# Patient Record
Sex: Male | Born: 1940 | Race: White | Hispanic: No | Marital: Married | Smoking: Former smoker
Health system: Southern US, Community
[De-identification: ages and names within clinical notes are randomized; demographics above are authoritative.]

## PROBLEM LIST (undated history)

## (undated) DIAGNOSIS — M1990 Unspecified osteoarthritis, unspecified site: Secondary | ICD-10-CM

## (undated) DIAGNOSIS — Z8739 Personal history of other diseases of the musculoskeletal system and connective tissue: Secondary | ICD-10-CM

## (undated) DIAGNOSIS — I10 Essential (primary) hypertension: Secondary | ICD-10-CM

## (undated) DIAGNOSIS — E785 Hyperlipidemia, unspecified: Secondary | ICD-10-CM

## (undated) DIAGNOSIS — E119 Type 2 diabetes mellitus without complications: Secondary | ICD-10-CM

## (undated) DIAGNOSIS — I4891 Unspecified atrial fibrillation: Secondary | ICD-10-CM

## (undated) HISTORY — DX: Hyperlipidemia, unspecified: E78.5

## (undated) HISTORY — DX: Personal history of other diseases of the musculoskeletal system and connective tissue: Z87.39

## (undated) HISTORY — DX: Unspecified osteoarthritis, unspecified site: M19.90

## (undated) HISTORY — PX: CHOLECYSTECTOMY: SHX55

## (undated) HISTORY — DX: Unspecified atrial fibrillation: I48.91

## (undated) HISTORY — PX: TONSILLECTOMY AND ADENOIDECTOMY: SUR1326

## (undated) HISTORY — PX: CERVICAL DISCECTOMY: SHX98

---

## 2016-01-22 DIAGNOSIS — E119 Type 2 diabetes mellitus without complications: Secondary | ICD-10-CM

## 2016-01-22 DIAGNOSIS — B351 Tinea unguium: Secondary | ICD-10-CM | POA: Insufficient documentation

## 2016-01-22 DIAGNOSIS — E1142 Type 2 diabetes mellitus with diabetic polyneuropathy: Secondary | ICD-10-CM

## 2016-05-23 DIAGNOSIS — I10 Essential (primary) hypertension: Secondary | ICD-10-CM | POA: Insufficient documentation

## 2016-05-23 DIAGNOSIS — M159 Polyosteoarthritis, unspecified: Secondary | ICD-10-CM | POA: Insufficient documentation

## 2016-05-23 DIAGNOSIS — E782 Mixed hyperlipidemia: Secondary | ICD-10-CM | POA: Insufficient documentation

## 2016-05-23 DIAGNOSIS — Z79899 Other long term (current) drug therapy: Secondary | ICD-10-CM | POA: Insufficient documentation

## 2016-05-23 DIAGNOSIS — M2041 Other hammer toe(s) (acquired), right foot: Secondary | ICD-10-CM | POA: Insufficient documentation

## 2016-05-23 DIAGNOSIS — G629 Polyneuropathy, unspecified: Secondary | ICD-10-CM | POA: Insufficient documentation

## 2016-05-23 DIAGNOSIS — M1A9XX0 Chronic gout, unspecified, without tophus (tophi): Secondary | ICD-10-CM | POA: Insufficient documentation

## 2016-05-23 DIAGNOSIS — E559 Vitamin D deficiency, unspecified: Secondary | ICD-10-CM | POA: Insufficient documentation

## 2018-12-12 DIAGNOSIS — E860 Dehydration: Secondary | ICD-10-CM | POA: Diagnosis not present

## 2018-12-12 DIAGNOSIS — R531 Weakness: Secondary | ICD-10-CM | POA: Diagnosis not present

## 2018-12-12 DIAGNOSIS — R739 Hyperglycemia, unspecified: Secondary | ICD-10-CM

## 2018-12-12 DIAGNOSIS — M5412 Radiculopathy, cervical region: Secondary | ICD-10-CM | POA: Diagnosis not present

## 2018-12-13 DIAGNOSIS — M5412 Radiculopathy, cervical region: Secondary | ICD-10-CM | POA: Diagnosis not present

## 2018-12-13 DIAGNOSIS — R531 Weakness: Secondary | ICD-10-CM | POA: Diagnosis not present

## 2018-12-13 DIAGNOSIS — R739 Hyperglycemia, unspecified: Secondary | ICD-10-CM | POA: Diagnosis not present

## 2018-12-13 DIAGNOSIS — E860 Dehydration: Secondary | ICD-10-CM | POA: Diagnosis not present

## 2018-12-14 DIAGNOSIS — R739 Hyperglycemia, unspecified: Secondary | ICD-10-CM | POA: Diagnosis not present

## 2018-12-14 DIAGNOSIS — R531 Weakness: Secondary | ICD-10-CM | POA: Diagnosis not present

## 2018-12-14 DIAGNOSIS — E860 Dehydration: Secondary | ICD-10-CM | POA: Diagnosis not present

## 2018-12-14 DIAGNOSIS — M5412 Radiculopathy, cervical region: Secondary | ICD-10-CM | POA: Diagnosis not present

## 2018-12-15 DIAGNOSIS — R531 Weakness: Secondary | ICD-10-CM | POA: Diagnosis not present

## 2018-12-15 DIAGNOSIS — R739 Hyperglycemia, unspecified: Secondary | ICD-10-CM | POA: Diagnosis not present

## 2018-12-15 DIAGNOSIS — M5412 Radiculopathy, cervical region: Secondary | ICD-10-CM | POA: Diagnosis not present

## 2018-12-15 DIAGNOSIS — E860 Dehydration: Secondary | ICD-10-CM | POA: Diagnosis not present

## 2018-12-16 DIAGNOSIS — M5412 Radiculopathy, cervical region: Secondary | ICD-10-CM | POA: Diagnosis not present

## 2018-12-16 DIAGNOSIS — E860 Dehydration: Secondary | ICD-10-CM | POA: Diagnosis not present

## 2018-12-16 DIAGNOSIS — R739 Hyperglycemia, unspecified: Secondary | ICD-10-CM | POA: Diagnosis not present

## 2018-12-16 DIAGNOSIS — R531 Weakness: Secondary | ICD-10-CM | POA: Diagnosis not present

## 2018-12-27 ENCOUNTER — Inpatient Hospital Stay (HOSPITAL_COMMUNITY)
Admission: EM | Admit: 2018-12-27 | Discharge: 2018-12-31 | DRG: 028 | Disposition: A | Discharge status: Skilled Nursing Facility | Payer: Medicare HMO | Source: Skilled Nursing Facility | Attending: Neurosurgery | Admitting: Neurosurgery

## 2018-12-27 ENCOUNTER — Encounter (HOSPITAL_COMMUNITY): Payer: Self-pay

## 2018-12-27 ENCOUNTER — Other Ambulatory Visit: Payer: Self-pay

## 2018-12-27 ENCOUNTER — Emergency Department (HOSPITAL_COMMUNITY): Payer: Medicare HMO | Admitting: Anesthesiology

## 2018-12-27 ENCOUNTER — Encounter (HOSPITAL_COMMUNITY)
Admission: EM | Disposition: A | Discharge status: Skilled Nursing Facility | Payer: Self-pay | Source: Skilled Nursing Facility | Attending: Neurosurgery

## 2018-12-27 ENCOUNTER — Emergency Department (HOSPITAL_COMMUNITY): Payer: Medicare HMO

## 2018-12-27 DIAGNOSIS — I10 Essential (primary) hypertension: Secondary | ICD-10-CM | POA: Diagnosis present

## 2018-12-27 DIAGNOSIS — W1830XA Fall on same level, unspecified, initial encounter: Secondary | ICD-10-CM | POA: Diagnosis present

## 2018-12-27 DIAGNOSIS — E1165 Type 2 diabetes mellitus with hyperglycemia: Secondary | ICD-10-CM | POA: Diagnosis present

## 2018-12-27 DIAGNOSIS — Z7984 Long term (current) use of oral hypoglycemic drugs: Secondary | ICD-10-CM

## 2018-12-27 DIAGNOSIS — Z7982 Long term (current) use of aspirin: Secondary | ICD-10-CM

## 2018-12-27 DIAGNOSIS — Z419 Encounter for procedure for purposes other than remedying health state, unspecified: Secondary | ICD-10-CM

## 2018-12-27 DIAGNOSIS — S14123A Central cord syndrome at C3 level of cervical spinal cord, initial encounter: Secondary | ICD-10-CM | POA: Diagnosis not present

## 2018-12-27 DIAGNOSIS — G8250 Quadriplegia, unspecified: Secondary | ICD-10-CM | POA: Diagnosis present

## 2018-12-27 DIAGNOSIS — Z791 Long term (current) use of non-steroidal anti-inflammatories (NSAID): Secondary | ICD-10-CM

## 2018-12-27 DIAGNOSIS — G959 Disease of spinal cord, unspecified: Secondary | ICD-10-CM | POA: Diagnosis present

## 2018-12-27 DIAGNOSIS — Z79899 Other long term (current) drug therapy: Secondary | ICD-10-CM

## 2018-12-27 DIAGNOSIS — M4802 Spinal stenosis, cervical region: Secondary | ICD-10-CM | POA: Diagnosis not present

## 2018-12-27 DIAGNOSIS — I480 Paroxysmal atrial fibrillation: Secondary | ICD-10-CM | POA: Diagnosis present

## 2018-12-27 DIAGNOSIS — R0602 Shortness of breath: Secondary | ICD-10-CM

## 2018-12-27 DIAGNOSIS — Z981 Arthrodesis status: Secondary | ICD-10-CM

## 2018-12-27 DIAGNOSIS — M48062 Spinal stenosis, lumbar region with neurogenic claudication: Secondary | ICD-10-CM | POA: Diagnosis present

## 2018-12-27 DIAGNOSIS — G2581 Restless legs syndrome: Secondary | ICD-10-CM | POA: Diagnosis present

## 2018-12-27 HISTORY — PX: POSTERIOR CERVICAL LAMINECTOMY: SHX2248

## 2018-12-27 HISTORY — DX: Type 2 diabetes mellitus without complications: E11.9

## 2018-12-27 HISTORY — DX: Essential (primary) hypertension: I10

## 2018-12-27 LAB — URINALYSIS, ROUTINE W REFLEX MICROSCOPIC
Bilirubin Urine: NEGATIVE
Glucose, UA: 50 mg/dL — AB
Hgb urine dipstick: NEGATIVE
Ketones, ur: 5 mg/dL — AB
Nitrite: NEGATIVE
Protein, ur: NEGATIVE mg/dL
Specific Gravity, Urine: 1.014 (ref 1.005–1.030)
pH: 5 (ref 5.0–8.0)

## 2018-12-27 LAB — BASIC METABOLIC PANEL
ANION GAP: 13 (ref 5–15)
BUN: 21 mg/dL (ref 8–23)
CALCIUM: 8.8 mg/dL — AB (ref 8.9–10.3)
CO2: 19 mmol/L — ABNORMAL LOW (ref 22–32)
Chloride: 104 mmol/L (ref 98–111)
Creatinine, Ser: 1 mg/dL (ref 0.61–1.24)
GFR calc Af Amer: >60 mL/min (ref >60)
GFR calc non Af Amer: >60 mL/min (ref >60)
Glucose, Bld: 273 mg/dL — ABNORMAL HIGH (ref 70–99)
Potassium: 3.5 mmol/L (ref 3.5–5.1)
Sodium: 136 mmol/L (ref 135–145)

## 2018-12-27 LAB — CBC
HCT: 38.8 % — ABNORMAL LOW (ref 39.0–52.0)
Hemoglobin: 12.2 g/dL — ABNORMAL LOW (ref 13.0–17.0)
MCH: 30 pg (ref 26.0–34.0)
MCHC: 31.4 g/dL (ref 30.0–36.0)
MCV: 95.6 fL (ref 80.0–100.0)
Platelets: 323 10*3/uL (ref 150–400)
RBC: 4.06 MIL/uL — ABNORMAL LOW (ref 4.22–5.81)
RDW: 12.4 % (ref 11.5–15.5)
WBC: 10.4 10*3/uL (ref 4.0–10.5)
nRBC: 0 % (ref 0.0–0.2)

## 2018-12-27 LAB — AMMONIA: Ammonia: 10 umol/L (ref 9–35)

## 2018-12-27 LAB — SEDIMENTATION RATE: Sed Rate: 55 mm/hr — ABNORMAL HIGH (ref 0–16)

## 2018-12-27 SURGERY — POSTERIOR CERVICAL LAMINECTOMY
Anesthesia: General | Site: Neck

## 2018-12-27 MED ORDER — SODIUM CHLORIDE 0.9% FLUSH
3.0000 mL | Freq: Once | INTRAVENOUS | Status: AC
  Administered 2018-12-27: 3 mL via INTRAVENOUS

## 2018-12-27 MED ORDER — LIDOCAINE 2% (20 MG/ML) 5 ML SYRINGE
INTRAMUSCULAR | Status: AC
  Filled 2018-12-27: qty 5

## 2018-12-27 MED ORDER — PROPOFOL 10 MG/ML IV BOLUS
INTRAVENOUS | Status: AC
  Filled 2018-12-27: qty 20

## 2018-12-27 MED ORDER — FENTANYL CITRATE (PF) 100 MCG/2ML IJ SOLN
INTRAMUSCULAR | Status: DC | PRN
  Administered 2018-12-27: 100 ug via INTRAVENOUS

## 2018-12-27 MED ORDER — THROMBIN 5000 UNITS EX SOLR
CUTANEOUS | Status: AC
  Filled 2018-12-27: qty 5000

## 2018-12-27 MED ORDER — ROCURONIUM BROMIDE 50 MG/5ML IV SOSY
PREFILLED_SYRINGE | INTRAVENOUS | Status: AC
  Filled 2018-12-27: qty 5

## 2018-12-27 MED ORDER — GADOBUTROL 1 MMOL/ML IV SOLN
10.0000 mL | Freq: Once | INTRAVENOUS | Status: AC | PRN
  Administered 2018-12-27: 10 mL via INTRAVENOUS

## 2018-12-27 MED ORDER — ROCURONIUM BROMIDE 50 MG/5ML IV SOSY
PREFILLED_SYRINGE | INTRAVENOUS | Status: DC | PRN
  Administered 2018-12-27: 50 mg via INTRAVENOUS
  Administered 2018-12-28: 20 mg via INTRAVENOUS
  Administered 2018-12-28: 30 mg via INTRAVENOUS

## 2018-12-27 MED ORDER — LIDOCAINE-EPINEPHRINE 1 %-1:100000 IJ SOLN
INTRAMUSCULAR | Status: AC
  Filled 2018-12-27: qty 1

## 2018-12-27 MED ORDER — LIDOCAINE HCL (CARDIAC) PF 100 MG/5ML IV SOSY
PREFILLED_SYRINGE | INTRAVENOUS | Status: DC | PRN
  Administered 2018-12-27: 60 mg via INTRAVENOUS

## 2018-12-27 MED ORDER — SUCCINYLCHOLINE CHLORIDE 200 MG/10ML IV SOSY
PREFILLED_SYRINGE | INTRAVENOUS | Status: AC
  Filled 2018-12-27: qty 10

## 2018-12-27 MED ORDER — BACITRACIN ZINC 500 UNIT/GM EX OINT
TOPICAL_OINTMENT | CUTANEOUS | Status: AC
  Filled 2018-12-27: qty 28.35

## 2018-12-27 MED ORDER — CEFAZOLIN SODIUM-DEXTROSE 2-3 GM-%(50ML) IV SOLR
INTRAVENOUS | Status: DC | PRN
  Administered 2018-12-27: 2 g via INTRAVENOUS

## 2018-12-27 MED ORDER — PHENYLEPHRINE HCL 10 MG/ML IJ SOLN
INTRAMUSCULAR | Status: DC | PRN
  Administered 2018-12-27 – 2018-12-28 (×8): 80 ug via INTRAVENOUS

## 2018-12-27 MED ORDER — PROPOFOL 10 MG/ML IV BOLUS
INTRAVENOUS | Status: DC | PRN
  Administered 2018-12-27: 90 mg via INTRAVENOUS
  Administered 2018-12-27: 30 mg via INTRAVENOUS

## 2018-12-27 MED ORDER — FENTANYL CITRATE (PF) 250 MCG/5ML IJ SOLN
INTRAMUSCULAR | Status: AC
  Filled 2018-12-27: qty 5

## 2018-12-27 MED ORDER — BUPIVACAINE HCL (PF) 0.25 % IJ SOLN
INTRAMUSCULAR | Status: AC
  Filled 2018-12-27: qty 30

## 2018-12-27 SURGICAL SUPPLY — 77 items
BAG DECANTER FOR FLEXI CONT (MISCELLANEOUS) ×3 IMPLANT
BENZOIN TINCTURE PRP APPL 2/3 (GAUZE/BANDAGES/DRESSINGS) ×3 IMPLANT
BIT DRILL 3.5 SHORT (BIT) ×2 IMPLANT
BIT DRILL 3.5MM SHORT (BIT) ×1
BLADE CLIPPER SURG (BLADE) ×3 IMPLANT
BLADE SURG 11 STRL SS (BLADE) ×3 IMPLANT
BONE VIVIGEN FORMABLE 10CC (Bone Implant) ×3 IMPLANT
BUR MATCHSTICK NEURO 3.0 LAGG (BURR) ×3 IMPLANT
CANISTER SUCT 3000ML PPV (MISCELLANEOUS) ×3 IMPLANT
CAP LOCKING (Cap) ×8 IMPLANT
CARTRIDGE OIL MAESTRO DRILL (MISCELLANEOUS) ×1 IMPLANT
CATH FOLEY 2WAY SLVR  5CC 12FR (CATHETERS) ×2
CATH FOLEY 2WAY SLVR 5CC 12FR (CATHETERS) ×1 IMPLANT
CLOSURE STERI-STRIP 1/2X4 (GAUZE/BANDAGES/DRESSINGS) ×1
CLOSURE WOUND 1/2 X4 (GAUZE/BANDAGES/DRESSINGS) ×1
CLSR STERI-STRIP ANTIMIC 1/2X4 (GAUZE/BANDAGES/DRESSINGS) ×2 IMPLANT
COVER WAND RF STERILE (DRAPES) ×3 IMPLANT
DECANTER SPIKE VIAL GLASS SM (MISCELLANEOUS) ×3 IMPLANT
DERMABOND ADVANCED (GAUZE/BANDAGES/DRESSINGS) ×2
DERMABOND ADVANCED .7 DNX12 (GAUZE/BANDAGES/DRESSINGS) ×1 IMPLANT
DIFFUSER DRILL AIR PNEUMATIC (MISCELLANEOUS) ×3 IMPLANT
DRAPE C-ARM 42X72 X-RAY (DRAPES) ×6 IMPLANT
DRAPE LAPAROTOMY 100X72 PEDS (DRAPES) ×3 IMPLANT
DRAPE MICROSCOPE LEICA (MISCELLANEOUS) IMPLANT
DRAPE SURG 17X23 STRL (DRAPES) ×3 IMPLANT
DRSG OPSITE POSTOP 4X6 (GAUZE/BANDAGES/DRESSINGS) ×3 IMPLANT
DURAPREP 26ML APPLICATOR (WOUND CARE) ×3 IMPLANT
ELECT REM PT RETURN 9FT ADLT (ELECTROSURGICAL) ×3
ELECTRODE REM PT RTRN 9FT ADLT (ELECTROSURGICAL) ×1 IMPLANT
EVACUATOR 1/8 PVC DRAIN (DRAIN) ×3 IMPLANT
GAUZE 4X4 16PLY RFD (DISPOSABLE) IMPLANT
GAUZE SPONGE 4X4 12PLY STRL (GAUZE/BANDAGES/DRESSINGS) ×3 IMPLANT
GLOVE BIO SURGEON STRL SZ 6.5 (GLOVE) ×2 IMPLANT
GLOVE BIO SURGEON STRL SZ7 (GLOVE) ×12 IMPLANT
GLOVE BIO SURGEON STRL SZ8 (GLOVE) ×3 IMPLANT
GLOVE BIO SURGEONS STRL SZ 6.5 (GLOVE) ×1
GLOVE BIOGEL PI IND STRL 7.0 (GLOVE) IMPLANT
GLOVE BIOGEL PI IND STRL 7.5 (GLOVE) ×2 IMPLANT
GLOVE BIOGEL PI INDICATOR 7.0 (GLOVE)
GLOVE BIOGEL PI INDICATOR 7.5 (GLOVE) ×4
GLOVE EXAM NITRILE XL STR (GLOVE) IMPLANT
GLOVE INDICATOR 8.5 STRL (GLOVE) ×3 IMPLANT
GOWN STRL REUS W/ TWL LRG LVL3 (GOWN DISPOSABLE) IMPLANT
GOWN STRL REUS W/ TWL XL LVL3 (GOWN DISPOSABLE) ×1 IMPLANT
GOWN STRL REUS W/TWL 2XL LVL3 (GOWN DISPOSABLE) IMPLANT
GOWN STRL REUS W/TWL LRG LVL3 (GOWN DISPOSABLE)
GOWN STRL REUS W/TWL XL LVL3 (GOWN DISPOSABLE) ×2
IMPL QUARTEX 3.5X14MM (Neuro Prosthesis/Implant) ×8 IMPLANT
IMPLANT QUARTEX 3.5X14MM (Neuro Prosthesis/Implant) ×24 IMPLANT
KIT BASIN OR (CUSTOM PROCEDURE TRAY) ×3 IMPLANT
KIT INFUSE X SMALL 1.4CC (Orthopedic Implant) ×3 IMPLANT
KIT TURNOVER KIT B (KITS) ×3 IMPLANT
LOCKING CAP (Cap) ×24 IMPLANT
MARKER SKIN DUAL TIP RULER LAB (MISCELLANEOUS) ×3 IMPLANT
NEEDLE HYPO 25X1 1.5 SAFETY (NEEDLE) ×3 IMPLANT
NEEDLE SPNL 20GX3.5 QUINCKE YW (NEEDLE) ×3 IMPLANT
NS IRRIG 1000ML POUR BTL (IV SOLUTION) ×3 IMPLANT
OIL CARTRIDGE MAESTRO DRILL (MISCELLANEOUS) ×3
PACK LAMINECTOMY NEURO (CUSTOM PROCEDURE TRAY) ×3 IMPLANT
PAD ARMBOARD 7.5X6 YLW CONV (MISCELLANEOUS) ×9 IMPLANT
PIN MAYFIELD SKULL DISP (PIN) ×3 IMPLANT
ROD SPINE CVD 3.5X60MM (Rod) ×6 IMPLANT
RUBBERBAND STERILE (MISCELLANEOUS) IMPLANT
SPONGE LAP 4X18 RFD (DISPOSABLE) IMPLANT
SPONGE SURGIFOAM ABS GEL SZ50 (HEMOSTASIS) ×3 IMPLANT
STRIP CLOSURE SKIN 1/2X4 (GAUZE/BANDAGES/DRESSINGS) ×2 IMPLANT
SUT ETHILON 4 0 PS 2 18 (SUTURE) ×3 IMPLANT
SUT VIC AB 0 CT1 18XCR BRD8 (SUTURE) ×1 IMPLANT
SUT VIC AB 0 CT1 8-18 (SUTURE) ×2
SUT VIC AB 2-0 CT1 18 (SUTURE) ×3 IMPLANT
SUT VICRYL 4-0 PS2 18IN ABS (SUTURE) ×3 IMPLANT
TOWEL GREEN STERILE (TOWEL DISPOSABLE) ×3 IMPLANT
TOWEL GREEN STERILE FF (TOWEL DISPOSABLE) ×3 IMPLANT
TRAY FOLEY MTR SLVR 16FR STAT (SET/KITS/TRAYS/PACK) ×3 IMPLANT
TUBE CONNECTING 20'X1/4 (TUBING) ×1
TUBE CONNECTING 20X1/4 (TUBING) ×2 IMPLANT
WATER STERILE IRR 1000ML POUR (IV SOLUTION) ×3 IMPLANT

## 2018-12-27 NOTE — H&P (Signed)
Ralph Griffin is an 78 y.o. male.   Chief Complaint: progressive quadriparesis following a fall late January HPI: 78 year old gentleman who underwent an ACDF from C3-C7 back in early November. Did very well until mid late January sustained a fall and since then his Progressively weaker. Yet she was admitted for weakness in the beginning of February to [facility] worked up extensively and transferred to rehabilitation. Patient has been followed up by his spine surgeon and workup has included brain MRI and CT scans have not shown any significant structural pathology. Patient is continue with rehabilitation however over the last 9 days his exam has been stable and he's been nonambulatory with significant weakness in his distal upper extremities and his lower extremities.  Past Medical History:  Diagnosis Date  . Diabetes mellitus without complication ([place])   . Hypertension     Past Surgical History:  Procedure Laterality Date  . CERVICAL DISCECTOMY      History reviewed. No pertinent family history. Social History:  reports that he has never smoked. He has never used smokeless tobacco. He reports previous alcohol use. No history on file for drug.  Allergies: No Known Allergies  (Not in a hospital admission)   Results for orders placed or performed during the hospital encounter of 12/27/18 (from the past 48 hour(s))  Basic metabolic panel     Status: Abnormal   Collection Time: 12/27/18  1:50 PM  Result Value Ref Range   Sodium 136 135 - 145 mmol/L   Potassium 3.5 3.5 - 5.1 mmol/L   Chloride 104 98 - 111 mmol/L   CO2 19 (L) 22 - 32 mmol/L   Glucose, Bld 273 (H) 70 - 99 mg/dL   BUN 21 8 - 23 mg/dL   Creatinine, Ser 1.00 0.61 - 1.24 mg/dL   Calcium 8.8 (L) 8.9 - 10.3 mg/dL   GFR calc non Af Amer >60 >60 mL/min   GFR calc Af Amer >60 >60 mL/min   Anion gap 13 5 - 15    Comment: Performed at [facility], [place] [address], [place], [place] [address]  CBC     Status:  Abnormal   Collection Time: 12/27/18  1:50 PM  Result Value Ref Range   WBC 10.4 4.0 - 10.5 K/uL   RBC 4.06 (L) 4.22 - 5.81 MIL/uL   Hemoglobin 12.2 (L) 13.0 - 17.0 g/dL   HCT 38.8 (L) 39.0 - 52.0 %   MCV 95.6 80.0 - 100.0 fL   MCH 30.0 26.0 - 34.0 pg   MCHC 31.4 30.0 - 36.0 g/dL   RDW 12.4 11.5 - 15.5 %   Platelets 323 150 - 400 K/uL   nRBC 0.0 0.0 - 0.2 %    Comment: Performed at [facility], [place] [address] [place], [place] [address]  Sedimentation rate     Status: Abnormal   Collection Time: 12/27/18  4:22 PM  Result Value Ref Range   Sed Rate 55 (H) 0 - 16 mm/hr    Comment: Performed at [facility] [address] [place], [place] [address]  Ammonia     Status: None   Collection Time: 12/27/18  5:02 PM  Result Value Ref Range   Ammonia 10 9 - 35 umol/L    Comment: Performed at [facility], [place] [address], [place], [place] [address]  Urinalysis, Routine w reflex microscopic     Status: Abnormal   Collection Time: 12/27/18  6:50 PM  Result  Value Ref Range   Color, Urine YELLOW YELLOW   APPearance HAZY (A) CLEAR   Specific Gravity, Urine 1.014 1.005 - 1.030   pH 5.0 5.0 - 8.0   Glucose, UA 50 (A) NEGATIVE mg/dL   Hgb urine dipstick NEGATIVE NEGATIVE   Bilirubin Urine NEGATIVE NEGATIVE   Ketones, ur 5 (A) NEGATIVE mg/dL   Protein, ur NEGATIVE NEGATIVE mg/dL   Nitrite NEGATIVE NEGATIVE   Leukocytes,Ua MODERATE (A) NEGATIVE   RBC / HPF 6-10 0 - 5 RBC/hpf   WBC, UA 11-20 0 - 5 WBC/hpf   Bacteria, UA RARE (A) NONE SEEN   Squamous Epithelial / LPF 0-5 0 - 5   Mucus PRESENT    Hyaline Casts, UA PRESENT     Comment: Performed at [facility] [address] [place], [place] [address]   Mr Cervical Spine W Or Wo Contrast  Result Date: 12/27/2018 CLINICAL DATA:  Initial evaluation for acute and progressive myelopathy. EXAM: MRI CERVICAL AND THORACIC SPINE WITHOUT AND WITH CONTRAST TECHNIQUE: Multiplanar and multiecho pulse sequences of the cervical  spine, to include the craniocervical junction and cervicothoracic junction, and the thoracic spine, were obtained without and with intravenous contrast. CONTRAST:  10 cc of Gadavist. COMPARISON:  None available. FINDINGS: MRI CERVICAL SPINE FINDINGS Alignment: Vertebral bodies normally aligned with preservation of the normal cervical lordosis. No listhesis. Vertebrae: Susceptibility artifact from prior ACDF at C3 through C6. Vertebral body heights maintained without evidence for acute or chronic fracture. Bone marrow signal intensity within normal limits. No discrete or worrisome osseous lesions. Reactive marrow edema with mild enhancement about the right greater than left C4-5 facets, most likely due to facet arthritis. Cord: Evaluation of the cervical spinal cord somewhat limited by body habitus and motion artifact vague T2 signal abnormality within the cervical spinal cord at the level of C3-4, suspicious for edema and/or myelomalacia (series 4, image 10). Possible postcontrast enhancement, although difficult to be certain given adjacent susceptibility artifact from fusion hardware. Signal intensity within the cervical spinal cord otherwise within normal limits. Posterior Fossa, vertebral arteries, paraspinal tissues: Visualized brain and posterior fossa within normal limits. Craniocervical junction normal. Paraspinous and prevertebral soft tissues normal. Normal intravascular flow voids seen within the vertebral arteries bilaterally. Disc levels: C2-C3: Mild disc bulge. Right greater than left uncovertebral hypertrophy. Prominent right-sided facet degeneration. Resultant mild spinal stenosis without cord deformity. Severe right with mild left C3 foraminal narrowing. C3-C4: Prior fusion. Broad-based posterior osseous ridging flattens and effaces the ventral thecal sac. Superimposed facet and ligamentum flavum hypertrophy. Resultant severe spinal stenosis with compression of the cervical spinal cord. Thecal sac  measures approximately 5 mm in AP diameter at its most narrow point. Severe bilateral C4 foraminal stenosis. C4-C5: Prior fusion. Small amount of enhancing T2 hyperintense signal intensity at the right ventral epidural space suspected to reflect postoperative granulation tissue (series 4, image 12). Facet and ligament flavum hypertrophy. Residual mild to moderate spinal stenosis without cord compression. Residual uncovertebral spurring with resultant severe bilateral C5 foraminal stenosis. C5-C6: Prior fusion. Residual posterior osseous ridging flattens and effaces the ventral thecal sac. Residual mild moderate spinal stenosis without cord compression. Severe bilateral C6 foraminal stenosis. C6-C7: Right eccentric disc osteophyte with intervertebral disc space narrowing, with right greater than left uncovertebral hypertrophy. Flattening of the ventral thecal sac without significant spinal stenosis. Moderate right with mild left C7 foraminal narrowing. C7-T1: Mild disc bulge with facet hypertrophy. No significant spinal stenosis. Foramina are grossly patent. MRI THORACIC SPINE  FINDINGS Alignment: Mild exaggeration of the normal thoracic kyphosis. No listhesis or malalignment. Vertebrae: Vertebral body heights maintained without evidence for acute or chronic fracture. Bone marrow signal intensity within normal limits. Multiple scattered benign hemangiomas noted throughout the thoracic spine, most prominent of which measures 2.3 cm at the L1 vertebral body. No worrisome osseous lesions. No abnormal marrow edema or enhancement. Cord: Signal intensity within the thoracic spinal cord is normal. Normal cord caliber morphology. Conus medullaris terminates at approximately the L1 level. Paraspinal and other soft tissues: Paraspinous soft tissues demonstrate no acute finding. Partially visualized lungs are grossly clear. Visualized visceral structures unremarkable. Disc levels: Minimal disc bulge noted at T7-8 without  significant stenosis. Mild scattered posterior element hypertrophy noted within the lower thoracic spine. No other significant degenerative changes identified. No canal or neural foraminal stenosis. No impingement. IMPRESSION: MRI CERVICAL SPINE IMPRESSION 1. Multifactorial degenerative changes at C3-4 with resultant severe spinal stenosis with cord impingement. Subtle T2 signal abnormality within the cord at this level suspicious for edema and/or myelomalacia. 2. Prior ACDF at C3-C6, with residual mild to moderate diffuse spinal stenosis elsewhere at these levels. 3. Multifactorial degenerative changes with resultant severe right C3 foraminal stenosis, with severe bilateral C4 through C6 foraminal narrowing, and moderate right C7 foraminal stenosis. MRI THORACIC SPINE IMPRESSION No acute abnormality within the thoracic spine. No significant stenosis or evidence for cord compression. Critical Value/emergent results were called by telephone at the time of interpretation on 12/27/2018 at 9:25 pm to Dr. Noemi Chapel , who verbally acknowledged these results. Electronically Signed   By: Jeannine Boga M.D.   On: 12/27/2018 21:29   Mr Thoracic Spine W Wo Contrast  Result Date: 12/27/2018 CLINICAL DATA:  Initial evaluation for acute and progressive myelopathy. EXAM: MRI CERVICAL AND THORACIC SPINE WITHOUT AND WITH CONTRAST TECHNIQUE: Multiplanar and multiecho pulse sequences of the cervical spine, to include the craniocervical junction and cervicothoracic junction, and the thoracic spine, were obtained without and with intravenous contrast. CONTRAST:  10 cc of Gadavist. COMPARISON:  None available. FINDINGS: MRI CERVICAL SPINE FINDINGS Alignment: Vertebral bodies normally aligned with preservation of the normal cervical lordosis. No listhesis. Vertebrae: Susceptibility artifact from prior ACDF at C3 through C6. Vertebral body heights maintained without evidence for acute or chronic fracture. Bone marrow signal  intensity within normal limits. No discrete or worrisome osseous lesions. Reactive marrow edema with mild enhancement about the right greater than left C4-5 facets, most likely due to facet arthritis. Cord: Evaluation of the cervical spinal cord somewhat limited by body habitus and motion artifact vague T2 signal abnormality within the cervical spinal cord at the level of C3-4, suspicious for edema and/or myelomalacia (series 4, image 10). Possible postcontrast enhancement, although difficult to be certain given adjacent susceptibility artifact from fusion hardware. Signal intensity within the cervical spinal cord otherwise within normal limits. Posterior Fossa, vertebral arteries, paraspinal tissues: Visualized brain and posterior fossa within normal limits. Craniocervical junction normal. Paraspinous and prevertebral soft tissues normal. Normal intravascular flow voids seen within the vertebral arteries bilaterally. Disc levels: C2-C3: Mild disc bulge. Right greater than left uncovertebral hypertrophy. Prominent right-sided facet degeneration. Resultant mild spinal stenosis without cord deformity. Severe right with mild left C3 foraminal narrowing. C3-C4: Prior fusion. Broad-based posterior osseous ridging flattens and effaces the ventral thecal sac. Superimposed facet and ligamentum flavum hypertrophy. Resultant severe spinal stenosis with compression of the cervical spinal cord. Thecal sac measures approximately 5 mm in AP diameter at its most narrow point. Severe bilateral  C4 foraminal stenosis. C4-C5: Prior fusion. Small amount of enhancing T2 hyperintense signal intensity at the right ventral epidural space suspected to reflect postoperative granulation tissue (series 4, image 12). Facet and ligament flavum hypertrophy. Residual mild to moderate spinal stenosis without cord compression. Residual uncovertebral spurring with resultant severe bilateral C5 foraminal stenosis. C5-C6: Prior fusion. Residual  posterior osseous ridging flattens and effaces the ventral thecal sac. Residual mild moderate spinal stenosis without cord compression. Severe bilateral C6 foraminal stenosis. C6-C7: Right eccentric disc osteophyte with intervertebral disc space narrowing, with right greater than left uncovertebral hypertrophy. Flattening of the ventral thecal sac without significant spinal stenosis. Moderate right with mild left C7 foraminal narrowing. C7-T1: Mild disc bulge with facet hypertrophy. No significant spinal stenosis. Foramina are grossly patent. MRI THORACIC SPINE FINDINGS Alignment: Mild exaggeration of the normal thoracic kyphosis. No listhesis or malalignment. Vertebrae: Vertebral body heights maintained without evidence for acute or chronic fracture. Bone marrow signal intensity within normal limits. Multiple scattered benign hemangiomas noted throughout the thoracic spine, most prominent of which measures 2.3 cm at the L1 vertebral body. No worrisome osseous lesions. No abnormal marrow edema or enhancement. Cord: Signal intensity within the thoracic spinal cord is normal. Normal cord caliber morphology. Conus medullaris terminates at approximately the L1 level. Paraspinal and other soft tissues: Paraspinous soft tissues demonstrate no acute finding. Partially visualized lungs are grossly clear. Visualized visceral structures unremarkable. Disc levels: Minimal disc bulge noted at T7-8 without significant stenosis. Mild scattered posterior element hypertrophy noted within the lower thoracic spine. No other significant degenerative changes identified. No canal or neural foraminal stenosis. No impingement. IMPRESSION: MRI CERVICAL SPINE IMPRESSION 1. Multifactorial degenerative changes at C3-4 with resultant severe spinal stenosis with cord impingement. Subtle T2 signal abnormality within the cord at this level suspicious for edema and/or myelomalacia. 2. Prior ACDF at C3-C6, with residual mild to moderate diffuse  spinal stenosis elsewhere at these levels. 3. Multifactorial degenerative changes with resultant severe right C3 foraminal stenosis, with severe bilateral C4 through C6 foraminal narrowing, and moderate right C7 foraminal stenosis. MRI THORACIC SPINE IMPRESSION No acute abnormality within the thoracic spine. No significant stenosis or evidence for cord compression. Critical Value/emergent results were called by telephone at the time of interpretation on 12/27/2018 at 9:25 pm to Dr. Noemi Chapel , who verbally acknowledged these results. Electronically Signed   By: Jeannine Boga M.D.   On: 12/27/2018 21:29    Review of Systems  Musculoskeletal: Positive for neck pain.  Neurological: Positive for sensory change and weakness.    Blood pressure 132/84, pulse 97, temperature 97.7 F (36.5 C), temperature source Oral, resp. rate 14, SpO2 100 %. Physical Exam  Constitutional: He is oriented to person, place, and time. He appears well-developed and well-nourished.  HENT:  Head: Normocephalic.  Eyes: Pupils are equal, round, and reactive to light.  Neck: Normal range of motion.  Respiratory: Effort normal.  GI: Soft.  Neurological: He is alert and oriented to person, place, and time. He has normal strength. GCS eye subscore is 4. GCS verbal subscore is 5. GCS motor subscore is 6.  Dense quadriparesis upper extremities left upper extremity is the weakest with 4- bicep 4 out of 5 triceps 1-2 grip in the left. The right upper extremity has a week 2-3 grip bicep is 4 triceps 4.lower extremities are antigravity at 4 minus to 4 iliopsoas, quads, hamstrings dorsiflexion's weak bilaterally at about 3 out of 5. 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 MRI of cervical spine is very difficult to interpret due to the artifact  from the anterior construct but I think the vast majority of compression is now happening posteriorly. There does appear to be some anterior compression on the T2 use however this looks less significant on the restroom images and other sequences and possibly could be related to artifact there was nothing on the CT scan he had a Oval Linsey that look like a fracture displacement of bone or graft there may have been some small amount of residual spurring on the right. Due to the high risk of reexploring previously operated level this June out after his anterior cervical approach and a 78 year old and due to the fact that think most of the compression now resides posteriorly I recommended posterior decompression and fusion. I've extensively gone over the risks and benefits of the procedure with him as well as perioperative course expectations of outcome and alternatives surgery and he understands and agrees to proceed forward.  Staley Lunz P, MD 12/27/2018, 10:41 PM

## 2018-12-27 NOTE — Anesthesia Preprocedure Evaluation (Addendum)
Anesthesia Evaluation  Patient identified by MRN, date of birth, ID band Patient awake    Reviewed: Allergy & Precautions, NPO status , Patient's Chart, lab work & pertinent test results, reviewed documented beta blocker date and time   History of Anesthesia Complications Negative for: history of anesthetic complications  Airway Mallampati: I  TM Distance: >3 FB Neck ROM: Limited    Dental  (+) Edentulous Upper, Edentulous Lower   Pulmonary neg pulmonary ROS,    breath sounds clear to auscultation       Cardiovascular hypertension, Pt. on medications and Pt. on home beta blockers (-) angina(-) Past MI and (-) CHF + dysrhythmias Atrial Fibrillation  Rhythm:Irregular     Neuro/Psych Progressive upper/lower extremity weakness after a fall s/p ACDF 11/19 negative psych ROS   GI/Hepatic Neg liver ROS,   Endo/Other  diabetes, Type 2Morbid obesity  Renal/GU negative Renal ROS     Musculoskeletal negative musculoskeletal ROS (+)   Abdominal   Peds  Hematology negative hematology ROS (+)   Anesthesia Other Findings   Reproductive/Obstetrics                            Anesthesia Physical Anesthesia Plan  ASA: III and emergent  Anesthesia Plan: General   Post-op Pain Management:    Induction: Intravenous  PONV Risk Score and Plan: 2 and Ondansetron and Dexamethasone  Airway Management Planned: Oral ETT  Additional Equipment: None  Intra-op Plan:   Post-operative Plan: Extubation in OR  Informed Consent: I have reviewed the patients History and Physical, chart, labs and discussed the procedure including the risks, benefits and alternatives for the proposed anesthesia with the patient or authorized representative who has indicated his/her understanding and acceptance.     Dental advisory given  Plan Discussed with: CRNA and Surgeon  Anesthesia Plan Comments:         Anesthesia  Quick Evaluation

## 2018-12-27 NOTE — Anesthesia Procedure Notes (Signed)
Procedure Name: Intubation Date/Time: 12/27/2018 11:34 PM Performed by: Purvis Kilts, CRNA Pre-anesthesia Checklist: Patient identified, Emergency Drugs available, Suction available, Patient being monitored and Timeout performed Patient Re-evaluated:Patient Re-evaluated prior to induction Oxygen Delivery Method: Circle system utilized Preoxygenation: Pre-oxygenation with 100% oxygen Induction Type: IV induction Laryngoscope Size: Glidescope Tube type: Oral Tube size: 7.0 mm Number of attempts: 1 Airway Equipment and Method: Stylet Placement Confirmation: ETT inserted through vocal cords under direct vision,  positive ETCO2 and breath sounds checked- equal and bilateral Secured at: 22 cm Tube secured with: Tape Dental Injury: Teeth and Oropharynx as per pre-operative assessment

## 2018-12-27 NOTE — ED Notes (Signed)
Patient transported to MRI 

## 2018-12-27 NOTE — ED Triage Notes (Addendum)
Pt had cervical spine dissectomy in November. He had a fall in January and has gotten progressively weaker since. He had recent hospital stay and was told scans were negative and he was sent to rehab facility. Pt has progressively lost use of his hands. Pt last walked 2/5 and is now having to use a hoyer lift. Pt is alert and oriented to his baseline per daughter. Due to patient symptoms family would like to see neurologist.

## 2018-12-27 NOTE — ED Provider Notes (Signed)
[facility] EMERGENCY DEPARTMENT Provider Note   CSN: [id number] Arrival date & time: 12/27/18  1256    History   Chief Complaint Chief Complaint  Patient presents with  . Failure To Thrive    HPI Ralph Griffin is a 78 y.o. male.     HPI  The patient is a 78 year old male, this unfortunate patient suffered a fall on December 09, 2018 approximately 6 weeks after having an anterior cervical discectomy and fusion of C3-C6.  Initially the patient was healing very well, he was using his entire body ambulatory and driving a car.  After the fall the patient had to crawl across the floor in an attempt to get on the bed, fire and rescue was called to assist him onto the bed.  On 1 February he became more weak and having some involuntary movement of his legs but was still able to stand.  Later that day he went to [facility] and was there until December 16, 2018 but progressive weakness was noted by the family members.  From there he was sent to a rehab facility at [facility] rehab at that time was able to stand and pivot with minimal assistance and was feeding himself, by the eighth he became very weak in both the arms and the legs and started to have more involuntary jerking.  On the ninth it was noted that he was needed to have a Hoyer lift for transporting from a chair to a bed as he was not having any use of his hands.  On the 11th the patient was seen by Dr. Donivan Scull, his orthopedic surgeon who did the cervical spine surgery who wanted him to continue with rehabilitation and physical therapy.  Since then he has had some bowel incontinence, bladder incontinence, difficulty urinating with more involuntary movements.  On 14 February he had an MRI of his brain and was sent back to rehab stating that the MRI  was normal.  The patient is brought in by his family members who are in town from [place] to have recognized his rapid decline and feel that he cannot wait another 6 weeks to be seen by the neurologist in [facility] where he was referred by the orthopedic surgeon.  There has been no fevers chest pain coughing shortness of breath and the patient has no pain in the arms or legs.  He is frustrated in that he has become so weak he can do absolutely nothing for himself.  Past Medical History:  Diagnosis Date  . Diabetes mellitus without complication ([place])   . Hypertension     There are no active problems to display for this patient.   Past Surgical History:  Procedure Laterality Date  . CERVICAL DISCECTOMY          Home Medications    Prior to Admission medications   Medication Sig Start Date End Date Taking? Authorizing Provider  allopurinol (ZYLOPRIM) 100 MG tablet Take 100 mg by mouth daily. 11/01/18  Yes [provider]  aspirin EC 81 MG tablet Take 81 mg by mouth daily.   Yes [provider]  atorvastatin (LIPITOR) 20 MG tablet Take 1 tablet by mouth at bedtime. 10/19/18  Yes [provider]  carvedilol (COREG) 12.5 MG tablet Take 1 tablet by mouth 2 (two) times daily. 11/01/18  Yes [provider]  cholecalciferol (VITAMIN D3) 25 MCG (1000 UT) tablet Take 2,000 Units by mouth daily.   Yes [provider]  diclofenac (VOLTAREN) 75  MG EC tablet Take 1 tablet by mouth 2 (two) times daily as needed for pain. 11/30/18  Yes [provider]  gabapentin (NEURONTIN) 100 MG capsule Take 100 mg by mouth 3 (three) times daily.   Yes [provider]  glipiZIDE (GLUCOTROL XL) 10 MG 24 hr tablet Take 1 tablet by mouth 2 (two) times daily. 11/01/18  Yes [provider]  hydrochlorothiazide (HYDRODIURIL) 25 MG tablet Take 25 mg by mouth daily. 11/01/18  Yes [provider]  metFORMIN (GLUCOPHAGE-XR) 500 MG 24 hr  tablet Take 1,000 mg by mouth 2 (two) times daily. 11/01/18  Yes [provider]  Multiple Vitamin (MULTIVITAMIN WITH MINERALS) TABS tablet Take 1 tablet by mouth daily.   Yes [provider]  OS-CAL CALCIUM + D3 500-200 MG-UNIT TABS Take 1 tablet by mouth every morning. 09/24/18  Yes [provider]  VICTOZA 18 MG/3ML SOPN Inject 1.2 mg into the skin every morning.  11/17/18  Yes [provider]  vitamin B-12 (CYANOCOBALAMIN) 1000 MCG tablet Take 1,000 mcg by mouth daily.   Yes [provider]    Family History History reviewed. No pertinent family history.  Social History Social History   Tobacco Use  . Smoking status: Never Smoker  . Smokeless tobacco: Never Used  Substance Use Topics  . Alcohol use: Not Currently  . Drug use: Not on file     Allergies   Patient has no known allergies.   Review of Systems Review of Systems  All other systems reviewed and are negative.    Physical Exam Updated Vital Signs BP 132/84   Pulse 97   Temp 97.7 F (36.5 C) (Oral)   Resp 14   SpO2 100%   Physical Exam Vitals signs and nursing note reviewed.  Constitutional:      General: He is not in acute distress.    Appearance: He is well-developed.  HENT:     Head: Normocephalic and atraumatic.     Mouth/Throat:     Pharynx: No oropharyngeal exudate.  Eyes:     General: No scleral icterus.       Right eye: No discharge.        Left eye: No discharge.     Conjunctiva/sclera: Conjunctivae normal.     Pupils: Pupils are equal, round, and reactive to light.  Neck:     Musculoskeletal: Normal range of motion and neck supple.     Thyroid: No thyromegaly.     Vascular: No JVD.  Cardiovascular:     Rate and Rhythm: Normal rate and regular rhythm.     Heart sounds: Normal heart sounds. No murmur. No friction rub. No gallop.   Pulmonary:     Effort: Pulmonary effort is normal. No respiratory distress.     Breath sounds: Normal breath  sounds. No wheezing or rales.  Abdominal:     General: Bowel sounds are normal. There is no distension.     Palpations: Abdomen is soft. There is no mass.     Tenderness: There is no abdominal tenderness.  Musculoskeletal: Normal range of motion.        General: No tenderness.  Lymphadenopathy:     Cervical: No cervical adenopathy.  Skin:    General: Skin is warm and dry.     Findings: No erythema or rash.  Neurological:     Mental Status: He is alert.     Coordination: Coordination normal.     Comments: The patient is diffusely weak, he  is able to lift both arms off the bed but has almost no coordination, his weakness is severe, both legs are so severely weak that he can barely move his feet at the ankles.  He is between 3 and 4 out of 5 strength diffusely.  His speech is normal, he is able to lift his head off the bed without any difficulty and move from side to side, he has normal vision in speech and memory.  Psychiatric:        Behavior: Behavior normal.      ED Treatments / Results  Labs (all labs ordered are listed, but only abnormal results are displayed) Labs Reviewed  BASIC METABOLIC PANEL - Abnormal; Notable for the following components:      Result Value   CO2 19 (*)    Glucose, Bld 273 (*)    Calcium 8.8 (*)    All other components within normal limits  CBC - Abnormal; Notable for the following components:   RBC 4.06 (*)    Hemoglobin 12.2 (*)    HCT 38.8 (*)    All other components within normal limits  SEDIMENTATION RATE - Abnormal; Notable for the following components:   Sed Rate 55 (*)    All other components within normal limits  URINALYSIS, ROUTINE W REFLEX MICROSCOPIC - Abnormal; Notable for the following components:   APPearance HAZY (*)    Glucose, UA 50 (*)    Ketones, ur 5 (*)    Leukocytes,Ua MODERATE (*)    Bacteria, UA RARE (*)    All other components within normal limits  URINE CULTURE  AMMONIA    EKG None  Radiology Mr Cervical Spine  W Or Wo Contrast  Result Date: 12/27/2018 CLINICAL DATA:  Initial evaluation for acute and progressive myelopathy. EXAM: MRI CERVICAL AND THORACIC SPINE WITHOUT AND WITH CONTRAST TECHNIQUE: Multiplanar and multiecho pulse sequences of the cervical spine, to include the craniocervical junction and cervicothoracic junction, and the thoracic spine, were obtained without and with intravenous contrast. CONTRAST:  10 cc of Gadavist. COMPARISON:  None available. FINDINGS: MRI CERVICAL SPINE FINDINGS Alignment: Vertebral bodies normally aligned with preservation of the normal cervical lordosis. No listhesis. Vertebrae: Susceptibility artifact from prior ACDF at C3 through C6. Vertebral body heights maintained without evidence for acute or chronic fracture. Bone marrow signal intensity within normal limits. No discrete or worrisome osseous lesions. Reactive marrow edema with mild enhancement about the right greater than left C4-5 facets, most likely due to facet arthritis. Cord: Evaluation of the cervical spinal cord somewhat limited by body habitus and motion artifact vague T2 signal abnormality within the cervical spinal cord at the level of C3-4, suspicious for edema and/or myelomalacia (series 4, image 10). Possible postcontrast enhancement, although difficult to be certain given adjacent susceptibility artifact from fusion hardware. Signal intensity within the cervical spinal cord otherwise within normal limits. Posterior Fossa, vertebral arteries, paraspinal tissues: Visualized brain and posterior fossa within normal limits. Craniocervical junction normal. Paraspinous and prevertebral soft tissues normal. Normal intravascular flow voids seen within the vertebral arteries bilaterally. Disc levels: C2-C3: Mild disc bulge. Right greater than left uncovertebral hypertrophy. Prominent right-sided facet degeneration. Resultant mild spinal stenosis without cord deformity. Severe right with mild left C3 foraminal narrowing.  C3-C4: Prior fusion. Broad-based posterior osseous ridging flattens and effaces the ventral thecal sac. Superimposed facet and ligamentum flavum hypertrophy. Resultant severe spinal stenosis with compression of the cervical spinal cord. Thecal sac measures approximately 5 mm in AP diameter at its  most narrow point. Severe bilateral C4 foraminal stenosis. C4-C5: Prior fusion. Small amount of enhancing T2 hyperintense signal intensity at the right ventral epidural space suspected to reflect postoperative granulation tissue (series 4, image 12). Facet and ligament flavum hypertrophy. Residual mild to moderate spinal stenosis without cord compression. Residual uncovertebral spurring with resultant severe bilateral C5 foraminal stenosis. C5-C6: Prior fusion. Residual posterior osseous ridging flattens and effaces the ventral thecal sac. Residual mild moderate spinal stenosis without cord compression. Severe bilateral C6 foraminal stenosis. C6-C7: Right eccentric disc osteophyte with intervertebral disc space narrowing, with right greater than left uncovertebral hypertrophy. Flattening of the ventral thecal sac without significant spinal stenosis. Moderate right with mild left C7 foraminal narrowing. C7-T1: Mild disc bulge with facet hypertrophy. No significant spinal stenosis. Foramina are grossly patent. MRI THORACIC SPINE FINDINGS Alignment: Mild exaggeration of the normal thoracic kyphosis. No listhesis or malalignment. Vertebrae: Vertebral body heights maintained without evidence for acute or chronic fracture. Bone marrow signal intensity within normal limits. Multiple scattered benign hemangiomas noted throughout the thoracic spine, most prominent of which measures 2.3 cm at the L1 vertebral body. No worrisome osseous lesions. No abnormal marrow edema or enhancement. Cord: Signal intensity within the thoracic spinal cord is normal. Normal cord caliber morphology. Conus medullaris terminates at approximately the L1  level. Paraspinal and other soft tissues: Paraspinous soft tissues demonstrate no acute finding. Partially visualized lungs are grossly clear. Visualized visceral structures unremarkable. Disc levels: Minimal disc bulge noted at T7-8 without significant stenosis. Mild scattered posterior element hypertrophy noted within the lower thoracic spine. No other significant degenerative changes identified. No canal or neural foraminal stenosis. No impingement. IMPRESSION: MRI CERVICAL SPINE IMPRESSION 1. Multifactorial degenerative changes at C3-4 with resultant severe spinal stenosis with cord impingement. Subtle T2 signal abnormality within the cord at this level suspicious for edema and/or myelomalacia. 2. Prior ACDF at C3-C6, with residual mild to moderate diffuse spinal stenosis elsewhere at these levels. 3. Multifactorial degenerative changes with resultant severe right C3 foraminal stenosis, with severe bilateral C4 through C6 foraminal narrowing, and moderate right C7 foraminal stenosis. MRI THORACIC SPINE IMPRESSION No acute abnormality within the thoracic spine. No significant stenosis or evidence for cord compression. Critical Value/emergent results were called by telephone at the time of interpretation on 12/27/2018 at 9:25 pm to Dr. Noemi Chapel , who verbally acknowledged these results. Electronically Signed   By: Jeannine Boga M.D.   On: 12/27/2018 21:29   Mr Thoracic Spine W Wo Contrast  Result Date: 12/27/2018 CLINICAL DATA:  Initial evaluation for acute and progressive myelopathy. EXAM: MRI CERVICAL AND THORACIC SPINE WITHOUT AND WITH CONTRAST TECHNIQUE: Multiplanar and multiecho pulse sequences of the cervical spine, to include the craniocervical junction and cervicothoracic junction, and the thoracic spine, were obtained without and with intravenous contrast. CONTRAST:  10 cc of Gadavist. COMPARISON:  None available. FINDINGS: MRI CERVICAL SPINE FINDINGS Alignment: Vertebral bodies normally  aligned with preservation of the normal cervical lordosis. No listhesis. Vertebrae: Susceptibility artifact from prior ACDF at C3 through C6. Vertebral body heights maintained without evidence for acute or chronic fracture. Bone marrow signal intensity within normal limits. No discrete or worrisome osseous lesions. Reactive marrow edema with mild enhancement about the right greater than left C4-5 facets, most likely due to facet arthritis. Cord: Evaluation of the cervical spinal cord somewhat limited by body habitus and motion artifact vague T2 signal abnormality within the cervical spinal cord at the level of C3-4, suspicious for edema and/or myelomalacia (series 4,  image 10). Possible postcontrast enhancement, although difficult to be certain given adjacent susceptibility artifact from fusion hardware. Signal intensity within the cervical spinal cord otherwise within normal limits. Posterior Fossa, vertebral arteries, paraspinal tissues: Visualized brain and posterior fossa within normal limits. Craniocervical junction normal. Paraspinous and prevertebral soft tissues normal. Normal intravascular flow voids seen within the vertebral arteries bilaterally. Disc levels: C2-C3: Mild disc bulge. Right greater than left uncovertebral hypertrophy. Prominent right-sided facet degeneration. Resultant mild spinal stenosis without cord deformity. Severe right with mild left C3 foraminal narrowing. C3-C4: Prior fusion. Broad-based posterior osseous ridging flattens and effaces the ventral thecal sac. Superimposed facet and ligamentum flavum hypertrophy. Resultant severe spinal stenosis with compression of the cervical spinal cord. Thecal sac measures approximately 5 mm in AP diameter at its most narrow point. Severe bilateral C4 foraminal stenosis. C4-C5: Prior fusion. Small amount of enhancing T2 hyperintense signal intensity at the right ventral epidural space suspected to reflect postoperative granulation tissue (series 4,  image 12). Facet and ligament flavum hypertrophy. Residual mild to moderate spinal stenosis without cord compression. Residual uncovertebral spurring with resultant severe bilateral C5 foraminal stenosis. C5-C6: Prior fusion. Residual posterior osseous ridging flattens and effaces the ventral thecal sac. Residual mild moderate spinal stenosis without cord compression. Severe bilateral C6 foraminal stenosis. C6-C7: Right eccentric disc osteophyte with intervertebral disc space narrowing, with right greater than left uncovertebral hypertrophy. Flattening of the ventral thecal sac without significant spinal stenosis. Moderate right with mild left C7 foraminal narrowing. C7-T1: Mild disc bulge with facet hypertrophy. No significant spinal stenosis. Foramina are grossly patent. MRI THORACIC SPINE FINDINGS Alignment: Mild exaggeration of the normal thoracic kyphosis. No listhesis or malalignment. Vertebrae: Vertebral body heights maintained without evidence for acute or chronic fracture. Bone marrow signal intensity within normal limits. Multiple scattered benign hemangiomas noted throughout the thoracic spine, most prominent of which measures 2.3 cm at the L1 vertebral body. No worrisome osseous lesions. No abnormal marrow edema or enhancement. Cord: Signal intensity within the thoracic spinal cord is normal. Normal cord caliber morphology. Conus medullaris terminates at approximately the L1 level. Paraspinal and other soft tissues: Paraspinous soft tissues demonstrate no acute finding. Partially visualized lungs are grossly clear. Visualized visceral structures unremarkable. Disc levels: Minimal disc bulge noted at T7-8 without significant stenosis. Mild scattered posterior element hypertrophy noted within the lower thoracic spine. No other significant degenerative changes identified. No canal or neural foraminal stenosis. No impingement. IMPRESSION: MRI CERVICAL SPINE IMPRESSION 1. Multifactorial degenerative changes  at C3-4 with resultant severe spinal stenosis with cord impingement. Subtle T2 signal abnormality within the cord at this level suspicious for edema and/or myelomalacia. 2. Prior ACDF at C3-C6, with residual mild to moderate diffuse spinal stenosis elsewhere at these levels. 3. Multifactorial degenerative changes with resultant severe right C3 foraminal stenosis, with severe bilateral C4 through C6 foraminal narrowing, and moderate right C7 foraminal stenosis. MRI THORACIC SPINE IMPRESSION No acute abnormality within the thoracic spine. No significant stenosis or evidence for cord compression. Critical Value/emergent results were called by telephone at the time of interpretation on 12/27/2018 at 9:25 pm to Dr. Noemi Chapel , who verbally acknowledged these results. Electronically Signed   By: Jeannine Boga M.D.   On: 12/27/2018 21:29    Procedures Procedures (including critical care time)  Medications Ordered in ED Medications  sodium chloride flush (NS) 0.9 % injection 3 mL (3 mLs Intravenous Given 12/27/18 1703)  gadobutrol (GADAVIST) 1 MMOL/ML injection 10 mL (10 mLs Intravenous Contrast Given 12/27/18 2029)  Initial Impression / Assessment and Plan / ED Course  I have reviewed the triage vital signs and the nursing notes.  Pertinent labs & imaging results that were available during my care of the patient were reviewed by me and considered in my medical decision making (see chart for details).  Clinical Course as of Dec 27 2226  Mon Dec 27, 2018  1634 Does the care with Dr. Leonel Ramsay of the neurology service, he agrees with the recommended since cervical spine and thoracic spine MRIs, will contact neurology after they are resulted however this may be more of a surgical issue.   [BM]    Clinical Course User Index [BM] Noemi Chapel, MD       The patient's labs show hyperglycemia but no other acute findings.  His neurologic exam is abnormal and will need formal evaluation by  neurology.  I suspect imaging of his cervical and thoracic spines will be needed given his abnormal arm and leg function.  The patient and family are agreeable to the plan.  I discussed the care with Dr. Saintclair Halsted at 10:00 PM after the MRI revealed severe stenosis of the cervical spine at C3-4.  Requesting plain films, CT scan and medical records from [facility] regarding the surgical procedure.  I have made these request.    Final Clinical Impressions(s) / ED Diagnoses   Final diagnoses:  Cervical stenosis of spinal canal      Noemi Chapel, MD 12/27/18 2228

## 2018-12-28 ENCOUNTER — Emergency Department (HOSPITAL_COMMUNITY): Payer: Medicare HMO

## 2018-12-28 DIAGNOSIS — M4802 Spinal stenosis, cervical region: Secondary | ICD-10-CM | POA: Diagnosis present

## 2018-12-28 DIAGNOSIS — Z7982 Long term (current) use of aspirin: Secondary | ICD-10-CM | POA: Diagnosis not present

## 2018-12-28 DIAGNOSIS — Z79899 Other long term (current) drug therapy: Secondary | ICD-10-CM | POA: Diagnosis not present

## 2018-12-28 DIAGNOSIS — G8250 Quadriplegia, unspecified: Secondary | ICD-10-CM | POA: Diagnosis present

## 2018-12-28 DIAGNOSIS — I10 Essential (primary) hypertension: Secondary | ICD-10-CM | POA: Diagnosis present

## 2018-12-28 DIAGNOSIS — Z7984 Long term (current) use of oral hypoglycemic drugs: Secondary | ICD-10-CM | POA: Diagnosis not present

## 2018-12-28 DIAGNOSIS — S14123A Central cord syndrome at C3 level of cervical spinal cord, initial encounter: Secondary | ICD-10-CM | POA: Diagnosis present

## 2018-12-28 DIAGNOSIS — M48062 Spinal stenosis, lumbar region with neurogenic claudication: Secondary | ICD-10-CM | POA: Diagnosis present

## 2018-12-28 DIAGNOSIS — G959 Disease of spinal cord, unspecified: Secondary | ICD-10-CM | POA: Diagnosis present

## 2018-12-28 DIAGNOSIS — Z981 Arthrodesis status: Secondary | ICD-10-CM | POA: Diagnosis not present

## 2018-12-28 DIAGNOSIS — W1830XA Fall on same level, unspecified, initial encounter: Secondary | ICD-10-CM | POA: Diagnosis present

## 2018-12-28 DIAGNOSIS — E1165 Type 2 diabetes mellitus with hyperglycemia: Secondary | ICD-10-CM | POA: Diagnosis present

## 2018-12-28 DIAGNOSIS — G2581 Restless legs syndrome: Secondary | ICD-10-CM | POA: Diagnosis present

## 2018-12-28 DIAGNOSIS — Z791 Long term (current) use of non-steroidal anti-inflammatories (NSAID): Secondary | ICD-10-CM | POA: Diagnosis not present

## 2018-12-28 DIAGNOSIS — I480 Paroxysmal atrial fibrillation: Secondary | ICD-10-CM | POA: Diagnosis present

## 2018-12-28 LAB — GLUCOSE, CAPILLARY
Glucose-Capillary: 165 mg/dL — ABNORMAL HIGH (ref 70–99)
Glucose-Capillary: 178 mg/dL — ABNORMAL HIGH (ref 70–99)
Glucose-Capillary: 251 mg/dL — ABNORMAL HIGH (ref 70–99)
Glucose-Capillary: 221 mg/dL — ABNORMAL HIGH (ref 70–99)
Glucose-Capillary: 148 mg/dL — ABNORMAL HIGH (ref 70–99)

## 2018-12-28 LAB — URINE CULTURE

## 2018-12-28 MED ORDER — SODIUM CHLORIDE 0.9 % IV SOLN
250.0000 mL | INTRAVENOUS | Status: DC
  Administered 2018-12-28: 250 mL via INTRAVENOUS

## 2018-12-28 MED ORDER — ACETAMINOPHEN 500 MG PO TABS: 1000.0000 mg | ORAL_TABLET | Freq: Once | ORAL | Status: DC | PRN

## 2018-12-28 MED ORDER — THROMBIN 5000 UNITS EX SOLR
CUTANEOUS | Status: AC
  Filled 2018-12-28: qty 10000

## 2018-12-28 MED ORDER — GLIPIZIDE ER 10 MG PO TB24
10.0000 mg | ORAL_TABLET | Freq: Two times a day (BID) | ORAL | Status: DC
  Administered 2018-12-28 – 2018-12-31 (×7): 10 mg via ORAL
  Filled 2018-12-28 (×8): qty 1

## 2018-12-28 MED ORDER — PHENOL 1.4 % MT LIQD: 1.0000 | OROMUCOSAL | Status: DC | PRN

## 2018-12-28 MED ORDER — OXYCODONE HCL 5 MG PO TABS
10.0000 mg | ORAL_TABLET | ORAL | Status: DC | PRN
  Administered 2018-12-28 – 2018-12-30 (×3): 10 mg via ORAL
  Filled 2018-12-28 (×3): qty 2

## 2018-12-28 MED ORDER — ASPIRIN EC 81 MG PO TBEC
81.0000 mg | DELAYED_RELEASE_TABLET | Freq: Every day | ORAL | Status: DC
  Administered 2018-12-28 – 2018-12-31 (×4): 81 mg via ORAL
  Filled 2018-12-28 (×4): qty 1

## 2018-12-28 MED ORDER — GABAPENTIN 100 MG PO CAPS
100.0000 mg | ORAL_CAPSULE | Freq: Three times a day (TID) | ORAL | Status: DC
  Administered 2018-12-28 – 2018-12-31 (×10): 100 mg via ORAL
  Filled 2018-12-28 (×10): qty 1

## 2018-12-28 MED ORDER — METFORMIN HCL ER 500 MG PO TB24
1000.0000 mg | ORAL_TABLET | Freq: Two times a day (BID) | ORAL | Status: DC
  Administered 2018-12-28 – 2018-12-31 (×7): 1000 mg via ORAL
  Filled 2018-12-28 (×7): qty 2

## 2018-12-28 MED ORDER — SODIUM CHLORIDE 0.9% FLUSH: 3.0000 mL | INTRAVENOUS | Status: DC | PRN

## 2018-12-28 MED ORDER — ADULT MULTIVITAMIN W/MINERALS CH
1.0000 | ORAL_TABLET | Freq: Every day | ORAL | Status: DC
  Administered 2018-12-28 – 2018-12-31 (×4): 1 via ORAL
  Filled 2018-12-28 (×4): qty 1

## 2018-12-28 MED ORDER — ALLOPURINOL 100 MG PO TABS
100.0000 mg | ORAL_TABLET | Freq: Every day | ORAL | Status: DC
  Administered 2018-12-28 – 2018-12-31 (×4): 100 mg via ORAL
  Filled 2018-12-28 (×4): qty 1

## 2018-12-28 MED ORDER — VITAMIN B-12 1000 MCG PO TABS
1000.0000 ug | ORAL_TABLET | Freq: Every day | ORAL | Status: DC
  Administered 2018-12-28 – 2018-12-31 (×4): 1000 ug via ORAL
  Filled 2018-12-28 (×4): qty 1

## 2018-12-28 MED ORDER — CALCIUM CARBONATE-VITAMIN D 500-200 MG-UNIT PO TABS
1.0000 | ORAL_TABLET | Freq: Every day | ORAL | Status: DC
  Administered 2018-12-28 – 2018-12-31 (×4): 1 via ORAL
  Filled 2018-12-28 (×4): qty 1

## 2018-12-28 MED ORDER — OXYCODONE HCL 5 MG/5ML PO SOLN: 5.0000 mg | Freq: Once | ORAL | Status: DC | PRN

## 2018-12-28 MED ORDER — SODIUM CHLORIDE 0.9% FLUSH
3.0000 mL | Freq: Two times a day (BID) | INTRAVENOUS | Status: DC
  Administered 2018-12-28 – 2018-12-31 (×6): 3 mL via INTRAVENOUS

## 2018-12-28 MED ORDER — SODIUM CHLORIDE 0.9 % IV SOLN
INTRAVENOUS | Status: DC | PRN
  Administered 2018-12-28: 50 ug/min via INTRAVENOUS

## 2018-12-28 MED ORDER — ALUM & MAG HYDROXIDE-SIMETH 200-200-20 MG/5ML PO SUSP: 30.0000 mL | Freq: Four times a day (QID) | ORAL | Status: DC | PRN

## 2018-12-28 MED ORDER — HYDROCHLOROTHIAZIDE 25 MG PO TABS
25.0000 mg | ORAL_TABLET | Freq: Every day | ORAL | Status: DC
  Administered 2018-12-28 – 2018-12-31 (×4): 25 mg via ORAL
  Filled 2018-12-28 (×4): qty 1

## 2018-12-28 MED ORDER — SUGAMMADEX SODIUM 200 MG/2ML IV SOLN
INTRAVENOUS | Status: DC | PRN
  Administered 2018-12-28: 200 mg via INTRAVENOUS

## 2018-12-28 MED ORDER — ONDANSETRON HCL 4 MG/2ML IJ SOLN: 4.0000 mg | Freq: Four times a day (QID) | INTRAMUSCULAR | Status: DC | PRN

## 2018-12-28 MED ORDER — LACTATED RINGERS IV SOLN
INTRAVENOUS | Status: DC | PRN
  Administered 2018-12-27 – 2018-12-28 (×2): via INTRAVENOUS

## 2018-12-28 MED ORDER — SODIUM CHLORIDE 0.9 % IV SOLN
INTRAVENOUS | Status: DC | PRN
  Administered 2018-12-28: 500 mL

## 2018-12-28 MED ORDER — FENTANYL CITRATE (PF) 100 MCG/2ML IJ SOLN: 25.0000 ug | INTRAMUSCULAR | Status: DC | PRN

## 2018-12-28 MED ORDER — PANTOPRAZOLE SODIUM 40 MG PO TBEC
40.0000 mg | DELAYED_RELEASE_TABLET | Freq: Every day | ORAL | Status: DC
  Administered 2018-12-28 – 2018-12-30 (×3): 40 mg via ORAL
  Filled 2018-12-28 (×3): qty 1

## 2018-12-28 MED ORDER — HYDROMORPHONE HCL 1 MG/ML IJ SOLN: 0.2500 mg | INTRAMUSCULAR | Status: DC | PRN

## 2018-12-28 MED ORDER — THROMBIN 5000 UNITS EX SOLR
OROMUCOSAL | Status: DC | PRN
  Administered 2018-12-28: 5 mL via TOPICAL

## 2018-12-28 MED ORDER — ACETAMINOPHEN 650 MG RE SUPP: 650.0000 mg | RECTAL | Status: DC | PRN

## 2018-12-28 MED ORDER — THROMBIN 5000 UNITS EX SOLR
CUTANEOUS | Status: DC | PRN
  Administered 2018-12-28 (×2): 5000 [IU] via TOPICAL

## 2018-12-28 MED ORDER — HYDROMORPHONE HCL 1 MG/ML IJ SOLN: 0.5000 mg | INTRAMUSCULAR | Status: DC | PRN

## 2018-12-28 MED ORDER — ACETAMINOPHEN 160 MG/5ML PO SOLN: 1000.0000 mg | Freq: Once | ORAL | Status: DC | PRN

## 2018-12-28 MED ORDER — BUPIVACAINE HCL (PF) 0.25 % IJ SOLN
INTRAMUSCULAR | Status: DC | PRN
  Administered 2018-12-28: 10 mL

## 2018-12-28 MED ORDER — ACETAMINOPHEN 325 MG PO TABS: 650.0000 mg | ORAL_TABLET | ORAL | Status: DC | PRN

## 2018-12-28 MED ORDER — PANTOPRAZOLE SODIUM 40 MG IV SOLR: 40.0000 mg | Freq: Every day | INTRAVENOUS | Status: DC

## 2018-12-28 MED ORDER — SODIUM CHLORIDE 0.9 % IV SOLN
INTRAVENOUS | Status: DC | PRN
  Administered 2018-12-28: 01:00:00 via INTRAVENOUS

## 2018-12-28 MED ORDER — MENTHOL 3 MG MT LOZG: 1.0000 | LOZENGE | OROMUCOSAL | Status: DC | PRN

## 2018-12-28 MED ORDER — ACETAMINOPHEN 10 MG/ML IV SOLN: 1000.0000 mg | Freq: Once | INTRAVENOUS | Status: DC | PRN

## 2018-12-28 MED ORDER — LIDOCAINE-EPINEPHRINE 1 %-1:100000 IJ SOLN
INTRAMUSCULAR | Status: DC | PRN
  Administered 2018-12-28: 8 mL via INTRADERMAL

## 2018-12-28 MED ORDER — HEMOSTATIC AGENTS (NO CHARGE) OPTIME
TOPICAL | Status: DC | PRN
  Administered 2018-12-28: 1 via TOPICAL

## 2018-12-28 MED ORDER — ATORVASTATIN CALCIUM 10 MG PO TABS
20.0000 mg | ORAL_TABLET | Freq: Every day | ORAL | Status: DC
  Administered 2018-12-28 – 2018-12-30 (×3): 20 mg via ORAL
  Filled 2018-12-28 (×3): qty 2

## 2018-12-28 MED ORDER — ONDANSETRON HCL 4 MG PO TABS: 4.0000 mg | ORAL_TABLET | Freq: Four times a day (QID) | ORAL | Status: DC | PRN

## 2018-12-28 MED ORDER — CYCLOBENZAPRINE HCL 10 MG PO TABS
10.0000 mg | ORAL_TABLET | Freq: Three times a day (TID) | ORAL | Status: DC | PRN
  Administered 2018-12-28 – 2018-12-30 (×3): 10 mg via ORAL
  Filled 2018-12-28 (×3): qty 1

## 2018-12-28 MED ORDER — ONDANSETRON HCL 4 MG/2ML IJ SOLN
INTRAMUSCULAR | Status: AC
  Filled 2018-12-28: qty 2

## 2018-12-28 MED ORDER — CEFAZOLIN SODIUM-DEXTROSE 2-4 GM/100ML-% IV SOLN
2.0000 g | Freq: Three times a day (TID) | INTRAVENOUS | Status: AC
  Administered 2018-12-28 (×2): 2 g via INTRAVENOUS
  Filled 2018-12-28 (×2): qty 100

## 2018-12-28 MED ORDER — OXYCODONE HCL 5 MG PO TABS: 5.0000 mg | ORAL_TABLET | Freq: Once | ORAL | Status: DC | PRN

## 2018-12-28 MED ORDER — BACITRACIN ZINC 500 UNIT/GM EX OINT
TOPICAL_OINTMENT | CUTANEOUS | Status: DC | PRN
  Administered 2018-12-28: 1 via TOPICAL

## 2018-12-28 MED ORDER — ONDANSETRON HCL 4 MG/2ML IJ SOLN
INTRAMUSCULAR | Status: DC | PRN
  Administered 2018-12-28: 4 mg via INTRAVENOUS

## 2018-12-28 MED ORDER — CARVEDILOL 12.5 MG PO TABS
12.5000 mg | ORAL_TABLET | Freq: Two times a day (BID) | ORAL | Status: DC
  Administered 2018-12-28 – 2018-12-31 (×6): 12.5 mg via ORAL
  Filled 2018-12-28 (×6): qty 1

## 2018-12-28 MED ORDER — LIRAGLUTIDE 18 MG/3ML ~~LOC~~ SOPN: 1.2000 mg | PEN_INJECTOR | SUBCUTANEOUS | Status: DC

## 2018-12-28 MED ORDER — VITAMIN D 25 MCG (1000 UNIT) PO TABS
2000.0000 [IU] | ORAL_TABLET | Freq: Every day | ORAL | Status: DC
  Administered 2018-12-28 – 2018-12-31 (×4): 2000 [IU] via ORAL
  Filled 2018-12-28 (×4): qty 2

## 2018-12-28 NOTE — Social Work (Signed)
CSW acknowledging consult for SNF placement. Will follow for therapy recommendations.   Alexus Galka, MSW, LCSWA [facility] [phone]   

## 2018-12-28 NOTE — Care Management Note (Signed)
Case Management Note  Patient Details  Name: [name] MRN: [id number] Date of Birth: 06/27/1941  Subjective/Objective:   78 yo admitted for C-6 posterior ACDF after fall in january with progressive quadraparesis.  PTA, pt needs assistance with ADLS; he was residing at [facility].                  Action/Plan: PT/OT recommending SNF; CSW following to facilitate dc to SNF upon medical stability.    Expected Discharge Date:                  Expected Discharge Plan:  Skilled Nursing Facility  In-House Referral:  Clinical Social Work  Discharge planning Services  CM Consult  Post Acute Care Choice:    Choice offered to:     DME Arranged:    DME Agency:     HH Arranged:    [place] Agency:     Status of Service:  In process, will continue to follow  If discussed at Long Length of Stay Meetings, dates discussed:    Additional Comments:  Reinaldo Raddle, RN, BSN  Trauma/Neuro ICU Case Manager [phone]

## 2018-12-28 NOTE — Evaluation (Signed)
Occupational Therapy Evaluation Patient Details Name: Ralph Griffin MRN: [id number] DOB: 1941/06/05 Today's Date: 12/28/2018    History of Present Illness 77 yo admitted for C-6 posterior ACDF after fall in january with progressive quadraparesis. PMhx: C3-6 ACDF, DM, HTN   Clinical Impression   Patient supine in bed, pleasant and cooperative.  PTA needing increased assist with ADLs and mobility since fall in late January. Today, requires max to total assist for eating, grooming and UB ADLs and total assist for LB ADLs.  Limited eval, recently returned back to supine with PT and reports requiring +2 max assist for bed mobility.  Patient limited by B UE shoulder ROM/overall strength and coordination, decreased activity tolerance, body habitus, and impaired balance.  Patient will benefit from continued OT services while admitted and after dc at SNF level in order to optimize independence and safety with ADLs/mobility. Will follow acutely.   Follow Up Recommendations  SNF;Supervision/Assistance - 24 hour    Equipment Recommendations  Other (comment)(TBD at next venue of care)    Recommendations for Other Services       Precautions / Restrictions Precautions Precautions: Cervical;Fall Precaution Booklet Issued: No Required Braces or Orthoses: Cervical Brace Cervical Brace: Hard collar;At all times Restrictions Weight Bearing Restrictions: No      Mobility Bed Mobility Overal bed mobility: Needs Assistance Bed Mobility: Rolling;Sidelying to Sit;Sit to Sidelying Rolling: Max assist;+2 for physical assistance Sidelying to sit: Max assist;+2 for physical assistance     Sit to sidelying: Max assist;+2 for physical assistance General bed mobility comments: deferred due to PT just returned to supine, requires +2 assist (total assist to scoot up in bed using trendelenburg position)   Transfers                 General transfer comment: deferred    Balance Overall balance  assessment: Needs assistance   Sitting balance-Leahy Scale: Zero Sitting balance - Comments: max assist with posterior lean in sitting and able to correct to midline briefly with max cues but unable to maintain with any movement of extremities. EOB grossly 5 min Postural control: Posterior lean                                 ADL either performed or assessed with clinical judgement   ADL Overall ADL's : Needs assistance/impaired Eating/Feeding: Maximal assistance;Bed level Eating/Feeding Details (indicate cue type and reason): max assist to hold utensils and bring to mouth  Grooming: Maximal assistance;Wash/dry hands;Wash/dry face;Oral care;Bed level Grooming Details (indicate cue type and reason): decreased coordinatoin and ROM limiting functional independence  Upper Body Bathing: Maximal assistance;Bed level   Lower Body Bathing: Total assistance;+2 for physical assistance;Bed level   Upper Body Dressing : Total assistance;Bed level   Lower Body Dressing: Total assistance;+2 for physical assistance;Bed level     Toilet Transfer Details (indicate cue type and reason): deferred dueto safety           General ADL Comments: limited to bed level eval, per PT requires max-total +2 for bed mobility and sit EOB      Vision         Perception     Praxis      Pertinent Vitals/Pain Pain Assessment: No/denies pain     Hand Dominance Left   Extremity/Trunk Assessment Upper Extremity Assessment Upper Extremity Assessment: RUE deficits/detail;LUE deficits/detail RUE Deficits / Details: grossly 15 degrees of shoulder flexion, functional AROM elbow distal  but limited strength and coordination (elbow 3-/5, hand 2-/5)  RUE Sensation: decreased light touch(reports tingly ) RUE Coordination: decreased fine motor;decreased gross motor LUE Deficits / Details: grossly 10 degrees of shoulder flexion, functional AROM elbow distal but limited strength and coordination (elbow  3-/5, hand 2+/5)  LUE Sensation: decreased light touch(reports tingly ) LUE Coordination: decreased fine motor;decreased gross motor   Lower Extremity Assessment Lower Extremity Assessment: Defer to PT evaluation RLE Deficits / Details: 2/5 active dorsiflexion, 2-/5 knee extension, 2-/5 very limited knee flexion with increased tone with attempts at flexion LLE Deficits / Details: pt with active dorsiflexion with resting inversion, able to perform knee flexion 2/5 but unable to maintain with increased tone into extension on RLE with any movement of LLE, grossly 40 degrees hip flexion as pt with increased tone with hip flexion and functional mobility   Cervical / Trunk Assessment Cervical / Trunk Assessment: Other exceptions Cervical / Trunk Exceptions: post surgical    Communication Communication Communication: HOH   Cognition Arousal/Alertness: Awake/alert Behavior During Therapy: WFL for tasks assessed/performed Overall Cognitive Status: Impaired/Different from baseline Area of Impairment: Awareness;Safety/judgement                         Safety/Judgement: Decreased awareness of safety;Decreased awareness of deficits Awareness: Emergent   General Comments: patient with improving awareness of deficits and assist needed    General Comments  educated on exercises for B UEs (see below), requires compensatory movements for wrist ROM, PROM of UEs in all planes provided (shoulder flexion to 90 only)    Exercises General Exercises - Upper Extremity Elbow Flexion: AROM;5 reps;Both;Supine Elbow Extension: AROM;Both;5 reps;Supine Wrist Flexion: AROM;5 reps;Both;Supine Wrist Extension: AROM;Both;5 reps;Supine Digit Composite Flexion: AROM;Both;5 reps;Supine Composite Extension: AROM;Both;5 reps;Supine   Shoulder Instructions      Home Living Family/patient expects to be discharged to:: Skilled nursing facility Living Arrangements: Spouse/significant other Available Help at  Discharge: Family Type of Home: House Home Access: Stairs to enter [employer] of Steps: 1   Home Layout: One level     Bathroom Shower/Tub: Teacher, early years/pre: Handicapped height     Home Equipment: Environmental consultant - 2 wheels;Bedside commode   Additional Comments: pt reports 3 falls in the last year      Prior Functioning/Environment Level of Independence: Needs assistance  Gait / Transfers Assistance Needed: walked with RW after NOV surgery. Fall late Jan with weakness and inability to stand since Feb 2nd. Has been max/total assist since then with hoyer lift OOB at SNF ADL's / Homemaking Assistance Needed: wife does the homemaking and assists with bathing and dressing since ACDF, since Jan fall max assist progressing to need for feeding for grossly a week            OT Problem List: Decreased strength;Decreased range of motion;Decreased activity tolerance;Impaired balance (sitting and/or standing);Decreased coordination;Decreased safety awareness;Decreased knowledge of use of DME or AE;Decreased knowledge of precautions;Impaired sensation;Impaired UE functional use      OT Treatment/Interventions: Self-care/ADL training;Therapeutic exercise;Neuromuscular education;Energy conservation;DME and/or AE instruction;Therapeutic activities;Patient/family education;Balance training    OT Goals(Current goals can be found in the care plan section) Acute Rehab OT Goals Patient Stated Goal: to get my independence back OT Goal Formulation: With patient Time For Goal Achievement: 01/11/19 Potential to Achieve Goals: Good  OT Frequency: Min 2X/week   Barriers to D/C:            Co-evaluation  AM-PAC OT "6 Clicks" Daily Activity     Outcome Measure Help from another person eating meals?: Total Help from another person taking care of personal grooming?: A Lot Help from another person toileting, which includes using toliet, bedpan, or urinal?:  Total Help from another person bathing (including washing, rinsing, drying)?: Total Help from another person to put on and taking off regular upper body clothing?: Total Help from another person to put on and taking off regular lower body clothing?: Total 6 Click Score: 7   End of Session Equipment Utilized During Treatment: Cervical collar Nurse Communication: Mobility status  Activity Tolerance: Patient tolerated treatment well Patient left: in bed;with call bell/phone within reach;with bed alarm set  OT Visit Diagnosis: Other abnormalities of gait and mobility (R26.89);Muscle weakness (generalized) (M62.81);Other symptoms and signs involving the nervous system (R29.898)                Time: 2924-4628 OT Time Calculation (min): 23 min Charges:  OT General Charges $OT Visit: 1 Visit OT Evaluation $OT Eval Moderate Complexity: 1 Mod OT Treatments $Self Care/Home Management : 8-22 mins  Delight Stare, OT Acute Rehabilitation Services Pager [phone] Office [phone]   Delight Stare 12/28/2018, 9:42 AM

## 2018-12-28 NOTE — Transfer of Care (Signed)
Immediate Anesthesia Transfer of Care Note  Patient: Ralph Griffin  Procedure(s) Performed: Decompressive Cervical Laminectomy and Posterior Cervical Fusion Cervical three-four, four-five, five-six (N/A Neck)  Patient Location: PACU  Anesthesia Type:General  Level of Consciousness: awake  Airway & Oxygen Therapy: Patient Spontanous Breathing  Post-op Assessment: Report given to RN and Post -op Vital signs reviewed and stable  Post vital signs: Reviewed and stable  Last Vitals:  Vitals Value Taken Time  BP 118/71 12/28/2018  2:20 AM  Temp    Pulse 116 12/28/2018  2:21 AM  Resp 15 12/28/2018  2:21 AM  SpO2 96 % 12/28/2018  2:21 AM  Vitals shown include unvalidated device data.  Last Pain:  Vitals:   12/27/18 1853  TempSrc:   PainSc: 0-No pain         Complications: No apparent anesthesia complications

## 2018-12-28 NOTE — Progress Notes (Signed)
Subjective: Patient reports improved sensation and movemnt right hand  Objective: Vital signs in last 24 hours: Temp:  [97.2 F (36.2 C)-97.8 F (36.6 C)] 97.8 F (36.6 C) (02/18 0300) Pulse Rate:  [36-120] 104 (02/18 0300) Resp:  [14-27] 16 (02/18 0300) BP: (108-142)/(60-98) 131/71 (02/18 0300) SpO2:  [94 %-100 %] 95 % (02/18 0300)  Intake/Output from previous day: 02/17 0701 - 02/18 0700 In: 1300.2 [I.V.:1300.2] Out: 565 [Urine:425; Drains:40; Blood:100] Intake/Output this shift: No intake/output data recorded.  increased grip to 2-3/5 left hand, otheriwse stable nad wound clean dry and intact  Lab Results: Recent Labs    12/27/18 1350  WBC 10.4  HGB 12.2*  HCT 38.8*  PLT 323   BMET Recent Labs    12/27/18 1350  NA 136  K 3.5  CL 104  CO2 19*  GLUCOSE 273*  BUN 21  CREATININE 1.00  CALCIUM 8.8*    Studies/Results: Dg Cervical Spine 2-3 Views  Result Date: 12/28/2018 CLINICAL DATA:  Cervical laminectomy and fusion. EXAM: DG C-ARM 61-120 MIN; CERVICAL SPINE - 2-3 VIEW COMPARISON:  CT 12/27/2018. FINDINGS: Prior anterior cervical spine anterior interbody fusion as previously described. Lower cervical vertebra and hardware difficult visualized. C3-C4, C4-C5, C5-C6 posterior fusion. Anatomic alignment. 8 seconds fluoroscopy time. IMPRESSION: Postsurgical changes cervical spine as above. Electronically Signed   By: Marcello Moores  Register   On: 12/28/2018 05:54   Ct Cervical Spine Wo Contrast  Result Date: 12/27/2018 CLINICAL DATA:  78 year old male with progressive extremity weakness after a fall in January. Prior ACDF in November. Planned posterior decompression tonight. EXAM: CT CERVICAL SPINE WITHOUT CONTRAST TECHNIQUE: Multidetector CT imaging of the cervical spine was performed without intravenous contrast. Multiplanar CT image reconstructions were also generated. COMPARISON:  Cervical spine MRI earlier today. Cervical spine CT 12/11/2018, and earlier. FINDINGS:  Alignment: Stable cervical lordosis from earlier this month. Cervicothoracic junction alignment is within normal limits. Bilateral posterior element alignment is within normal limits. Skull base and vertebrae: Visualized skull base is intact. No atlanto-occipital dissociation. No cervical spine fracture identified. There is lucency along the C6 cortical screws (sagittal images 31 and 33). Each level is further described below. Soft tissues and spinal canal: Negative noncontrast neck soft tissues. Disc levels: C2-C3: Facet hypertrophy is moderate to severe and greater on the right. Trace right vacuum facet. Posterior ligamentous hypertrophy. Mild spinal stenosis as seen on the MRI earlier tonight. There is bilateral foraminal stenosis. C3-C4: Prior ACDF. The hardware appears intact. Posterior to the interbody implant there seems to be amorphous hyperdense material in the ventral canal (sagittal image 33) with several superimposed tiny foci of gas. This would correspond to the suggestion of some ventral filling defect on some of this sagittal MRI sequences today, and compound spinal stenosis in addition to the posterior ligamentous hypertrophy seen by MRI. C4-C5: Prior ACDF. Partially calcified ligament flavum hypertrophy at this level, but no other adverse features. C5-C6: Prior ACDF. C6 cortical screw loosening (sagittal image 33 and coronal image 22). Endplate spurring. Ligament flavum hypertrophy at this level was better demonstrated by MRI along with mild spinal stenosis. C6-C7: Mild endplate spurring. No spinal stenosis at this level by MRI. C7-T1:  Mostly anterior endplate spurring with vacuum phenomena. Upper chest: Visible upper thoracic levels appear intact. Stable lung apices. Other: Negative visualized noncontrast brain parenchyma. Calcified atherosclerosis at the skull base. Visualized paranasal sinuses and mastoids are stable and well pneumatized. IMPRESSION: 1. No cervical spine fracture identified,  although there is bilateral C6 cortical screw  loosening. 2. Mixed density, amorphous space-occupying material is suspected in the ventral canal at C3-C4 (sagittal image 33). This and might be related to the interbody implant, uncertain. Suspect this compounds the spinal stenosis at this level. 3. Mild spinal stenosis also at C2-C3 and C5-C6 as seen by MRI earlier today and in part due to the posterior ligamentous hypertrophy. 4. Study discussed by telephone with Dr. Saintclair Halsted on 12/27/2018 at 2324 hours. Electronically Signed   By: Genevie Ann M.D.   On: 12/27/2018 23:35   Mr Cervical Spine W Or Wo Contrast  Result Date: 12/27/2018 CLINICAL DATA:  Initial evaluation for acute and progressive myelopathy. EXAM: MRI CERVICAL AND THORACIC SPINE WITHOUT AND WITH CONTRAST TECHNIQUE: Multiplanar and multiecho pulse sequences of the cervical spine, to include the craniocervical junction and cervicothoracic junction, and the thoracic spine, were obtained without and with intravenous contrast. CONTRAST:  10 cc of Gadavist. COMPARISON:  None available. FINDINGS: MRI CERVICAL SPINE FINDINGS Alignment: Vertebral bodies normally aligned with preservation of the normal cervical lordosis. No listhesis. Vertebrae: Susceptibility artifact from prior ACDF at C3 through C6. Vertebral body heights maintained without evidence for acute or chronic fracture. Bone marrow signal intensity within normal limits. No discrete or worrisome osseous lesions. Reactive marrow edema with mild enhancement about the right greater than left C4-5 facets, most likely due to facet arthritis. Cord: Evaluation of the cervical spinal cord somewhat limited by body habitus and motion artifact vague T2 signal abnormality within the cervical spinal cord at the level of C3-4, suspicious for edema and/or myelomalacia (series 4, image 10). Possible postcontrast enhancement, although difficult to be certain given adjacent susceptibility artifact from fusion hardware. Signal  intensity within the cervical spinal cord otherwise within normal limits. Posterior Fossa, vertebral arteries, paraspinal tissues: Visualized brain and posterior fossa within normal limits. Craniocervical junction normal. Paraspinous and prevertebral soft tissues normal. Normal intravascular flow voids seen within the vertebral arteries bilaterally. Disc levels: C2-C3: Mild disc bulge. Right greater than left uncovertebral hypertrophy. Prominent right-sided facet degeneration. Resultant mild spinal stenosis without cord deformity. Severe right with mild left C3 foraminal narrowing. C3-C4: Prior fusion. Broad-based posterior osseous ridging flattens and effaces the ventral thecal sac. Superimposed facet and ligamentum flavum hypertrophy. Resultant severe spinal stenosis with compression of the cervical spinal cord. Thecal sac measures approximately 5 mm in AP diameter at its most narrow point. Severe bilateral C4 foraminal stenosis. C4-C5: Prior fusion. Small amount of enhancing T2 hyperintense signal intensity at the right ventral epidural space suspected to reflect postoperative granulation tissue (series 4, image 12). Facet and ligament flavum hypertrophy. Residual mild to moderate spinal stenosis without cord compression. Residual uncovertebral spurring with resultant severe bilateral C5 foraminal stenosis. C5-C6: Prior fusion. Residual posterior osseous ridging flattens and effaces the ventral thecal sac. Residual mild moderate spinal stenosis without cord compression. Severe bilateral C6 foraminal stenosis. C6-C7: Right eccentric disc osteophyte with intervertebral disc space narrowing, with right greater than left uncovertebral hypertrophy. Flattening of the ventral thecal sac without significant spinal stenosis. Moderate right with mild left C7 foraminal narrowing. C7-T1: Mild disc bulge with facet hypertrophy. No significant spinal stenosis. Foramina are grossly patent. MRI THORACIC SPINE FINDINGS Alignment:  Mild exaggeration of the normal thoracic kyphosis. No listhesis or malalignment. Vertebrae: Vertebral body heights maintained without evidence for acute or chronic fracture. Bone marrow signal intensity within normal limits. Multiple scattered benign hemangiomas noted throughout the thoracic spine, most prominent of which measures 2.3 cm at the L1 vertebral body. No worrisome osseous  lesions. No abnormal marrow edema or enhancement. Cord: Signal intensity within the thoracic spinal cord is normal. Normal cord caliber morphology. Conus medullaris terminates at approximately the L1 level. Paraspinal and other soft tissues: Paraspinous soft tissues demonstrate no acute finding. Partially visualized lungs are grossly clear. Visualized visceral structures unremarkable. Disc levels: Minimal disc bulge noted at T7-8 without significant stenosis. Mild scattered posterior element hypertrophy noted within the lower thoracic spine. No other significant degenerative changes identified. No canal or neural foraminal stenosis. No impingement. IMPRESSION: MRI CERVICAL SPINE IMPRESSION 1. Multifactorial degenerative changes at C3-4 with resultant severe spinal stenosis with cord impingement. Subtle T2 signal abnormality within the cord at this level suspicious for edema and/or myelomalacia. 2. Prior ACDF at C3-C6, with residual mild to moderate diffuse spinal stenosis elsewhere at these levels. 3. Multifactorial degenerative changes with resultant severe right C3 foraminal stenosis, with severe bilateral C4 through C6 foraminal narrowing, and moderate right C7 foraminal stenosis. MRI THORACIC SPINE IMPRESSION No acute abnormality within the thoracic spine. No significant stenosis or evidence for cord compression. Critical Value/emergent results were called by telephone at the time of interpretation on 12/27/2018 at 9:25 pm to Dr. Noemi Chapel , who verbally acknowledged these results. Electronically Signed   By: Jeannine Boga  M.D.   On: 12/27/2018 21:29   Mr Thoracic Spine W Wo Contrast  Result Date: 12/27/2018 CLINICAL DATA:  Initial evaluation for acute and progressive myelopathy. EXAM: MRI CERVICAL AND THORACIC SPINE WITHOUT AND WITH CONTRAST TECHNIQUE: Multiplanar and multiecho pulse sequences of the cervical spine, to include the craniocervical junction and cervicothoracic junction, and the thoracic spine, were obtained without and with intravenous contrast. CONTRAST:  10 cc of Gadavist. COMPARISON:  None available. FINDINGS: MRI CERVICAL SPINE FINDINGS Alignment: Vertebral bodies normally aligned with preservation of the normal cervical lordosis. No listhesis. Vertebrae: Susceptibility artifact from prior ACDF at C3 through C6. Vertebral body heights maintained without evidence for acute or chronic fracture. Bone marrow signal intensity within normal limits. No discrete or worrisome osseous lesions. Reactive marrow edema with mild enhancement about the right greater than left C4-5 facets, most likely due to facet arthritis. Cord: Evaluation of the cervical spinal cord somewhat limited by body habitus and motion artifact vague T2 signal abnormality within the cervical spinal cord at the level of C3-4, suspicious for edema and/or myelomalacia (series 4, image 10). Possible postcontrast enhancement, although difficult to be certain given adjacent susceptibility artifact from fusion hardware. Signal intensity within the cervical spinal cord otherwise within normal limits. Posterior Fossa, vertebral arteries, paraspinal tissues: Visualized brain and posterior fossa within normal limits. Craniocervical junction normal. Paraspinous and prevertebral soft tissues normal. Normal intravascular flow voids seen within the vertebral arteries bilaterally. Disc levels: C2-C3: Mild disc bulge. Right greater than left uncovertebral hypertrophy. Prominent right-sided facet degeneration. Resultant mild spinal stenosis without cord deformity.  Severe right with mild left C3 foraminal narrowing. C3-C4: Prior fusion. Broad-based posterior osseous ridging flattens and effaces the ventral thecal sac. Superimposed facet and ligamentum flavum hypertrophy. Resultant severe spinal stenosis with compression of the cervical spinal cord. Thecal sac measures approximately 5 mm in AP diameter at its most narrow point. Severe bilateral C4 foraminal stenosis. C4-C5: Prior fusion. Small amount of enhancing T2 hyperintense signal intensity at the right ventral epidural space suspected to reflect postoperative granulation tissue (series 4, image 12). Facet and ligament flavum hypertrophy. Residual mild to moderate spinal stenosis without cord compression. Residual uncovertebral spurring with resultant severe bilateral C5 foraminal stenosis. C5-C6: Prior  fusion. Residual posterior osseous ridging flattens and effaces the ventral thecal sac. Residual mild moderate spinal stenosis without cord compression. Severe bilateral C6 foraminal stenosis. C6-C7: Right eccentric disc osteophyte with intervertebral disc space narrowing, with right greater than left uncovertebral hypertrophy. Flattening of the ventral thecal sac without significant spinal stenosis. Moderate right with mild left C7 foraminal narrowing. C7-T1: Mild disc bulge with facet hypertrophy. No significant spinal stenosis. Foramina are grossly patent. MRI THORACIC SPINE FINDINGS Alignment: Mild exaggeration of the normal thoracic kyphosis. No listhesis or malalignment. Vertebrae: Vertebral body heights maintained without evidence for acute or chronic fracture. Bone marrow signal intensity within normal limits. Multiple scattered benign hemangiomas noted throughout the thoracic spine, most prominent of which measures 2.3 cm at the L1 vertebral body. No worrisome osseous lesions. No abnormal marrow edema or enhancement. Cord: Signal intensity within the thoracic spinal cord is normal. Normal cord caliber morphology.  Conus medullaris terminates at approximately the L1 level. Paraspinal and other soft tissues: Paraspinous soft tissues demonstrate no acute finding. Partially visualized lungs are grossly clear. Visualized visceral structures unremarkable. Disc levels: Minimal disc bulge noted at T7-8 without significant stenosis. Mild scattered posterior element hypertrophy noted within the lower thoracic spine. No other significant degenerative changes identified. No canal or neural foraminal stenosis. No impingement. IMPRESSION: MRI CERVICAL SPINE IMPRESSION 1. Multifactorial degenerative changes at C3-4 with resultant severe spinal stenosis with cord impingement. Subtle T2 signal abnormality within the cord at this level suspicious for edema and/or myelomalacia. 2. Prior ACDF at C3-C6, with residual mild to moderate diffuse spinal stenosis elsewhere at these levels. 3. Multifactorial degenerative changes with resultant severe right C3 foraminal stenosis, with severe bilateral C4 through C6 foraminal narrowing, and moderate right C7 foraminal stenosis. MRI THORACIC SPINE IMPRESSION No acute abnormality within the thoracic spine. No significant stenosis or evidence for cord compression. Critical Value/emergent results were called by telephone at the time of interpretation on 12/27/2018 at 9:25 pm to Dr. Noemi Chapel , who verbally acknowledged these results. Electronically Signed   By: Jeannine Boga M.D.   On: 12/27/2018 21:29   Dg C-arm 1-60 Min  Result Date: 12/28/2018 CLINICAL DATA:  Cervical laminectomy and fusion. EXAM: DG C-ARM 61-120 MIN; CERVICAL SPINE - 2-3 VIEW COMPARISON:  CT 12/27/2018. FINDINGS: Prior anterior cervical spine anterior interbody fusion as previously described. Lower cervical vertebra and hardware difficult visualized. C3-C4, C4-C5, C5-C6 posterior fusion. Anatomic alignment. 8 seconds fluoroscopy time. IMPRESSION: Postsurgical changes cervical spine as above. Electronically Signed   By: [place]iler   On: 12/28/2018 05:54    Assessment/Plan: POD 1 DCL fusion.  PT/OT TRH consult  LOS: 0 days     Korbyn Vanes P 12/28/2018, 8:09 AM

## 2018-12-28 NOTE — Op Note (Signed)
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  EBL: Minimal  History of present illness: 78 year old gentleman who previously undergone an ACDF from C3 C6 back in November initially was doing well had a fall the end of January and since then has had progressive quadriparesis. Workup been negative been admitted to the outside hospital observed and was getting rehabilitation however he continued decline should've been ER today underwent workup with MRI scan that showed severe cervical stenosis and cord compression primarily from posterior element hypertrophy and ligamentous hypertrophy however follow-up CT scan did also show possibly some migration of graft material anteriorly unclear. I felt the most the patient's stenosis was posterior however I also felt that a reexploration of a three-level anterior cervical anteriorly had a much higher risk and at this point I recommended posterior cervical decompression and fusion. I extensively went over the risks and benefits of the operation with the patient and patient's family as well as perioperative course expectations of outcome and alternatives of surgery and they understood and agreed to proceed 4.  Operative procedure: Patient brought into the or was induced on general anesthesia positioned prone in pins in the neck in slight flexion the back side his neck was prepped and draped in routine sterile fashion after infiltration of 10 mL lidocaine with epi a midline incision was made and Bovie light cautery was used to take down the subcutaneous  tissues and subperiosteal dissections care carried out from C2 down the C7 exposing the lateral masses from C3-C6. Interoperative x-ray confirmed the location L2-3 disc space so pilot holes were initially drilled inferomedial 8 lateral masses then usually utilizing 14 mm drill bit all the holes were drilled probed tapped and 814 mm globus ellipse lateral mass screws are placed all screws excellent purchase. After placement of all screws attention second decompression are removed the spinous processes at C3, C4, and C5 as well as super aspect C6 inferior aspect of C2. Central decompression was begun by marking laterally with a 2 minute Kerrison punch down each gutter and then unroofing the central lamina en bloc. I did that all 3 levels. There is a trismus stenosis and spinal cord compression primarily at C3-4 but also at C5-6. I under been removed in for aspect the C2 lamina super aspect C6 limited to confirm adequate decompression. I palpated the foramen and performed foraminotomies of the C4, C5, C6 nerve roots bilaterally. At the end of decompression there is no further stenosis centrally or foraminally Gelfoam was laid top of the dura I then aggressively decorticated the lateral masses and facet joints and packed with the locally harvested autograft mixed in the facet joints and posterior laterally along with BMP. I then attached all the rods anchored in place and placed a medium Hemovac drain and closed in layers with interrupted Vicryl and a running 4 subcuticular. Dermabond benzo and Steri-Strips and sterile dressing was applied patient recovered in stable condition. At the end of case all needle counts sponge counts were correct.

## 2018-12-28 NOTE — Evaluation (Signed)
Physical Therapy Evaluation Patient Details Name: Ralph Griffin MRN: [id number] DOB: 02/12/1941 Today's Date: 12/28/2018   History of Present Illness  78 yo admitted for C-6 posterior ACDF after fall in january with progressive quadraparesis. PMhx: C3-6 ACDF, DM, HTN  Clinical Impression  Pt very pleasant and eager to mobilize. Pt reports he was walking and doing well post surgery in November, had a fall in January and was fine for a few days but from Feb 2nd on has been unable to stand and in SNF after an acute stay. Pt reports 2 falls in SNF one when trying to stand pivot and one during a hoyer transfer. Pt with significant quadraparesis with grossly 2-/5 strength with decreased ROM all extremities increased tone particularly of LLE with inversion of left ankle. Pt with decreased strength, function, balance, sensation, mobility, and inability to care for himself. Pt with max +2 assist for bed level mobility and will benefit from acute therapy to maximize mobility, function, strength and safety to decrease burden of care. Requested PRAFO for pt as well as soft touch call button.      Follow Up Recommendations SNF;Supervision/Assistance - 24 hour    Equipment Recommendations  Hospital bed;None recommended by PT(hoyer)    Recommendations for Other Services       Precautions / Restrictions Precautions Precautions: Cervical;Fall Precaution Booklet Issued: No Required Braces or Orthoses: Cervical Brace Cervical Brace: Hard collar;At all times      Mobility  Bed Mobility Overal bed mobility: Needs Assistance Bed Mobility: Rolling;Sidelying to Sit;Sit to Sidelying Rolling: Max assist;+2 for physical assistance Sidelying to sit: Max assist;+2 for physical assistance     Sit to sidelying: Max assist;+2 for physical assistance General bed mobility comments: physical assist to bend knee, reach for rail and rotate pelvis with physical assist to maintain sidelying for pericare as pt  incontinent of stool and unaware with mobility. PHysical assist to bring legs off of bed and elevate trunk and to control descent to surface  Transfers                 General transfer comment: unsafe to attempt without lift equipment  Ambulation/Gait             General Gait Details: unable  Stairs            Wheelchair Mobility    Modified Rankin (Stroke Patients Only)       Balance Overall balance assessment: Needs assistance   Sitting balance-Leahy Scale: Zero Sitting balance - Comments: max assist with posterior lean in sitting and able to correct to midline briefly with max cues but unable to maintain with any movement of extremities. EOB grossly 5 min Postural control: Posterior lean                                   Pertinent Vitals/Pain Pain Assessment: No/denies pain    Home Living Family/patient expects to be discharged to:: Skilled nursing facility Living Arrangements: Spouse/significant other Available Help at Discharge: Family Type of Home: House Home Access: Stairs to enter   [employer] of Steps: 1 Home Layout: One level Home Equipment: Environmental consultant - 2 wheels;Bedside commode Additional Comments: pt reports 3 falls in the last year    Prior Function Level of Independence: Needs assistance   Gait / Transfers Assistance Needed: walked with RW after NOV surgery. Fall late Jan with weakness and inability to stand since Feb 2nd.  Has been max/total assist since then with hoyer lift OOB at SNF  ADL's / Homemaking Assistance Needed: wife does the homemaking and assists with bathing and dressing since ACDF, since Jan fall max assist progressing to need for feeding for grossly a week        Hand Dominance   Dominant Hand: Left    Extremity/Trunk Assessment   Upper Extremity Assessment Upper Extremity Assessment: RUE deficits/detail;LUE deficits/detail RUE Deficits / Details: grossly 15 degrees active shoulder  flexion, full ROM elbow with grossly 2+/5 strength, grip 2/5 with limited intrinsic ROM LUE Deficits / Details: grossly 30 degrees active shoulder flexion, elbow flexion 2+/5, grip 2/5    Lower Extremity Assessment Lower Extremity Assessment: RLE deficits/detail;LLE deficits/detail RLE Deficits / Details: 2/5 active dorsiflexion, 2-/5 knee extension, 2-/5 very limited knee flexion with increased tone with attempts at flexion LLE Deficits / Details: pt with active dorsiflexion with resting inversion, able to perform knee flexion 2/5 but unable to maintain with increased tone into extension on RLE with any movement of LLE, grossly 40 degrees hip flexion as pt with increased tone with hip flexion and functional mobility    Cervical / Trunk Assessment Cervical / Trunk Assessment: Other exceptions Cervical / Trunk Exceptions: post surgical, rounded shoulders, posterior lean in sitting  Communication   Communication: HOH  Cognition Arousal/Alertness: Awake/alert Behavior During Therapy: WFL for tasks assessed/performed Overall Cognitive Status: Impaired/Different from baseline Area of Impairment: Awareness;Safety/judgement                         Safety/Judgement: Decreased awareness of safety;Decreased awareness of deficits Awareness: Emergent   General Comments: pt incontinent of stool and unaware. does not have clear insight into his deficits and level of assist required      General Comments      Exercises     Assessment/Plan    PT Assessment Patient needs continued PT services  PT Problem List Decreased strength;Decreased balance;Decreased cognition;Decreased knowledge of precautions;Impaired tone;Decreased range of motion;Decreased mobility;Decreased knowledge of use of DME;Obesity;Decreased activity tolerance;Decreased coordination;Decreased safety awareness;Impaired sensation       PT Treatment Interventions DME instruction;Functional mobility training;Balance  training;Patient/family education;Therapeutic activities;Neuromuscular re-education;Wheelchair mobility training;Therapeutic exercise;Cognitive remediation    PT Goals (Current goals can be found in the Care Plan section)  Acute Rehab PT Goals Patient Stated Goal: be able to walk again PT Goal Formulation: With patient Time For Goal Achievement: 01/11/19 Potential to Achieve Goals: Fair    Frequency Min 4X/week   Barriers to discharge Decreased caregiver support      Co-evaluation               AM-PAC PT "6 Clicks" Mobility  Outcome Measure Help needed turning from your back to your side while in a flat bed without using bedrails?: Total Help needed moving from lying on your back to sitting on the side of a flat bed without using bedrails?: Total Help needed moving to and from a bed to a chair (including a wheelchair)?: Total Help needed standing up from a chair using your arms (e.g., wheelchair or bedside chair)?: Total Help needed to walk in hospital room?: Total Help needed climbing 3-5 steps with a railing? : Total 6 Click Score: 6    End of Session Equipment Utilized During Treatment: Cervical collar Activity Tolerance: Patient tolerated treatment well Patient left: in bed;with call bell/phone within reach;with nursing/sitter in room Nurse Communication: Mobility status;Need for lift equipment;Precautions PT Visit Diagnosis: Other abnormalities  of gait and mobility (R26.89);Muscle weakness (generalized) (M62.81);Other symptoms and signs involving the nervous system (R29.898)    Time: 2707-8675 PT Time Calculation (min) (ACUTE ONLY): 39 min   Charges:   PT Evaluation $PT Eval High Complexity: 1 High PT Treatments $Therapeutic Activity: 8-22 mins        Zyler Hyson Pam Drown, PT Acute Rehabilitation Services Pager: [phone] Office: [phone]   Samad Thon B Lorrie Strauch 12/28/2018, 8:31 AM

## 2018-12-28 NOTE — Anesthesia Postprocedure Evaluation (Signed)
Anesthesia Post Note  Patient: Alvino Lechuga  Procedure(s) Performed: Decompressive Cervical Laminectomy and Posterior Cervical Fusion Cervical three-four, four-five, five-six (N/A Neck)     Patient location during evaluation: PACU Anesthesia Type: General Level of consciousness: awake and alert Pain management: pain level controlled Vital Signs Assessment: post-procedure vital signs reviewed and stable Respiratory status: spontaneous breathing, nonlabored ventilation, respiratory function stable and patient connected to nasal cannula oxygen Cardiovascular status: blood pressure returned to baseline and stable Postop Assessment: no apparent nausea or vomiting Anesthetic complications: no    Last Vitals:  Vitals:   12/28/18 0251 12/28/18 0300  BP: 132/83 131/71  Pulse: (!) 113 (!) 104  Resp: 17 16  Temp: 36.5 C 36.6 C  SpO2: 94% 95%    Last Pain:  Vitals:   12/28/18 0300  TempSrc: Oral  PainSc:                  Josselin Gaulin

## 2018-12-28 NOTE — Clinical Social Work Note (Signed)
Clinical Social Work Assessment  Patient Details  Name: Ralph Griffin MRN: [id number] Date of Birth: 1941-02-17  Date of referral:  12/28/18               Reason for consult:  Facility Placement                Permission sought to share information with:  Family Supports, Customer service manager Permission granted to share information::  Yes, Verbal Permission Granted  Name::     Ralph Griffin  Agency::  SNF  Relationship::  wife  Contact Information:  [phone]  Housing/Transportation Living arrangements for the past 2 months:  [place], [place] of Information:  Patient Patient Interpreter Needed:  None Criminal Activity/Legal Involvement Pertinent to Current Situation/Hospitalization:  No - Comment as needed Significant Relationships:  Spouse Lives with:  Spouse Do you feel safe going back to the place where you live?  Yes Need for family participation in patient care:  Yes (Comment)  Care giving concerns:  Pt from [place]. He requires max assist with ADLs and IADLs.    Social Worker assessment / plan:  CSW spoke with pt at bedside. Introduced self, role, and reason for visit. Pt from SNF ([facility]) prior to that he was living at home with his wife. He is originally from [place] and is retired from [employer]. Pt states he is from SNF and is amenable to returning, he would like to see if he has any additional offers prior to committing. Pt very knowledgeable about insurance coverage, he understands limitations of coverage.   He is amenable to fax out to [facility] and [facility]Fs. Pt eager to continue working with PT/OT.  Employment status:  Retired Nurse, adult PT Recommendations:  [place], [place] / Referral to community resources:  [place]  Patient/Family's Response to care:  Pt amenable to speaking with CSW.  His background in insurance makes him knowledgeable regarding SNF process and authorization.   Patient/Family's Understanding of and Emotional Response to Diagnosis, Current Treatment, and Prognosis:  Pt states understanding of his diagnosis, current treatment and prognosis. He is eager to work with therapy as able and to continue with SNF. Pt pleasant and emotionally appropriate throughout assessment. Appears happy with care here at hospital.   Emotional Assessment Appearance:  Appears stated age Attitude/Demeanor/Rapport:  Engaged, Gracious Affect (typically observed):  Accepting, Adaptable, Appropriate, Pleasant Orientation:  Oriented to Situation, Oriented to  Time, Oriented to Place, Oriented to Self Alcohol / Substance use:  Not Applicable Psych involvement (Current and /or in the community):  No (Comment)  Discharge Needs  Concerns to be addressed:  Discharge Planning Concerns, Care Coordination Readmission within the last 30 days:  No Current discharge risk:  Dependent with Mobility, Physical Impairment Barriers to Discharge:  Continued Medical Work up, [employer], [place] 12/28/2018, 11:17 AM

## 2018-12-28 NOTE — Progress Notes (Signed)
Occupational Therapy Treatment Patient Details Name: Ralph Griffin MRN: [id number] DOB: 02/21/1941 Today's Date: 12/28/2018    History of present illness 78 yo admitted for C-6 posterior ACDF after fall in january with progressive quadraparesis. PMhx: C3-6 ACDF, DM, HTN   OT comments  Patient supine in bed, agreeable to OT.  Patient session focused on feeding.  Issued blue handled cup and red foam to assist patients independence with activity.  Using R hand, patient able to self scoop and feed applesauce (stablizing cup with L hand), initially with supervision and minimal spillage but fades as patient fatigues (after 3-4 bites).  Using B hands able to bring cup to mouth (using straw) with supervision.  Patient requires cueing throughout session to decrease speed of task, to increase coordination; but limited by proximal strength.  Educated Therapist, sports on use of AE for feeding, to assist as needed as patient fatigues but allow him to start each meal. Pt highly motivated.  Will continue to follow.    Follow Up Recommendations  SNF;Supervision/Assistance - 24 hour    Equipment Recommendations  Other (comment)(TBD at next venue of care)    Recommendations for Other Services      Precautions / Restrictions Precautions Precautions: Cervical;Fall Precaution Booklet Issued: No Required Braces or Orthoses: Cervical Brace Cervical Brace: Hard collar;At all times Restrictions Weight Bearing Restrictions: No       Mobility Bed Mobility               General bed mobility comments: scooting up in bed in trendelenburg position- max assist, as patient able to flex knees and assist by pushing this afternoon   Transfers                 General transfer comment: deferred    Balance                                           ADL either performed or assessed with clinical judgement   ADL Overall ADL's : Needs assistance/impaired Eating/Feeding: Minimal assistance;Bed  level(chair position bed level ) Eating/Feeding Details (indicate cue type and reason): issued red foam handles, used R hand to scoop and self feed applesauce (stabilizing cup with L hand), minimal spillage initallly but increased with fatigue (3-4 bites in), limited by proximal strength and coordination; issued blue handled cup and practiced reaching with B hands to grasp and bring to mouth with good success (using straw)--patient requires cueing to decreased speed and focus on control                                    General ADL Comments: bed level feeding in chair position only      Vision       Perception     Praxis      Cognition Arousal/Alertness: Awake/alert Behavior During Therapy: WFL for tasks assessed/performed Overall Cognitive Status: Impaired/Different from baseline Area of Impairment: Awareness;Safety/judgement                         Safety/Judgement: Decreased awareness of safety;Decreased awareness of deficits Awareness: Emergent            Exercises Exercises: General Upper Extremity General Exercises - Upper Extremity Elbow Flexion: AROM;5 reps;Both;Supine Elbow Extension: AROM;Both;5 reps;Supine Wrist Flexion: AROM;5 reps;Both;Supine Wrist  Extension: AROM;Both;5 reps;Supine Digit Composite Flexion: AROM;Both;5 reps;Supine Composite Extension: AROM;Both;5 reps;Supine   Shoulder Instructions       General Comments patient reports working on exercises provided this morning     Pertinent Vitals/ Pain       Pain Assessment: No/denies pain  Home Living                                          Prior Functioning/Environment              Frequency  Min 2X/week        Progress Toward Goals  OT Goals(current goals can now be found in the care plan section)  Progress towards OT goals: Progressing toward goals  Acute Rehab OT Goals Patient Stated Goal: to get my independence back OT Goal  Formulation: With patient Time For Goal Achievement: 01/11/19 Potential to Achieve Goals: Good  Plan Discharge plan remains appropriate;Frequency remains appropriate    Co-evaluation                 AM-PAC OT "6 Clicks" Daily Activity     Outcome Measure   Help from another person eating meals?: A Lot Help from another person taking care of personal grooming?: A Lot Help from another person toileting, which includes using toliet, bedpan, or urinal?: Total Help from another person bathing (including washing, rinsing, drying)?: Total Help from another person to put on and taking off regular upper body clothing?: Total Help from another person to put on and taking off regular lower body clothing?: Total 6 Click Score: 8    End of Session Equipment Utilized During Treatment: Cervical collar  OT Visit Diagnosis: Other abnormalities of gait and mobility (R26.89);Muscle weakness (generalized) (M62.81);Other symptoms and signs involving the nervous system (R29.898)   Activity Tolerance Patient tolerated treatment well   Patient Left in bed;with call bell/phone within reach;with bed alarm set   Nurse Communication Mobility status        Time: 4536-4680 OT Time Calculation (min): 22 min  Charges: OT General Charges $OT Visit: 1 Visit OT Treatments $Self Care/Home Management : 8-22 mins  Delight Stare, [place] Pager [phone] Office [phone]     Delight Stare 12/28/2018, 3:48 PM

## 2018-12-28 NOTE — Progress Notes (Signed)
PHARMACIST - PHYSICIAN COMMUNICATION  DR:   Saintclair Halsted  CONCERNING: IV to Oral Route Change Policy  RECOMMENDATION: This patient is receiving Pantoprazole by the intravenous route.  Based on criteria approved by the Pharmacy and Therapeutics Committee, the intravenous medication(s) is/are being converted to the equivalent oral dose form(s).   DESCRIPTION: These criteria include:  The patient is eating (either orally or via tube) and/or has been taking other orally administered medications for a least 24 hours  The patient has no evidence of active gastrointestinal bleeding or impaired GI absorption (gastrectomy, short bowel, patient on TNA or NPO).  If you have questions about this conversion, please contact the Pharmacy Department  []   [phone] )  [facility] []   [phone] )  [facility] [x]   [phone] )  [facility] []   [phone] )  [facility] []   [phone] )  [place], [facility] 12/28/2018 10:07 AM

## 2018-12-28 NOTE — Progress Notes (Signed)
Inpatient Diabetes Program Recommendations  AACE/ADA: New Consensus Statement on Inpatient Glycemic Control   Target Ranges:  Prepandial:   less than 140 mg/dL      Peak postprandial:   less than 180 mg/dL (1-2 hours)      Critically ill patients:  140 - 180 mg/dL   Results for Ralph Griffin, Ralph Griffin (MRN [id number]) as of 12/28/2018 15:16  Ref. Range 12/28/2018 02:21 12/28/2018 07:25  Glucose-Capillary Latest Ref Range: 70 - 99 mg/dL 165 (H) 178 (H)    Review of Glycemic Control  Diabetes history: DM2 Outpatient Diabetes medications: Glipizide XL 10 mg BID, Metformin XR 1000 mg BID, Victoza 1.2 mg QAM Current orders for Inpatient glycemic control: Glipizide XL 10 mg BID, Metformin XR 1000 mg BID, Victoza 1.2 mg QAM   Inpatient Diabetes Program Recommendations:   Correction (SSI): While inpatient, please consider ordering CBGs with Novolog 0-9 units TID with meals and Novolog 0-5 units QHS.  HbgA1C: Please consider ordering an A1C to evaluate glycemic control over the past 2-3 months.  Thanks, Barnie Alderman, RN, MSN, CDE Diabetes Coordinator Inpatient Diabetes Program [phone] (Team Pager from 8am to 5pm)

## 2018-12-28 NOTE — [place] FL2 (Signed)
[place] MEDICAID FL2 LEVEL OF CARE SCREENING TOOL     IDENTIFICATION  Patient Name: Ralph Griffin Birthdate: 11-27-1940 Sex: male Admission Date (Current Location): 12/27/2018  [facility] and [place] Number:  [employer] and Address:  [facility] [place], [place] [address] [place], [place] [address]      Provider Number: [id number]  Attending Physician Name and Address:  Kary Kos, MD  Relative Name and Phone Number:  Emoni Yang, wife, [phone]    Current Level of Care: Hospital Recommended Level of Care: [place] Prior Approval Number:    Date Approved/Denied:   PASRR Number: [id number]  Discharge Plan: SNF    Current Diagnoses: Patient Active Problem List   Diagnosis Date Noted  . Cervical stenosis of spine 12/28/2018  . Myelopathy ([place]) 12/28/2018    Orientation RESPIRATION BLADDER Height & Weight     Self, Time, Situation, Place  Normal Continent, Indwelling catheter Weight:   Height:     BEHAVIORAL SYMPTOMS/MOOD NEUROLOGICAL BOWEL NUTRITION STATUS      Continent Diet(see discharge summary)  AMBULATORY STATUS COMMUNICATION OF NEEDS Skin   Total Care Verbally Surgical wounds(incision on neck with honeycomb dressing)                       Personal Care Assistance Level of Assistance  Feeding, Bathing, Dressing Bathing Assistance: Maximum assistance Feeding assistance: Maximum assistance Dressing Assistance: Maximum assistance     Functional Limitations Info  Sight, Hearing, Speech Sight Info: Adequate Hearing Info: Adequate Speech Info: Adequate    SPECIAL CARE FACTORS FREQUENCY  PT (By licensed PT), OT (By licensed OT)     PT Frequency: 5x week OT Frequency: 5x week            Contractures Contractures Info: Not present    Additional Factors Info  Code Status, Allergies Code Status Info: Full Code Allergies Info: No Known Allergies            Current Medications (12/28/2018):  This  is the current hospital active medication list Current Facility-Administered Medications  Medication Dose Route Frequency Provider Last Rate Last Dose  . 0.9 %  sodium chloride infusion  250 mL Intravenous Continuous Kary Kos, MD   Stopped at 12/28/18 [phone]  . acetaminophen (TYLENOL) tablet 650 mg  650 mg Oral Q4H PRN Kary Kos, MD       Or  . acetaminophen (TYLENOL) suppository 650 mg  650 mg Rectal Q4H PRN Kary Kos, MD      . allopurinol (ZYLOPRIM) tablet 100 mg  100 mg Oral Daily Kary Kos, MD   100 mg at 12/28/18 0945  . alum & mag hydroxide-simeth (MAALOX/MYLANTA) 200-200-20 MG/5ML suspension 30 mL  30 mL Oral Q6H PRN Kary Kos, MD      . aspirin EC tablet 81 mg  81 mg Oral Daily Kary Kos, MD   81 mg at 12/28/18 0945  . atorvastatin (LIPITOR) tablet 20 mg  20 mg Oral QHS Kary Kos, MD      . calcium-vitamin D (OSCAL WITH D) 500-200 MG-UNIT per tablet 1 tablet  1 tablet Oral Daily Kary Kos, MD   1 tablet at 12/28/18 0946  . carvedilol (COREG) tablet 12.5 mg  12.5 mg Oral BID WC Kary Kos, MD   12.5 mg at 12/28/18 0946  . ceFAZolin (ANCEF) IVPB 2g/100 mL premix  2 g Intravenous Q8H Kary Kos, MD 200 mL/hr at 12/28/18 0539 2 g at 12/28/18 0539  .  cholecalciferol (VITAMIN D3) tablet 2,000 Units  2,000 Units Oral Daily Kary Kos, MD   2,000 Units at 12/28/18 0946  . cyclobenzaprine (FLEXERIL) tablet 10 mg  10 mg Oral TID PRN Kary Kos, MD      . gabapentin (NEURONTIN) capsule 100 mg  100 mg Oral TID Kary Kos, MD   100 mg at 12/28/18 0946  . glipiZIDE (GLUCOTROL XL) 24 hr tablet 10 mg  10 mg Oral BID WC Kary Kos, MD   10 mg at 12/28/18 0951  . hydrochlorothiazide (HYDRODIURIL) tablet 25 mg  25 mg Oral Daily Kary Kos, MD   25 mg at 12/28/18 0946  . HYDROmorphone (DILAUDID) injection 0.5 mg  0.5 mg Intravenous Q2H PRN Kary Kos, MD      . liraglutide (VICTOZA) SOPN 1.2 mg  1.2 mg Subcutaneous Myra Rude, MD      . menthol-cetylpyridinium (CEPACOL) lozenge 3 mg  1  lozenge Oral PRN Kary Kos, MD       Or  . phenol (CHLORASEPTIC) mouth spray 1 spray  1 spray Mouth/Throat PRN Kary Kos, MD      . metFORMIN (GLUCOPHAGE-XR) 24 hr tablet 1,000 mg  1,000 mg Oral BID WC Kary Kos, MD   1,000 mg at 12/28/18 0945  . multivitamin with minerals tablet 1 tablet  1 tablet Oral Daily Kary Kos, MD   1 tablet at 12/28/18 0946  . ondansetron (ZOFRAN) tablet 4 mg  4 mg Oral Q6H PRN Kary Kos, MD       Or  . ondansetron [facility]) injection 4 mg  4 mg Intravenous Q6H PRN Kary Kos, MD      . oxyCODONE (Oxy IR/ROXICODONE) immediate release tablet 10 mg  10 mg Oral Q3H PRN Kary Kos, MD      . pantoprazole (PROTONIX) EC tablet 40 mg  40 mg Oral QHS Kary Kos, MD      . sodium chloride flush (NS) 0.9 % injection 3 mL  3 mL Intravenous Q12H Kary Kos, MD   3 mL at 12/28/18 0947  . sodium chloride flush (NS) 0.9 % injection 3 mL  3 mL Intravenous PRN Kary Kos, MD      . vitamin B-12 (CYANOCOBALAMIN) tablet 1,000 mcg  1,000 mcg Oral Daily Kary Kos, MD   1,000 mcg at 12/28/18 1610     Discharge Medications: Please see discharge summary for a list of discharge medications.  Relevant Imaging Results:  Relevant Lab Results:   Additional Information SS#[id number]  [place], [place]

## 2018-12-29 ENCOUNTER — Encounter (HOSPITAL_COMMUNITY): Payer: Self-pay | Admitting: Neurosurgery

## 2018-12-29 LAB — GLUCOSE, CAPILLARY
Glucose-Capillary: 133 mg/dL — ABNORMAL HIGH (ref 70–99)
Glucose-Capillary: 234 mg/dL — ABNORMAL HIGH (ref 70–99)
Glucose-Capillary: 238 mg/dL — ABNORMAL HIGH (ref 70–99)
Glucose-Capillary: 153 mg/dL — ABNORMAL HIGH (ref 70–99)

## 2018-12-29 MED ORDER — INSULIN ASPART 100 UNIT/ML ~~LOC~~ SOLN
0.0000 [IU] | Freq: Three times a day (TID) | SUBCUTANEOUS | Status: DC
  Administered 2018-12-29 – 2018-12-30 (×2): 3 [IU] via SUBCUTANEOUS
  Administered 2018-12-30: 1 [IU] via SUBCUTANEOUS
  Administered 2018-12-31 (×2): 2 [IU] via SUBCUTANEOUS

## 2018-12-29 MED ORDER — INSULIN ASPART 100 UNIT/ML ~~LOC~~ SOLN: 0.0000 [IU] | Freq: Every day | SUBCUTANEOUS | Status: DC

## 2018-12-29 NOTE — Progress Notes (Addendum)
Subjective: Patient reports feels like he's gotten some improvement in his movements.   Objective: Vital signs in last 24 hours: Temp:  [97.7 F (36.5 C)-100.8 F (38.2 C)] 100.8 F (38.2 C) (02/19 1139) Pulse Rate:  [82-108] 85 (02/19 1139) Resp:  [18] 18 (02/19 1139) BP: (128-153)/(65-75) 153/66 (02/19 1139) SpO2:  [95 %-97 %] 97 % (02/19 1139)  Intake/Output from previous day: 02/18 0701 - 02/19 0700 In: 483 [P.O.:480; I.V.:3] Out: 760 [Urine:600; Drains:160] Intake/Output this shift: Total I/O In: 240 [P.O.:240] Out: 700 [Urine:700]  Neurologic: Upper extremities still 3/5, lower extremities 2/5  Lab Results: Lab Results  Component Value Date   WBC 10.4 12/27/2018   HGB 12.2 (L) 12/27/2018   HCT 38.8 (L) 12/27/2018   MCV 95.6 12/27/2018   PLT 323 12/27/2018   No results found for: INR, PROTIME BMET Lab Results  Component Value Date   NA 136 12/27/2018   K 3.5 12/27/2018   CL 104 12/27/2018   CO2 19 (L) 12/27/2018   GLUCOSE 273 (H) 12/27/2018   BUN 21 12/27/2018   CREATININE 1.00 12/27/2018   CALCIUM 8.8 (L) 12/27/2018    Studies/Results: Dg Cervical Spine 2-3 Views  Result Date: 12/28/2018 CLINICAL DATA:  Cervical laminectomy and fusion. EXAM: DG C-ARM 61-120 MIN; CERVICAL SPINE - 2-3 VIEW COMPARISON:  CT 12/27/2018. FINDINGS: Prior anterior cervical spine anterior interbody fusion as previously described. Lower cervical vertebra and hardware difficult visualized. C3-C4, C4-C5, C5-C6 posterior fusion. Anatomic alignment. 8 seconds fluoroscopy time. IMPRESSION: Postsurgical changes cervical spine as above. Electronically Signed   By: Marcello Moores  Register   On: 12/28/2018 05:54   Ct Cervical Spine Wo Contrast  Result Date: 12/27/2018 CLINICAL DATA:  78 year old male with progressive extremity weakness after a fall in January. Prior ACDF in November. Planned posterior decompression tonight. EXAM: CT CERVICAL SPINE WITHOUT CONTRAST TECHNIQUE: Multidetector CT  imaging of the cervical spine was performed without intravenous contrast. Multiplanar CT image reconstructions were also generated. COMPARISON:  Cervical spine MRI earlier today. Cervical spine CT 12/11/2018, and earlier. FINDINGS: Alignment: Stable cervical lordosis from earlier this month. Cervicothoracic junction alignment is within normal limits. Bilateral posterior element alignment is within normal limits. Skull base and vertebrae: Visualized skull base is intact. No atlanto-occipital dissociation. No cervical spine fracture identified. There is lucency along the C6 cortical screws (sagittal images 31 and 33). Each level is further described below. Soft tissues and spinal canal: Negative noncontrast neck soft tissues. Disc levels: C2-C3: Facet hypertrophy is moderate to severe and greater on the right. Trace right vacuum facet. Posterior ligamentous hypertrophy. Mild spinal stenosis as seen on the MRI earlier tonight. There is bilateral foraminal stenosis. C3-C4: Prior ACDF. The hardware appears intact. Posterior to the interbody implant there seems to be amorphous hyperdense material in the ventral canal (sagittal image 33) with several superimposed tiny foci of gas. This would correspond to the suggestion of some ventral filling defect on some of this sagittal MRI sequences today, and compound spinal stenosis in addition to the posterior ligamentous hypertrophy seen by MRI. C4-C5: Prior ACDF. Partially calcified ligament flavum hypertrophy at this level, but no other adverse features. C5-C6: Prior ACDF. C6 cortical screw loosening (sagittal image 33 and coronal image 22). Endplate spurring. Ligament flavum hypertrophy at this level was better demonstrated by MRI along with mild spinal stenosis. C6-C7: Mild endplate spurring. No spinal stenosis at this level by MRI. C7-T1:  Mostly anterior endplate spurring with vacuum phenomena. Upper chest: Visible upper thoracic levels appear intact.  Stable lung apices.  Other: Negative visualized noncontrast brain parenchyma. Calcified atherosclerosis at the skull base. Visualized paranasal sinuses and mastoids are stable and well pneumatized. IMPRESSION: 1. No cervical spine fracture identified, although there is bilateral C6 cortical screw loosening. 2. Mixed density, amorphous space-occupying material is suspected in the ventral canal at C3-C4 (sagittal image 33). This and might be related to the interbody implant, uncertain. Suspect this compounds the spinal stenosis at this level. 3. Mild spinal stenosis also at C2-C3 and C5-C6 as seen by MRI earlier today and in part due to the posterior ligamentous hypertrophy. 4. Study discussed by telephone with Dr. Saintclair Halsted on 12/27/2018 at 2324 hours. Electronically Signed   By: Genevie Ann M.D.   On: 12/27/2018 23:35   Mr Cervical Spine W Or Wo Contrast  Result Date: 12/27/2018 CLINICAL DATA:  Initial evaluation for acute and progressive myelopathy. EXAM: MRI CERVICAL AND THORACIC SPINE WITHOUT AND WITH CONTRAST TECHNIQUE: Multiplanar and multiecho pulse sequences of the cervical spine, to include the craniocervical junction and cervicothoracic junction, and the thoracic spine, were obtained without and with intravenous contrast. CONTRAST:  10 cc of Gadavist. COMPARISON:  None available. FINDINGS: MRI CERVICAL SPINE FINDINGS Alignment: Vertebral bodies normally aligned with preservation of the normal cervical lordosis. No listhesis. Vertebrae: Susceptibility artifact from prior ACDF at C3 through C6. Vertebral body heights maintained without evidence for acute or chronic fracture. Bone marrow signal intensity within normal limits. No discrete or worrisome osseous lesions. Reactive marrow edema with mild enhancement about the right greater than left C4-5 facets, most likely due to facet arthritis. Cord: Evaluation of the cervical spinal cord somewhat limited by body habitus and motion artifact vague T2 signal abnormality within the cervical  spinal cord at the level of C3-4, suspicious for edema and/or myelomalacia (series 4, image 10). Possible postcontrast enhancement, although difficult to be certain given adjacent susceptibility artifact from fusion hardware. Signal intensity within the cervical spinal cord otherwise within normal limits. Posterior Fossa, vertebral arteries, paraspinal tissues: Visualized brain and posterior fossa within normal limits. Craniocervical junction normal. Paraspinous and prevertebral soft tissues normal. Normal intravascular flow voids seen within the vertebral arteries bilaterally. Disc levels: C2-C3: Mild disc bulge. Right greater than left uncovertebral hypertrophy. Prominent right-sided facet degeneration. Resultant mild spinal stenosis without cord deformity. Severe right with mild left C3 foraminal narrowing. C3-C4: Prior fusion. Broad-based posterior osseous ridging flattens and effaces the ventral thecal sac. Superimposed facet and ligamentum flavum hypertrophy. Resultant severe spinal stenosis with compression of the cervical spinal cord. Thecal sac measures approximately 5 mm in AP diameter at its most narrow point. Severe bilateral C4 foraminal stenosis. C4-C5: Prior fusion. Small amount of enhancing T2 hyperintense signal intensity at the right ventral epidural space suspected to reflect postoperative granulation tissue (series 4, image 12). Facet and ligament flavum hypertrophy. Residual mild to moderate spinal stenosis without cord compression. Residual uncovertebral spurring with resultant severe bilateral C5 foraminal stenosis. C5-C6: Prior fusion. Residual posterior osseous ridging flattens and effaces the ventral thecal sac. Residual mild moderate spinal stenosis without cord compression. Severe bilateral C6 foraminal stenosis. C6-C7: Right eccentric disc osteophyte with intervertebral disc space narrowing, with right greater than left uncovertebral hypertrophy. Flattening of the ventral thecal sac  without significant spinal stenosis. Moderate right with mild left C7 foraminal narrowing. C7-T1: Mild disc bulge with facet hypertrophy. No significant spinal stenosis. Foramina are grossly patent. MRI THORACIC SPINE FINDINGS Alignment: Mild exaggeration of the normal thoracic kyphosis. No listhesis or malalignment. Vertebrae: Vertebral body  heights maintained without evidence for acute or chronic fracture. Bone marrow signal intensity within normal limits. Multiple scattered benign hemangiomas noted throughout the thoracic spine, most prominent of which measures 2.3 cm at the L1 vertebral body. No worrisome osseous lesions. No abnormal marrow edema or enhancement. Cord: Signal intensity within the thoracic spinal cord is normal. Normal cord caliber morphology. Conus medullaris terminates at approximately the L1 level. Paraspinal and other soft tissues: Paraspinous soft tissues demonstrate no acute finding. Partially visualized lungs are grossly clear. Visualized visceral structures unremarkable. Disc levels: Minimal disc bulge noted at T7-8 without significant stenosis. Mild scattered posterior element hypertrophy noted within the lower thoracic spine. No other significant degenerative changes identified. No canal or neural foraminal stenosis. No impingement. IMPRESSION: MRI CERVICAL SPINE IMPRESSION 1. Multifactorial degenerative changes at C3-4 with resultant severe spinal stenosis with cord impingement. Subtle T2 signal abnormality within the cord at this level suspicious for edema and/or myelomalacia. 2. Prior ACDF at C3-C6, with residual mild to moderate diffuse spinal stenosis elsewhere at these levels. 3. Multifactorial degenerative changes with resultant severe right C3 foraminal stenosis, with severe bilateral C4 through C6 foraminal narrowing, and moderate right C7 foraminal stenosis. MRI THORACIC SPINE IMPRESSION No acute abnormality within the thoracic spine. No significant stenosis or evidence for  cord compression. Critical Value/emergent results were called by telephone at the time of interpretation on 12/27/2018 at 9:25 pm to Dr. Noemi Chapel , who verbally acknowledged these results. Electronically Signed   By: Jeannine Boga M.D.   On: 12/27/2018 21:29   Mr Thoracic Spine W Wo Contrast  Result Date: 12/27/2018 CLINICAL DATA:  Initial evaluation for acute and progressive myelopathy. EXAM: MRI CERVICAL AND THORACIC SPINE WITHOUT AND WITH CONTRAST TECHNIQUE: Multiplanar and multiecho pulse sequences of the cervical spine, to include the craniocervical junction and cervicothoracic junction, and the thoracic spine, were obtained without and with intravenous contrast. CONTRAST:  10 cc of Gadavist. COMPARISON:  None available. FINDINGS: MRI CERVICAL SPINE FINDINGS Alignment: Vertebral bodies normally aligned with preservation of the normal cervical lordosis. No listhesis. Vertebrae: Susceptibility artifact from prior ACDF at C3 through C6. Vertebral body heights maintained without evidence for acute or chronic fracture. Bone marrow signal intensity within normal limits. No discrete or worrisome osseous lesions. Reactive marrow edema with mild enhancement about the right greater than left C4-5 facets, most likely due to facet arthritis. Cord: Evaluation of the cervical spinal cord somewhat limited by body habitus and motion artifact vague T2 signal abnormality within the cervical spinal cord at the level of C3-4, suspicious for edema and/or myelomalacia (series 4, image 10). Possible postcontrast enhancement, although difficult to be certain given adjacent susceptibility artifact from fusion hardware. Signal intensity within the cervical spinal cord otherwise within normal limits. Posterior Fossa, vertebral arteries, paraspinal tissues: Visualized brain and posterior fossa within normal limits. Craniocervical junction normal. Paraspinous and prevertebral soft tissues normal. Normal intravascular flow  voids seen within the vertebral arteries bilaterally. Disc levels: C2-C3: Mild disc bulge. Right greater than left uncovertebral hypertrophy. Prominent right-sided facet degeneration. Resultant mild spinal stenosis without cord deformity. Severe right with mild left C3 foraminal narrowing. C3-C4: Prior fusion. Broad-based posterior osseous ridging flattens and effaces the ventral thecal sac. Superimposed facet and ligamentum flavum hypertrophy. Resultant severe spinal stenosis with compression of the cervical spinal cord. Thecal sac measures approximately 5 mm in AP diameter at its most narrow point. Severe bilateral C4 foraminal stenosis. C4-C5: Prior fusion. Small amount of enhancing T2 hyperintense signal intensity at the  right ventral epidural space suspected to reflect postoperative granulation tissue (series 4, image 12). Facet and ligament flavum hypertrophy. Residual mild to moderate spinal stenosis without cord compression. Residual uncovertebral spurring with resultant severe bilateral C5 foraminal stenosis. C5-C6: Prior fusion. Residual posterior osseous ridging flattens and effaces the ventral thecal sac. Residual mild moderate spinal stenosis without cord compression. Severe bilateral C6 foraminal stenosis. C6-C7: Right eccentric disc osteophyte with intervertebral disc space narrowing, with right greater than left uncovertebral hypertrophy. Flattening of the ventral thecal sac without significant spinal stenosis. Moderate right with mild left C7 foraminal narrowing. C7-T1: Mild disc bulge with facet hypertrophy. No significant spinal stenosis. Foramina are grossly patent. MRI THORACIC SPINE FINDINGS Alignment: Mild exaggeration of the normal thoracic kyphosis. No listhesis or malalignment. Vertebrae: Vertebral body heights maintained without evidence for acute or chronic fracture. Bone marrow signal intensity within normal limits. Multiple scattered benign hemangiomas noted throughout the thoracic  spine, most prominent of which measures 2.3 cm at the L1 vertebral body. No worrisome osseous lesions. No abnormal marrow edema or enhancement. Cord: Signal intensity within the thoracic spinal cord is normal. Normal cord caliber morphology. Conus medullaris terminates at approximately the L1 level. Paraspinal and other soft tissues: Paraspinous soft tissues demonstrate no acute finding. Partially visualized lungs are grossly clear. Visualized visceral structures unremarkable. Disc levels: Minimal disc bulge noted at T7-8 without significant stenosis. Mild scattered posterior element hypertrophy noted within the lower thoracic spine. No other significant degenerative changes identified. No canal or neural foraminal stenosis. No impingement. IMPRESSION: MRI CERVICAL SPINE IMPRESSION 1. Multifactorial degenerative changes at C3-4 with resultant severe spinal stenosis with cord impingement. Subtle T2 signal abnormality within the cord at this level suspicious for edema and/or myelomalacia. 2. Prior ACDF at C3-C6, with residual mild to moderate diffuse spinal stenosis elsewhere at these levels. 3. Multifactorial degenerative changes with resultant severe right C3 foraminal stenosis, with severe bilateral C4 through C6 foraminal narrowing, and moderate right C7 foraminal stenosis. MRI THORACIC SPINE IMPRESSION No acute abnormality within the thoracic spine. No significant stenosis or evidence for cord compression. Critical Value/emergent results were called by telephone at the time of interpretation on 12/27/2018 at 9:25 pm to Dr. Noemi Chapel , who verbally acknowledged these results. Electronically Signed   By: Jeannine Boga M.D.   On: 12/27/2018 21:29   Dg C-arm 1-60 Min  Result Date: 12/28/2018 CLINICAL DATA:  Cervical laminectomy and fusion. EXAM: DG C-ARM 61-120 MIN; CERVICAL SPINE - 2-3 VIEW COMPARISON:  CT 12/27/2018. FINDINGS: Prior anterior cervical spine anterior interbody fusion as previously  described. Lower cervical vertebra and hardware difficult visualized. C3-C4, C4-C5, C5-C6 posterior fusion. Anatomic alignment. 8 seconds fluoroscopy time. IMPRESSION: Postsurgical changes cervical spine as above. Electronically Signed   By: Marcello Moores  Register   On: 12/28/2018 05:54    Assessment/Plan:  Postop day 2 from posterior cervical, doing ok. Continue therapies. SNF when bed available.    LOS: 1 day    Ralph Griffin 12/29/2018, 12:24 PM   Agree with above patient was significant improvement in upper extremity strength with 4-5 grips bilaterally well over antigravity in his legs. Incision clean dry and intact

## 2018-12-29 NOTE — Progress Notes (Signed)
Physical Therapy Treatment Patient Details Name: Ralph Griffin MRN: [id number] DOB: 1941/07/14 Today's Date: 12/29/2018    History of Present Illness 78 yo admitted for C-6 posterior ACDF after fall in january with progressive quadraparesis. PMhx: C3-6 ACDF, DM, HTN    PT Comments    Pt was able to sit EOB longer today and tolerate working on sitting balance and posture.  We discussed getting OOB to chair tomorrow via maxi move and he said that would be good (I was afraid he might be afraid of the lift as it sounded like from the chart review that he fell when using the hoyer at SNF, but he said it was a fluke).  I discussed at length with wife appropriate exercises she could do with him as well as how to help him self feed using the OT equipment left in the room.  Wife is hopeful for more recovery as is patient.  PT will continue to follow acutely for safe mobility progression   Follow Up Recommendations  SNF;Supervision/Assistance - 24 hour     Equipment Recommendations  Wheelchair (measurements PT);Wheelchair cushion (measurements PT);Hospital bed;Other (comment)(hoyer)    Recommendations for Other Services   NA     Precautions / Restrictions Precautions Precautions: Cervical;Fall Precaution Comments: pt is able to report his cervical precautions Required Braces or Orthoses: Cervical Brace Cervical Brace: Hard collar;At all times    Mobility  Bed Mobility Overal bed mobility: Needs Assistance Bed Mobility: Rolling;Sidelying to Sit;Sit to Sidelying Rolling: +2 for physical assistance;Total assist Sidelying to sit: +2 for physical assistance;Total assist     Sit to sidelying: +2 for physical assistance;Total assist General bed mobility comments: Two person total assist to roll to his right side, sit up from side lying and return to side lying after being EOB.  Significant assist needed for all transitions.  Pt attempting to help, but unable to help significantly with his  mobility at this time.   Transfers                 General transfer comment: safest to lift OOB to chair with maxi move total lift.  Pt agreeable to try lift OOB to chair tomorrow (he is not afraid of the lift despite having a fall related to the hoyer at [facility]).   Ambulation/Gait             General Gait Details: unable at this time.           Balance Overall balance assessment: Needs assistance Sitting-balance support: Feet supported;Bilateral upper extremity supported;Single extremity supported Sitting balance-Leahy Scale: Zero Sitting balance - Comments: max assist EOB with R UE securely proped, assist needed frequently to reposition weaker left UE in posterior extension.  In sitting worked on upright posture, engaging his trunk muscles and weight shifting.   Postural control: Posterior lean                                  Cognition Arousal/Alertness: Awake/alert Behavior During Therapy: WFL for tasks assessed/performed Overall Cognitive Status: Within Functional Limits for tasks assessed                                 General Comments: Not specifically tested         General Comments General comments (skin integrity, edema, etc.): After returning to supine, assisted pt with hand over  hand assist with feeding himself using the gripers and cup that OT left earlier in the day.  I educated pt's wife on helping him eat, not feeding him.  We also discussed re-starting the UE and LE ROM program that was given to them by the SNF PT with re-emphasis to have him do the movement as best he can not just doing it for him.  Pt positioned in slight sidelying at end of session to remove pressure from his bottom.       Pertinent Vitals/Pain Pain Assessment: Faces Faces Pain Scale: Hurts even more Pain Location: posterior neck in sitting Pain Descriptors / Indicators: Grimacing;Guarding Pain Intervention(s): Limited activity within patient's  tolerance;Monitored during session;Repositioned           PT Goals (current goals can now be found in the care plan section) Acute Rehab PT Goals Patient Stated Goal: to get my independence back Progress towards PT goals: Progressing toward goals    Frequency    Min 4X/week      PT Plan Current plan remains appropriate       AM-PAC PT "6 Clicks" Mobility   Outcome Measure  Help needed turning from your back to your side while in a flat bed without using bedrails?: Total Help needed moving from lying on your back to sitting on the side of a flat bed without using bedrails?: Total Help needed moving to and from a bed to a chair (including a wheelchair)?: Total Help needed standing up from a chair using your arms (e.g., wheelchair or bedside chair)?: Total Help needed to walk in hospital room?: Total Help needed climbing 3-5 steps with a railing? : Total 6 Click Score: 6    End of Session Equipment Utilized During Treatment: Cervical collar Activity Tolerance: Patient limited by pain Patient left: in bed;with call bell/phone within reach   PT Visit Diagnosis: Other abnormalities of gait and mobility (R26.89);Muscle weakness (generalized) (M62.81);Other symptoms and signs involving the nervous system (H72.902)     Time: 1115-5208 PT Time Calculation (min) (ACUTE ONLY): 49 min  Charges:  $Therapeutic Activity: 23-37 mins $Self Care/Home Management: 8-22                    Anquanette Bahner B. Deron Poole, PT, DPT  Acute Rehabilitation [phone] pager #[phone] office   12/29/2018, 5:35 PM

## 2018-12-29 NOTE — Social Work (Addendum)
2:28pm- reached pt daughter Neoma Laming, pt has requested to return to [facility], will submit evaluations to facility to initiate insurance auth  1:04pm- CSW spoke with pt, pt daughter and pt wife at bedside. Presented them with SNF offers CMS list. They will review and give choice to CSW today. When we have choice can start insurance approval with [employer].   Westley Hummer, MSW, [place] Work [phone]

## 2018-12-30 ENCOUNTER — Inpatient Hospital Stay (HOSPITAL_COMMUNITY): Payer: Medicare HMO

## 2018-12-30 LAB — MRSA PCR SCREENING: MRSA by PCR: NEGATIVE

## 2018-12-30 LAB — GLUCOSE, CAPILLARY
GLUCOSE-CAPILLARY: 214 mg/dL — AB (ref 70–99)
Glucose-Capillary: 125 mg/dL — ABNORMAL HIGH (ref 70–99)
Glucose-Capillary: 68 mg/dL — ABNORMAL LOW (ref 70–99)
Glucose-Capillary: 128 mg/dL — ABNORMAL HIGH (ref 70–99)
Glucose-Capillary: 199 mg/dL — ABNORMAL HIGH (ref 70–99)

## 2018-12-30 MED ORDER — ROPINIROLE HCL ER 4 MG PO TB24
4.0000 mg | ORAL_TABLET | Freq: Every day | ORAL | Status: DC
  Filled 2018-12-30: qty 1

## 2018-12-30 MED ORDER — ROPINIROLE HCL 1 MG PO TABS
4.0000 mg | ORAL_TABLET | Freq: Every day | ORAL | Status: DC
  Administered 2018-12-30: 4 mg via ORAL
  Filled 2018-12-30 (×2): qty 4

## 2018-12-30 NOTE — Progress Notes (Signed)
Physical Therapy Treatment Patient Details Name: Ralph Griffin MRN: [id number] DOB: 07-01-1941 Today's Date: 12/30/2018    History of Present Illness 78 yo admitted for C-6 posterior ACDF after fall in january with progressive quadraparesis. PMhx: C3-6 ACDF, DM, HTN    PT Comments    Pt does demonstrate improved UE strength today and is very eager to work hard with therapy.  We utilized the maxi move total lift to get OOB to the recliner chair and then worked from [employer] chair on sitting unsupported and LE exercises. We attempted to stand in the electric Sara Plus standing frame, but it was too much tension on his UE (pulling into his neck), so we did not continue these efforts.  He remains appropriate for post acute rehab at discharge.    Follow Up Recommendations  SNF;Supervision/Assistance - 24 hour     Equipment Recommendations  Wheelchair (measurements PT);Wheelchair cushion (measurements PT);Hospital bed;Other (comment)(hoyer lift)    Recommendations for Other Services   NA     Precautions / Restrictions Precautions Precautions: Cervical;Fall Cervical Brace: Hard collar;At all times    Mobility  Bed Mobility Overal bed mobility: Needs Assistance Bed Mobility: Rolling Rolling: +2 for physical assistance;Max assist         General bed mobility comments: Two person max assist to roll bil for placement of lift pad.  Pt able to reach across his body with arms to attempt to pull and needs assist flexing his knees up to push, but is helping more today than previously.   Transfers Overall transfer level: Needs assistance               General transfer comment: Maxi sky used for OOB time and then we worked from the chair on sitting balance as this was a more firm, level surface than the bed.          Balance Overall balance assessment: Needs assistance Sitting-balance support: Feet supported;Bilateral upper extremity supported Sitting balance-Leahy Scale:  Poor Sitting balance - Comments: need min to mod assist in sitting, support of hands.  Pt able to work from chair with back un supported on weight shiftting forward and back, upright posture, finding midline and scap retractions.  Postural control: Posterior lean                                  Cognition Arousal/Alertness: Awake/alert Behavior During Therapy: WFL for tasks assessed/performed Overall Cognitive Status: Within Functional Limits for tasks assessed                                        Exercises General Exercises - Lower Extremity Long Arc Quad: AROM;Both;10 reps Hip Flexion/Marching: AAROM;Left;10 reps Toe Raises: AROM;Both;10 reps    General Comments General comments (skin integrity, edema, etc.): VSS throughout.        Pertinent Vitals/Pain Pain Assessment: Faces Faces Pain Scale: Hurts even more Pain Location: posterior neck in sitting Pain Descriptors / Indicators: Grimacing;Guarding Pain Intervention(s): Limited activity within patient's tolerance;Monitored during session;Repositioned           PT Goals (current goals can now be found in the care plan section) Acute Rehab PT Goals Patient Stated Goal: to get my independence back Progress towards PT goals: Progressing toward goals    Frequency    Min 5X/week  PT Plan Current plan remains appropriate;Frequency needs to be updated       AM-PAC PT "6 Clicks" Mobility   Outcome Measure  Help needed turning from your back to your side while in a flat bed without using bedrails?: Total Help needed moving from lying on your back to sitting on the side of a flat bed without using bedrails?: Total Help needed moving to and from a bed to a chair (including a wheelchair)?: Total Help needed standing up from a chair using your arms (e.g., wheelchair or bedside chair)?: Total Help needed to walk in hospital room?: Total Help needed climbing 3-5 steps with a railing? :  Total 6 Click Score: 6    End of Session Equipment Utilized During Treatment: Cervical collar Activity Tolerance: Patient limited by pain Patient left: in chair;with call bell/phone within reach;with chair alarm set   PT Visit Diagnosis: Other abnormalities of gait and mobility (R26.89);Muscle weakness (generalized) (M62.81);Other symptoms and signs involving the nervous system (R29.898)     Time: 1430-1520 PT Time Calculation (min) (ACUTE ONLY): 50 min  Charges:  $Therapeutic Activity: 23-37 mins $Neuromuscular Re-education: 8-22 mins                    Keena Dinse B. Carleah Yablonski, PT, DPT  Acute Rehabilitation [phone] pager #[phone] office   12/30/2018, 4:36 PM

## 2018-12-30 NOTE — Progress Notes (Signed)
Subjective: Patient reports still do much better significant improvement arms and hands.  Objective: Vital signs in last 24 hours: Temp:  [97.7 F (36.5 C)-100.8 F (38.2 C)] 98.3 F (36.8 C) (02/20 0300) Pulse Rate:  [82-108] 82 (02/19 1624) Resp:  [18] 18 (02/19 1139) BP: (118-153)/(63-77) 118/77 (02/20 0300) SpO2:  [95 %-98 %] 98 % (02/19 1900)  Intake/Output from previous day: 02/19 0701 - 02/20 0700 In: 610 [P.O.:610] Out: 1365 [Urine:1325; Drains:40] Intake/Output this shift: No intake/output data recorded.  strength 4-5 upper and lower extremities incision clean dry and intact  Lab Results: Recent Labs    12/27/18 1350  WBC 10.4  HGB 12.2*  HCT 38.8*  PLT 323   BMET Recent Labs    12/27/18 1350  NA 136  K 3.5  CL 104  CO2 19*  GLUCOSE 273*  BUN 21  CREATININE 1.00  CALCIUM 8.8*    Studies/Results: No results found.  Assessment/Plan: Postop day 3 posterior cervical decompression fusion doing much better. We'll DC his Hemovac patient seems a little short of breath will check a chest x-ray.  LOS: 2 days     Nyonna Hargrove P 12/30/2018, 7:40 AM

## 2018-12-30 NOTE — Consult Note (Signed)
[facility] Medical Consultation  Ralph Griffin FUX:[id number] DOB: [birth year] DOA: 12/27/2018 PCP: Raina Mina., MD   Requesting physician: Kary Kos MD   Date of consultation: 2/20/20202 Reason for consultation: Multiple medical problems.  Manage hypertension and diabetes.  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  Postsurgical management as per surgery.  Patient is on hard collar.  Continue PT OT.  Patient will benefit with inpatient therapies and recommended skilled nursing facility.  Currently pain well managed on oral narcotics.  Type 2 diabetes: Patient was on glipizide and metformin at home.  He is on sliding scale insulin here.  His blood sugar was 60 in the morning, now blood sugar has stabilized.  He will eat regular diet and continue glipizide metformin.  Hypertension: Very well controlled on current regimen.  Restless leg syndrome: Patient has significant symptoms at night.  He benefited with Requip in the past.  Will start on low-dose of Requip 4 mg at night.  Paroxysmal A. fib: Rate controlled.  No acute symptoms.  Patient is stable on current medical management. Ordered chemistry and CBC for morning. Chest x-ray was reviewed, essentially normal.  we will followup again tomorrow. Please contact me if I can be of assistance in the meanwhile. Thank you for this consultation.  Chief Complaint: Has multiple medical issues.  HPI:  Patient with history of type 2 diabetes, hypertension, restless leg syndrome who has been recently suffering from stenosis of the cervical spine, he had posterior instrumentation in November 2019 and was at rehab.  Patient had progressive weakness of all extremities and was seen at [facility] and then transferred to neurosurgery at [facility].  Patient was found with progressive myelopathy and  underwentDecompressive laminectomy and posterior fusion on 12/28/2018.  He has multiple medical problems for their fairly controlled. Today on examination, patient was very optimistic about regaining his strength on his right arm, he also has regained his strength on his left arm.  His legs are still weak, however patient stated they are better.  Only complaint today is he has restless leg syndrome and "legs dance all night".  Blood sugars were 60 in the morning, however he had no symptoms.  Review of Systems:  Minimal neck pain, leg movements at night.  Weakness of the lower extremities more than upper extremities. No fever chills.  Denies any shortness of breath or wheezing.  Denies any chest pain.  Past Medical History:  Diagnosis Date  . Diabetes mellitus without complication ([place])   . Hypertension    Past Surgical History:  Procedure Laterality Date  . CERVICAL DISCECTOMY    . POSTERIOR CERVICAL LAMINECTOMY N/A 12/27/2018   Procedure: Decompressive Cervical Laminectomy and Posterior Cervical Fusion Cervical three-four, four-five, five-six;  Surgeon: Kary Kos, MD;  Location: [facility];  Service: Neurosurgery;  Laterality: N/A;   Social History:  reports that he has never smoked. He has never used smokeless tobacco. He reports previous alcohol use. No history on file for drug.  No Known Allergies History reviewed. No pertinent family history.  Prior to Admission medications   Medication Sig Start Date End Date Taking? Authorizing Provider  allopurinol (ZYLOPRIM) 100 MG tablet Take 100 mg by mouth daily. 11/01/18  Yes [provider]  aspirin EC 81 MG tablet Take 81 mg by mouth daily.   Yes [provider]  atorvastatin (LIPITOR) 20 MG tablet Take 1  tablet by mouth at bedtime. 10/19/18  Yes [provider]  carvedilol (COREG) 12.5 MG tablet Take 1 tablet by mouth 2 (two) times daily. 11/01/18  Yes [provider]  cholecalciferol (VITAMIN D3) 25 MCG  (1000 UT) tablet Take 2,000 Units by mouth daily.   Yes [provider]  diclofenac (VOLTAREN) 75 MG EC tablet Take 1 tablet by mouth 2 (two) times daily as needed for pain. 11/30/18  Yes [provider]  gabapentin (NEURONTIN) 100 MG capsule Take 100 mg by mouth 3 (three) times daily.   Yes [provider]  glipiZIDE (GLUCOTROL XL) 10 MG 24 hr tablet Take 1 tablet by mouth 2 (two) times daily. 11/01/18  Yes [provider]  hydrochlorothiazide (HYDRODIURIL) 25 MG tablet Take 25 mg by mouth daily. 11/01/18  Yes [provider]  metFORMIN (GLUCOPHAGE-XR) 500 MG 24 hr tablet Take 1,000 mg by mouth 2 (two) times daily. 11/01/18  Yes [provider]  Multiple Vitamin (MULTIVITAMIN WITH MINERALS) TABS tablet Take 1 tablet by mouth daily.   Yes [provider]  OS-CAL CALCIUM + D3 500-200 MG-UNIT TABS Take 1 tablet by mouth every morning. 09/24/18  Yes [provider]  VICTOZA 18 MG/3ML SOPN Inject 1.2 mg into the skin every morning.  11/17/18  Yes [provider]  vitamin B-12 (CYANOCOBALAMIN) 1000 MCG tablet Take 1,000 mcg by mouth daily.   Yes [provider]   Physical Exam: Blood pressure 114/61, pulse 81, temperature 98.5 F (36.9 C), temperature source Oral, resp. rate 16, SpO2 99 %. Vitals:   12/30/18 1120 12/30/18 1545  BP: 115/66 114/61  Pulse: 96 81  Resp: 16 16  Temp: 98.2 F (36.8 C) 98.5 F (36.9 C)  SpO2: 97% 99%     General: Patient is well-built, comfortable on room air.  Eyes: EOMI, PERRLA, no drainage  ENT: Moist mucous membranes, no oropharyngeal lesions  Neck: Supple, on hard collar, no palpable lymphadenopathy  Cardiovascular: S1-S2 normal.  No murmurs rubs or gallops.  Respiratory: Bilateral clear.  No added sounds no wheezing or crepitations.  Abdomen: Soft nontender bowel sounds present  Skin: Intact, no lesions or ulcers  Musculoskeletal: No edema, no cyanosis or  clubbing.  Psychiatric: Alert oriented x3.  Normal mood and affect.  Neurologic: Cranial nerves II to XII normal.  Right upper extremity 5/5, left upper extremity 4/5.  Bilateral lower extremities 3/5.  Labs on Admission:  Basic Metabolic Panel: Recent Labs  Lab 12/27/18 1350  NA 136  K 3.5  CL 104  CO2 19*  GLUCOSE 273*  BUN 21  CREATININE 1.00  CALCIUM 8.8*   Liver Function Tests: No results for input(s): AST, ALT, ALKPHOS, BILITOT, PROT, ALBUMIN in the last 168 hours. No results for input(s): LIPASE, AMYLASE in the last 168 hours. Recent Labs  Lab 12/27/18 1702  AMMONIA 10   CBC: Recent Labs  Lab 12/27/18 1350  WBC 10.4  HGB 12.2*  HCT 38.8*  MCV 95.6  PLT 323   Cardiac Enzymes: No results for input(s): CKTOTAL, CKMB, CKMBINDEX, TROPONINI in the last 168 hours. BNP: Invalid input(s): POCBNP CBG: Recent Labs  Lab 12/29/18 2148 12/30/18 0739 12/30/18 0822 12/30/18 1121 12/30/18 1544  GLUCAP 153* 68* 125* 128* 214*    Radiological Exams on Admission: Dg Chest 1 View  Result Date: 12/30/2018 CLINICAL DATA:  Shortness of breath. EXAM: CHEST  1 VIEW COMPARISON:  Radiographs of December 24, 2018. FINDINGS: Stable cardiomediastinal silhouette. Mild left lingular subsegmental  atelectasis is noted. Mild right basilar subsegmental atelectasis is noted. No pneumothorax or pleural effusion is noted. The visualized skeletal structures are unremarkable. IMPRESSION: Mild left lingular subsegmental atelectasis with mild right basilar subsegmental atelectasis. Electronically Signed   By: Marijo Conception, M.D.   On: 12/30/2018 09:19      Time spent: 40 minutes.  Barb Merino [facility] Pager [phone]  If 7PM-7AM, please contact night-coverage [email or web address] Password [facility] 12/30/2018, 5:26 PM

## 2018-12-30 NOTE — Progress Notes (Signed)
Rehab Admissions Coordinator Note:  Per Family request, this patient was screened by Jhonnie Garner for appropriateness for an Inpatient Acute Rehab Consult.  At this time, we are recommending [facility] and agree with current therapy recommendations. Feel pt will need an extended stay in rehab. AC will contact family and communicate recommendation for SNF.   Please call if questions.   Jhonnie Garner 12/30/2018, 11:07 AM  I can be reached at [phone].

## 2018-12-31 DIAGNOSIS — I10 Essential (primary) hypertension: Secondary | ICD-10-CM

## 2018-12-31 DIAGNOSIS — E118 Type 2 diabetes mellitus with unspecified complications: Secondary | ICD-10-CM

## 2018-12-31 DIAGNOSIS — G2581 Restless legs syndrome: Secondary | ICD-10-CM

## 2018-12-31 DIAGNOSIS — G959 Disease of spinal cord, unspecified: Secondary | ICD-10-CM

## 2018-12-31 DIAGNOSIS — M4802 Spinal stenosis, cervical region: Secondary | ICD-10-CM

## 2018-12-31 LAB — CBC WITH DIFFERENTIAL/PLATELET
Abs Immature Granulocytes: 0.04 10*3/uL (ref 0.00–0.07)
BASOS ABS: 0.1 10*3/uL (ref 0.0–0.1)
Basophils Relative: 1 %
EOS PCT: 2 %
Eosinophils Absolute: 0.2 10*3/uL (ref 0.0–0.5)
HCT: 33.2 % — ABNORMAL LOW (ref 39.0–52.0)
Hemoglobin: 10.7 g/dL — ABNORMAL LOW (ref 13.0–17.0)
Immature Granulocytes: 0 %
LYMPHS PCT: 12 %
Lymphs Abs: 1.3 10*3/uL (ref 0.7–4.0)
MCH: 30.1 pg (ref 26.0–34.0)
MCHC: 32.2 g/dL (ref 30.0–36.0)
MCV: 93.5 fL (ref 80.0–100.0)
Monocytes Absolute: 1.1 10*3/uL — ABNORMAL HIGH (ref 0.1–1.0)
Monocytes Relative: 10 %
Neutro Abs: 8.3 10*3/uL — ABNORMAL HIGH (ref 1.7–7.7)
Neutrophils Relative %: 75 %
Platelets: 275 10*3/uL (ref 150–400)
RBC: 3.55 MIL/uL — AB (ref 4.22–5.81)
RDW: 12.2 % (ref 11.5–15.5)
WBC: 11 10*3/uL — AB (ref 4.0–10.5)
nRBC: 0 % (ref 0.0–0.2)

## 2018-12-31 LAB — BASIC METABOLIC PANEL
Anion gap: 9 (ref 5–15)
BUN: 23 mg/dL (ref 8–23)
CO2: 26 mmol/L (ref 22–32)
CREATININE: 1 mg/dL (ref 0.61–1.24)
Calcium: 8.4 mg/dL — ABNORMAL LOW (ref 8.9–10.3)
Chloride: 99 mmol/L (ref 98–111)
GFR calc non Af Amer: >60 mL/min (ref >60)
Glucose, Bld: 176 mg/dL — ABNORMAL HIGH (ref 70–99)
Potassium: 3.6 mmol/L (ref 3.5–5.1)
SODIUM: 134 mmol/L — AB (ref 135–145)

## 2018-12-31 LAB — GLUCOSE, CAPILLARY
GLUCOSE-CAPILLARY: 196 mg/dL — AB (ref 70–99)
Glucose-Capillary: 174 mg/dL — ABNORMAL HIGH (ref 70–99)

## 2018-12-31 MED ORDER — METHOCARBAMOL 500 MG PO TABS: 500.0000 mg | ORAL_TABLET | Freq: Four times a day (QID) | ORAL | 0 refills | Status: DC

## 2018-12-31 MED ORDER — INSULIN ASPART 100 UNIT/ML ~~LOC~~ SOLN
4.0000 [IU] | Freq: Three times a day (TID) | SUBCUTANEOUS | Status: DC
  Administered 2018-12-31: 4 [IU] via SUBCUTANEOUS

## 2018-12-31 MED ORDER — HYDROCODONE-ACETAMINOPHEN 5-325 MG PO TABS: 1.0000 | ORAL_TABLET | Freq: Four times a day (QID) | ORAL | 0 refills | Status: DC | PRN

## 2018-12-31 NOTE — Progress Notes (Signed)
Gabe RN being pulled from unit. Report received back from [place] at 1445.

## 2018-12-31 NOTE — Social Work (Signed)
Clinical Social Worker facilitated patient discharge including contacting patient family and facility to confirm patient discharge plans.  Clinical information faxed to facility and family agreeable with plan.  CSW arranged ambulance transport via [facility] to [facility].  RN to call [phone]  with report prior to discharge.  Clinical Social Worker will sign off for now as social work intervention is no longer needed. Please consult [place] again if new need arises.  Westley Hummer, MSW, [place] Social Worker [phone]

## 2018-12-31 NOTE — Progress Notes (Signed)
Report and hand off given to [facility]

## 2018-12-31 NOTE — Progress Notes (Signed)
Physical Therapy Treatment Patient Details Name: Ralph Griffin MRN: [id number] DOB: August 12, 1941 Today's Date: 12/31/2018    History of Present Illness 78 yo admitted for C-6 posterior ACDF after fall in january with progressive quadraparesis. PMhx: C3-6 ACDF, DM, HTN    PT Comments    Pt is a bit sore in his posterior neck from working with therapy yesterday in the chair.  He was agreeable to sit EOB today before planned d/c to SNF level rehab.  We again, worked on sitting balance, upright posture, LE exercises and scap retractions EOB.  It was harder for him to keep his sitting balance EOB (more compliant air mattress surface) than in the chair (more firm).  He tolerated >10 mins up and is excited to get on with his rehab and recovery.  PT to follow acutely until d/c confirmed.    Follow Up Recommendations  SNF;Supervision/Assistance - 24 hour     Equipment Recommendations  Wheelchair (measurements PT);Wheelchair cushion (measurements PT);Hospital bed;Other (comment)(hoyer)    Recommendations for Other Services   NA     Precautions / Restrictions Precautions Precautions: Cervical;Fall    Mobility  Bed Mobility Overal bed mobility: Needs Assistance Bed Mobility: Rolling;Sidelying to Sit;Sit to Sidelying Rolling: +2 for physical assistance;Max assist Sidelying to sit: +2 for physical assistance;Total assist     Sit to sidelying: +2 for physical assistance;Total assist General bed mobility comments: Two person max assist to roll cues to reach across his body, assist with flexion of knees.  Most assist needed at trunk to come to sitting EOB and with trunk and legs when reutrning to sidelying/supine.   Transfers                 General transfer comment: Pt is due to d/c to SNF, so we did not transfer OOB to recliner chair today via maix move total lift.          Balance Overall balance assessment: Needs assistance Sitting-balance support: Feet supported;Bilateral  upper extremity supported Sitting balance-Leahy Scale: Poor Sitting balance - Comments: mod to max assist in sitting EOB with assist to manually stabilize his arms in tripod position.  Cues for using his head and arms to help shift weight forward/back.  Stretching of upper chest and back with therapist's manual assist and pt helping at the same time with scap retractions.  Pt needed more assist in sitting on this more compliant surface compared to yesterday from the chair.  Postural control: Posterior lean;Right lateral lean                                  Cognition Arousal/Alertness: Awake/alert Behavior During Therapy: WFL for tasks assessed/performed Overall Cognitive Status: Within Functional Limits for tasks assessed                                        Exercises General Exercises - Lower Extremity Long Arc Quad: AROM;Both;10 reps Hip Flexion/Marching: AAROM;Both;10 reps    General Comments General comments (skin integrity, edema, etc.): Pt's strength is slowly improving, most obvious in his UEs as his left arm is doing more against gravity.  R arm and left leg remain his strongest extremities.       Pertinent Vitals/Pain Pain Assessment: Faces Faces Pain Scale: Hurts even more Pain Location: Posterior neck, sore in general from working yesterday  with PT Pain Descriptors / Indicators: Grimacing;Guarding Pain Intervention(s): Limited activity within patient's tolerance;Monitored during session;Repositioned           PT Goals (current goals can now be found in the care plan section) Acute Rehab PT Goals Patient Stated Goal: to get my independence back Progress towards PT goals: Progressing toward goals    Frequency    Min 5X/week      PT Plan Current plan remains appropriate       AM-PAC PT "6 Clicks" Mobility   Outcome Measure  Help needed turning from your back to your side while in a flat bed without using bedrails?:  Total Help needed moving from lying on your back to sitting on the side of a flat bed without using bedrails?: Total Help needed moving to and from a bed to a chair (including a wheelchair)?: Total Help needed standing up from a chair using your arms (e.g., wheelchair or bedside chair)?: Total Help needed to walk in hospital room?: Total Help needed climbing 3-5 steps with a railing? : Total 6 Click Score: 6    End of Session Equipment Utilized During Treatment: Cervical collar Activity Tolerance: Patient limited by pain Patient left: in bed;with call bell/phone within reach;with bed alarm set Nurse Communication: Mobility status PT Visit Diagnosis: Other abnormalities of gait and mobility (R26.89);Muscle weakness (generalized) (M62.81);Other symptoms and signs involving the nervous system (R29.898)     Time: 1400-1430 PT Time Calculation (min) (ACUTE ONLY): 30 min  Charges:  $Therapeutic Activity: 23-37 mins          Verdean Murin B. Khyan Oats, PT, DPT  Acute Rehabilitation [phone] pager #[phone] office            12/31/2018, 4:32 PM

## 2018-12-31 NOTE — Discharge Summary (Signed)
Physician Discharge Summary  Patient ID: Ralph Griffin MRN: [id number] DOB/AGE: 1941-07-21 78 y.o.  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 The patient tolerated the procedure well and was taken to the recovery room and then to the floor in stable condition. The hospital course was routine. There were no complications. The wound remained clean dry and intact. Pt had appropriate neck soreness. No complaints of arm pain or new N/T/W. The patient remained afebrile with stable vital signs, and tolerated a regular diet. The patient continued to increase activities, and pain was well controlled with oral pain medications.   Consults: None  Significant Diagnostic Studies:  Results for orders placed or performed during the hospital encounter of 12/27/18  Urine Culture  Result Value Ref Range   Specimen Description URINE, CLEAN CATCH    Special Requests      NONE Performed at [facility], [address] [place], [place] [address]    Culture      Multiple bacterial morphotypes present, none predominant. Suggest appropriate recollection if clinically indicated.   Report Status 12/28/2018 FINAL   MRSA PCR Screening  Result Value Ref Range   MRSA by PCR NEGATIVE NEGATIVE  Basic metabolic panel  Result Value Ref Range   Sodium 136 135 - 145 mmol/L   Potassium 3.5 3.5 - 5.1 mmol/L   Chloride 104 98 - 111 mmol/L   CO2 19 (L) 22 - 32 mmol/L   Glucose, Bld 273 (H) 70 - 99 mg/dL   BUN 21 8 - 23 mg/dL   Creatinine, Ser 1.00 0.61 - 1.24 mg/dL   Calcium 8.8 (L) 8.9 - 10.3 mg/dL   GFR calc non Af Amer >60 >60 mL/min   GFR calc Af Amer >60  >60 mL/min   Anion gap 13 5 - 15  CBC  Result Value Ref Range   WBC 10.4 4.0 - 10.5 K/uL   RBC 4.06 (L) 4.22 - 5.81 MIL/uL   Hemoglobin 12.2 (L) 13.0 - 17.0 g/dL   HCT 38.8 (L) 39.0 - 52.0 %   MCV 95.6 80.0 - 100.0 fL   MCH 30.0 26.0 - 34.0 pg   MCHC 31.4 30.0 - 36.0 g/dL   RDW 12.4 11.5 - 15.5 %   Platelets 323 150 - 400 K/uL   nRBC 0.0 0.0 - 0.2 %  Sedimentation rate  Result Value Ref Range   Sed Rate 55 (H) 0 - 16 mm/hr  Ammonia  Result Value Ref Range   Ammonia 10 9 - 35 umol/L  Urinalysis, Routine w reflex microscopic  Result Value Ref Range   Color, Urine YELLOW YELLOW   APPearance HAZY (A) CLEAR   Specific Gravity, Urine 1.014 1.005 - 1.030   pH 5.0 5.0 - 8.0   Glucose, UA 50 (A) NEGATIVE mg/dL   Hgb urine dipstick NEGATIVE NEGATIVE   Bilirubin Urine NEGATIVE NEGATIVE   Ketones, ur 5 (A) NEGATIVE mg/dL   Protein, ur NEGATIVE NEGATIVE mg/dL   Nitrite NEGATIVE NEGATIVE   Leukocytes,Ua MODERATE (A) NEGATIVE   RBC / HPF 6-10 0 - 5 RBC/hpf   WBC, UA 11-20 0 - 5 WBC/hpf   Bacteria, UA RARE (A) NONE SEEN   Squamous Epithelial /  LPF 0-5 0 - 5   Mucus PRESENT    Hyaline Casts, UA PRESENT   Glucose, capillary  Result Value Ref Range   Glucose-Capillary 165 (H) 70 - 99 mg/dL  Glucose, capillary  Result Value Ref Range   Glucose-Capillary 178 (H) 70 - 99 mg/dL  Glucose, capillary  Result Value Ref Range   Glucose-Capillary 251 (H) 70 - 99 mg/dL   Comment 1 Notify RN    Comment 2 Document in Chart   Glucose, capillary  Result Value Ref Range   Glucose-Capillary 221 (H) 70 - 99 mg/dL   Comment 1 Notify RN    Comment 2 Document in Chart   Glucose, capillary  Result Value Ref Range   Glucose-Capillary 148 (H) 70 - 99 mg/dL  Glucose, capillary  Result Value Ref Range   Glucose-Capillary 133 (H) 70 - 99 mg/dL  Glucose, capillary  Result Value Ref Range   Glucose-Capillary 234 (H) 70 - 99 mg/dL  Glucose, capillary  Result Value Ref Range   Glucose-Capillary 238  (H) 70 - 99 mg/dL  Glucose, capillary  Result Value Ref Range   Glucose-Capillary 153 (H) 70 - 99 mg/dL  Glucose, capillary  Result Value Ref Range   Glucose-Capillary 125 (H) 70 - 99 mg/dL  Glucose, capillary  Result Value Ref Range   Glucose-Capillary 68 (L) 70 - 99 mg/dL  Glucose, capillary  Result Value Ref Range   Glucose-Capillary 128 (H) 70 - 99 mg/dL  Glucose, capillary  Result Value Ref Range   Glucose-Capillary 214 (H) 70 - 99 mg/dL  CBC with Differential/Platelet  Result Value Ref Range   WBC 11.0 (H) 4.0 - 10.5 K/uL   RBC 3.55 (L) 4.22 - 5.81 MIL/uL   Hemoglobin 10.7 (L) 13.0 - 17.0 g/dL   HCT 33.2 (L) 39.0 - 52.0 %   MCV 93.5 80.0 - 100.0 fL   MCH 30.1 26.0 - 34.0 pg   MCHC 32.2 30.0 - 36.0 g/dL   RDW 12.2 11.5 - 15.5 %   Platelets 275 150 - 400 K/uL   nRBC 0.0 0.0 - 0.2 %   Neutrophils Relative % 75 %   Neutro Abs 8.3 (H) 1.7 - 7.7 K/uL   Lymphocytes Relative 12 %   Lymphs Abs 1.3 0.7 - 4.0 K/uL   Monocytes Relative 10 %   Monocytes Absolute 1.1 (H) 0.1 - 1.0 K/uL   Eosinophils Relative 2 %   Eosinophils Absolute 0.2 0.0 - 0.5 K/uL   Basophils Relative 1 %   Basophils Absolute 0.1 0.0 - 0.1 K/uL   Immature Granulocytes 0 %   Abs Immature Granulocytes 0.04 0.00 - 0.07 K/uL  Basic metabolic panel  Result Value Ref Range   Sodium 134 (L) 135 - 145 mmol/L   Potassium 3.6 3.5 - 5.1 mmol/L   Chloride 99 98 - 111 mmol/L   CO2 26 22 - 32 mmol/L   Glucose, Bld 176 (H) 70 - 99 mg/dL   BUN 23 8 - 23 mg/dL   Creatinine, Ser 1.00 0.61 - 1.24 mg/dL   Calcium 8.4 (L) 8.9 - 10.3 mg/dL   GFR calc non Af Amer >60 >60 mL/min   GFR calc Af Amer >60 >60 mL/min   Anion gap 9 5 - 15  Glucose, capillary  Result Value Ref Range   Glucose-Capillary 199 (H) 70 - 99 mg/dL  Glucose, capillary  Result Value Ref Range   Glucose-Capillary 196 (H) 70 - 99 mg/dL    Dg Chest  1 View  Result Date: 12/30/2018 CLINICAL DATA:  Shortness of breath. EXAM: CHEST  1 VIEW COMPARISON:   Radiographs of December 24, 2018. FINDINGS: Stable cardiomediastinal silhouette. Mild left lingular subsegmental atelectasis is noted. Mild right basilar subsegmental atelectasis is noted. No pneumothorax or pleural effusion is noted. The visualized skeletal structures are unremarkable. IMPRESSION: Mild left lingular subsegmental atelectasis with mild right basilar subsegmental atelectasis. Electronically Signed   By: Marijo Conception, M.D.   On: 12/30/2018 09:19   Dg Cervical Spine 2-3 Views  Result Date: 12/28/2018 CLINICAL DATA:  Cervical laminectomy and fusion. EXAM: DG C-ARM 61-120 MIN; CERVICAL SPINE - 2-3 VIEW COMPARISON:  CT 12/27/2018. FINDINGS: Prior anterior cervical spine anterior interbody fusion as previously described. Lower cervical vertebra and hardware difficult visualized. C3-C4, C4-C5, C5-C6 posterior fusion. Anatomic alignment. 8 seconds fluoroscopy time. IMPRESSION: Postsurgical changes cervical spine as above. Electronically Signed   By: Marcello Moores  Register   On: 12/28/2018 05:54   Ct Cervical Spine Wo Contrast  Result Date: 12/27/2018 CLINICAL DATA:  78 year old male with progressive extremity weakness after a fall in January. Prior ACDF in November. Planned posterior decompression tonight. EXAM: CT CERVICAL SPINE WITHOUT CONTRAST TECHNIQUE: Multidetector CT imaging of the cervical spine was performed without intravenous contrast. Multiplanar CT image reconstructions were also generated. COMPARISON:  Cervical spine MRI earlier today. Cervical spine CT 12/11/2018, and earlier. FINDINGS: Alignment: Stable cervical lordosis from earlier this month. Cervicothoracic junction alignment is within normal limits. Bilateral posterior element alignment is within normal limits. Skull base and vertebrae: Visualized skull base is intact. No atlanto-occipital dissociation. No cervical spine fracture identified. There is lucency along the C6 cortical screws (sagittal images 31 and 33). Each level is  further described below. Soft tissues and spinal canal: Negative noncontrast neck soft tissues. Disc levels: C2-C3: Facet hypertrophy is moderate to severe and greater on the right. Trace right vacuum facet. Posterior ligamentous hypertrophy. Mild spinal stenosis as seen on the MRI earlier tonight. There is bilateral foraminal stenosis. C3-C4: Prior ACDF. The hardware appears intact. Posterior to the interbody implant there seems to be amorphous hyperdense material in the ventral canal (sagittal image 33) with several superimposed tiny foci of gas. This would correspond to the suggestion of some ventral filling defect on some of this sagittal MRI sequences today, and compound spinal stenosis in addition to the posterior ligamentous hypertrophy seen by MRI. C4-C5: Prior ACDF. Partially calcified ligament flavum hypertrophy at this level, but no other adverse features. C5-C6: Prior ACDF. C6 cortical screw loosening (sagittal image 33 and coronal image 22). Endplate spurring. Ligament flavum hypertrophy at this level was better demonstrated by MRI along with mild spinal stenosis. C6-C7: Mild endplate spurring. No spinal stenosis at this level by MRI. C7-T1:  Mostly anterior endplate spurring with vacuum phenomena. Upper chest: Visible upper thoracic levels appear intact. Stable lung apices. Other: Negative visualized noncontrast brain parenchyma. Calcified atherosclerosis at the skull base. Visualized paranasal sinuses and mastoids are stable and well pneumatized. IMPRESSION: 1. No cervical spine fracture identified, although there is bilateral C6 cortical screw loosening. 2. Mixed density, amorphous space-occupying material is suspected in the ventral canal at C3-C4 (sagittal image 33). This and might be related to the interbody implant, uncertain. Suspect this compounds the spinal stenosis at this level. 3. Mild spinal stenosis also at C2-C3 and C5-C6 as seen by MRI earlier today and in part due to the posterior  ligamentous hypertrophy. 4. Study discussed by telephone with Dr. Saintclair Halsted on 12/27/2018 at 2324 hours.  Electronically Signed   By: Genevie Ann M.D.   On: 12/27/2018 23:35   Mr Cervical Spine W Or Wo Contrast  Result Date: 12/27/2018 CLINICAL DATA:  Initial evaluation for acute and progressive myelopathy. EXAM: MRI CERVICAL AND THORACIC SPINE WITHOUT AND WITH CONTRAST TECHNIQUE: Multiplanar and multiecho pulse sequences of the cervical spine, to include the craniocervical junction and cervicothoracic junction, and the thoracic spine, were obtained without and with intravenous contrast. CONTRAST:  10 cc of Gadavist. COMPARISON:  None available. FINDINGS: MRI CERVICAL SPINE FINDINGS Alignment: Vertebral bodies normally aligned with preservation of the normal cervical lordosis. No listhesis. Vertebrae: Susceptibility artifact from prior ACDF at C3 through C6. Vertebral body heights maintained without evidence for acute or chronic fracture. Bone marrow signal intensity within normal limits. No discrete or worrisome osseous lesions. Reactive marrow edema with mild enhancement about the right greater than left C4-5 facets, most likely due to facet arthritis. Cord: Evaluation of the cervical spinal cord somewhat limited by body habitus and motion artifact vague T2 signal abnormality within the cervical spinal cord at the level of C3-4, suspicious for edema and/or myelomalacia (series 4, image 10). Possible postcontrast enhancement, although difficult to be certain given adjacent susceptibility artifact from fusion hardware. Signal intensity within the cervical spinal cord otherwise within normal limits. Posterior Fossa, vertebral arteries, paraspinal tissues: Visualized brain and posterior fossa within normal limits. Craniocervical junction normal. Paraspinous and prevertebral soft tissues normal. Normal intravascular flow voids seen within the vertebral arteries bilaterally. Disc levels: C2-C3: Mild disc bulge. Right greater  than left uncovertebral hypertrophy. Prominent right-sided facet degeneration. Resultant mild spinal stenosis without cord deformity. Severe right with mild left C3 foraminal narrowing. C3-C4: Prior fusion. Broad-based posterior osseous ridging flattens and effaces the ventral thecal sac. Superimposed facet and ligamentum flavum hypertrophy. Resultant severe spinal stenosis with compression of the cervical spinal cord. Thecal sac measures approximately 5 mm in AP diameter at its most narrow point. Severe bilateral C4 foraminal stenosis. C4-C5: Prior fusion. Small amount of enhancing T2 hyperintense signal intensity at the right ventral epidural space suspected to reflect postoperative granulation tissue (series 4, image 12). Facet and ligament flavum hypertrophy. Residual mild to moderate spinal stenosis without cord compression. Residual uncovertebral spurring with resultant severe bilateral C5 foraminal stenosis. C5-C6: Prior fusion. Residual posterior osseous ridging flattens and effaces the ventral thecal sac. Residual mild moderate spinal stenosis without cord compression. Severe bilateral C6 foraminal stenosis. C6-C7: Right eccentric disc osteophyte with intervertebral disc space narrowing, with right greater than left uncovertebral hypertrophy. Flattening of the ventral thecal sac without significant spinal stenosis. Moderate right with mild left C7 foraminal narrowing. C7-T1: Mild disc bulge with facet hypertrophy. No significant spinal stenosis. Foramina are grossly patent. MRI THORACIC SPINE FINDINGS Alignment: Mild exaggeration of the normal thoracic kyphosis. No listhesis or malalignment. Vertebrae: Vertebral body heights maintained without evidence for acute or chronic fracture. Bone marrow signal intensity within normal limits. Multiple scattered benign hemangiomas noted throughout the thoracic spine, most prominent of which measures 2.3 cm at the L1 vertebral body. No worrisome osseous lesions. No  abnormal marrow edema or enhancement. Cord: Signal intensity within the thoracic spinal cord is normal. Normal cord caliber morphology. Conus medullaris terminates at approximately the L1 level. Paraspinal and other soft tissues: Paraspinous soft tissues demonstrate no acute finding. Partially visualized lungs are grossly clear. Visualized visceral structures unremarkable. Disc levels: Minimal disc bulge noted at T7-8 without significant stenosis. Mild scattered posterior element hypertrophy noted within the lower thoracic spine. No  other significant degenerative changes identified. No canal or neural foraminal stenosis. No impingement. IMPRESSION: MRI CERVICAL SPINE IMPRESSION 1. Multifactorial degenerative changes at C3-4 with resultant severe spinal stenosis with cord impingement. Subtle T2 signal abnormality within the cord at this level suspicious for edema and/or myelomalacia. 2. Prior ACDF at C3-C6, with residual mild to moderate diffuse spinal stenosis elsewhere at these levels. 3. Multifactorial degenerative changes with resultant severe right C3 foraminal stenosis, with severe bilateral C4 through C6 foraminal narrowing, and moderate right C7 foraminal stenosis. MRI THORACIC SPINE IMPRESSION No acute abnormality within the thoracic spine. No significant stenosis or evidence for cord compression. Critical Value/emergent results were called by telephone at the time of interpretation on 12/27/2018 at 9:25 pm to Dr. Noemi Chapel , who verbally acknowledged these results. Electronically Signed   By: Jeannine Boga M.D.   On: 12/27/2018 21:29   Mr Thoracic Spine W Wo Contrast  Result Date: 12/27/2018 CLINICAL DATA:  Initial evaluation for acute and progressive myelopathy. EXAM: MRI CERVICAL AND THORACIC SPINE WITHOUT AND WITH CONTRAST TECHNIQUE: Multiplanar and multiecho pulse sequences of the cervical spine, to include the craniocervical junction and cervicothoracic junction, and the thoracic spine,  were obtained without and with intravenous contrast. CONTRAST:  10 cc of Gadavist. COMPARISON:  None available. FINDINGS: MRI CERVICAL SPINE FINDINGS Alignment: Vertebral bodies normally aligned with preservation of the normal cervical lordosis. No listhesis. Vertebrae: Susceptibility artifact from prior ACDF at C3 through C6. Vertebral body heights maintained without evidence for acute or chronic fracture. Bone marrow signal intensity within normal limits. No discrete or worrisome osseous lesions. Reactive marrow edema with mild enhancement about the right greater than left C4-5 facets, most likely due to facet arthritis. Cord: Evaluation of the cervical spinal cord somewhat limited by body habitus and motion artifact vague T2 signal abnormality within the cervical spinal cord at the level of C3-4, suspicious for edema and/or myelomalacia (series 4, image 10). Possible postcontrast enhancement, although difficult to be certain given adjacent susceptibility artifact from fusion hardware. Signal intensity within the cervical spinal cord otherwise within normal limits. Posterior Fossa, vertebral arteries, paraspinal tissues: Visualized brain and posterior fossa within normal limits. Craniocervical junction normal. Paraspinous and prevertebral soft tissues normal. Normal intravascular flow voids seen within the vertebral arteries bilaterally. Disc levels: C2-C3: Mild disc bulge. Right greater than left uncovertebral hypertrophy. Prominent right-sided facet degeneration. Resultant mild spinal stenosis without cord deformity. Severe right with mild left C3 foraminal narrowing. C3-C4: Prior fusion. Broad-based posterior osseous ridging flattens and effaces the ventral thecal sac. Superimposed facet and ligamentum flavum hypertrophy. Resultant severe spinal stenosis with compression of the cervical spinal cord. Thecal sac measures approximately 5 mm in AP diameter at its most narrow point. Severe bilateral C4 foraminal  stenosis. C4-C5: Prior fusion. Small amount of enhancing T2 hyperintense signal intensity at the right ventral epidural space suspected to reflect postoperative granulation tissue (series 4, image 12). Facet and ligament flavum hypertrophy. Residual mild to moderate spinal stenosis without cord compression. Residual uncovertebral spurring with resultant severe bilateral C5 foraminal stenosis. C5-C6: Prior fusion. Residual posterior osseous ridging flattens and effaces the ventral thecal sac. Residual mild moderate spinal stenosis without cord compression. Severe bilateral C6 foraminal stenosis. C6-C7: Right eccentric disc osteophyte with intervertebral disc space narrowing, with right greater than left uncovertebral hypertrophy. Flattening of the ventral thecal sac without significant spinal stenosis. Moderate right with mild left C7 foraminal narrowing. C7-T1: Mild disc bulge with facet hypertrophy. No significant spinal stenosis. Foramina are grossly  patent. MRI THORACIC SPINE FINDINGS Alignment: Mild exaggeration of the normal thoracic kyphosis. No listhesis or malalignment. Vertebrae: Vertebral body heights maintained without evidence for acute or chronic fracture. Bone marrow signal intensity within normal limits. Multiple scattered benign hemangiomas noted throughout the thoracic spine, most prominent of which measures 2.3 cm at the L1 vertebral body. No worrisome osseous lesions. No abnormal marrow edema or enhancement. Cord: Signal intensity within the thoracic spinal cord is normal. Normal cord caliber morphology. Conus medullaris terminates at approximately the L1 level. Paraspinal and other soft tissues: Paraspinous soft tissues demonstrate no acute finding. Partially visualized lungs are grossly clear. Visualized visceral structures unremarkable. Disc levels: Minimal disc bulge noted at T7-8 without significant stenosis. Mild scattered posterior element hypertrophy noted within the lower thoracic spine.  No other significant degenerative changes identified. No canal or neural foraminal stenosis. No impingement. IMPRESSION: MRI CERVICAL SPINE IMPRESSION 1. Multifactorial degenerative changes at C3-4 with resultant severe spinal stenosis with cord impingement. Subtle T2 signal abnormality within the cord at this level suspicious for edema and/or myelomalacia. 2. Prior ACDF at C3-C6, with residual mild to moderate diffuse spinal stenosis elsewhere at these levels. 3. Multifactorial degenerative changes with resultant severe right C3 foraminal stenosis, with severe bilateral C4 through C6 foraminal narrowing, and moderate right C7 foraminal stenosis. MRI THORACIC SPINE IMPRESSION No acute abnormality within the thoracic spine. No significant stenosis or evidence for cord compression. Critical Value/emergent results were called by telephone at the time of interpretation on 12/27/2018 at 9:25 pm to Dr. Noemi Chapel , who verbally acknowledged these results. Electronically Signed   By: Jeannine Boga M.D.   On: 12/27/2018 21:29   Dg C-arm 1-60 Min  Result Date: 12/28/2018 CLINICAL DATA:  Cervical laminectomy and fusion. EXAM: DG C-ARM 61-120 MIN; CERVICAL SPINE - 2-3 VIEW COMPARISON:  CT 12/27/2018. FINDINGS: Prior anterior cervical spine anterior interbody fusion as previously described. Lower cervical vertebra and hardware difficult visualized. C3-C4, C4-C5, C5-C6 posterior fusion. Anatomic alignment. 8 seconds fluoroscopy time. IMPRESSION: Postsurgical changes cervical spine as above. Electronically Signed   By: Marcello Moores  Register   On: 12/28/2018 05:54    Antibiotics:  Anti-infectives (From admission, onward)   Start     Dose/Rate Route Frequency Ordered Stop   12/28/18 0600  ceFAZolin (ANCEF) IVPB 2g/100 mL premix     2 g 200 mL/hr over 30 Minutes Intravenous Every 8 hours 12/28/18 0316 12/28/18 1532   12/28/18 0022  bacitracin 50,000 Units in sodium chloride 0.9 % 500 mL irrigation  Status:   Discontinued       As needed 12/28/18 0023 12/28/18 0214      Discharge Exam: Blood pressure 121/64, pulse (!) 58, temperature 98.2 F (36.8 C), temperature source Oral, resp. rate 20, SpO2 93 %. Neurologic: Grossly normal Upper extremities 4/5, lower extremities 3/5   Discharge Medications:   Allergies as of 12/31/2018   No Known Allergies     Medication List    TAKE these medications   allopurinol 100 MG tablet Commonly known as:  ZYLOPRIM Take 100 mg by mouth daily.   aspirin EC 81 MG tablet Take 81 mg by mouth daily.   atorvastatin 20 MG tablet Commonly known as:  LIPITOR Take 1 tablet by mouth at bedtime.   carvedilol 12.5 MG tablet Commonly known as:  COREG Take 1 tablet by mouth 2 (two) times daily.   cholecalciferol 25 MCG (1000 UT) tablet Commonly known as:  VITAMIN D3 Take 2,000 Units by mouth daily.  diclofenac 75 MG EC tablet Commonly known as:  VOLTAREN Take 1 tablet by mouth 2 (two) times daily as needed for pain.   gabapentin 100 MG capsule Commonly known as:  NEURONTIN Take 100 mg by mouth 3 (three) times daily.   glipiZIDE 10 MG 24 hr tablet Commonly known as:  GLUCOTROL XL Take 1 tablet by mouth 2 (two) times daily.   hydrochlorothiazide 25 MG tablet Commonly known as:  HYDRODIURIL Take 25 mg by mouth daily.   HYDROcodone-acetaminophen 5-325 MG tablet Commonly known as:  NORCO/VICODIN Take 1-2 tablets by mouth every 6 (six) hours as needed for moderate pain.   metFORMIN 500 MG 24 hr tablet Commonly known as:  GLUCOPHAGE-XR Take 1,000 mg by mouth 2 (two) times daily.   methocarbamol 500 MG tablet Commonly known as:  ROBAXIN Take 1 tablet (500 mg total) by mouth 4 (four) times daily.   multivitamin with minerals Tabs tablet Take 1 tablet by mouth daily.   OS-CAL CALCIUM + D3 500-200 MG-UNIT Tabs Generic drug:  Calcium Carb-Cholecalciferol Take 1 tablet by mouth every morning.   VICTOZA 18 MG/3ML Sopn Generic drug:   liraglutide Inject 1.2 mg into the skin every morning.   vitamin B-12 1000 MCG tablet Commonly known as:  CYANOCOBALAMIN Take 1,000 mcg by mouth daily.       Disposition: SNF   Final Dx: posterior decompression and fusion C3-C6  Discharge Instructions    Call MD for:  difficulty breathing, headache or visual disturbances   Complete by:  As directed    Call MD for:  hives   Complete by:  As directed    Call MD for:  persistant dizziness or light-headedness   Complete by:  As directed    Call MD for:  persistant nausea and vomiting   Complete by:  As directed    Call MD for:  redness, tenderness, or signs of infection (pain, swelling, redness, odor or green/yellow discharge around incision site)   Complete by:  As directed    Call MD for:  severe uncontrolled pain   Complete by:  As directed    Call MD for:  temperature >100.4   Complete by:  As directed    Diet - low sodium heart healthy   Complete by:  As directed    Driving Restrictions   Complete by:  As directed    No driving   Increase activity slowly   Complete by:  As directed    Remove dressing in 24 hours   Complete by:  As directed       Contact information for after-discharge care    Destination    [facility] .   Service:  Skilled Nursing Contact information: [address] [place] [address] [phone]               Signed: Ocie Cornfield Marlynn Hinckley 12/31/2018, 10:54 AM

## 2018-12-31 NOTE — Social Work (Signed)
CSW received call from SNF, authorization has been received for pt.   CSW continuing to follow for support with disposition when medically appropriate.  Westley Hummer, MSW, [facility] [phone]

## 2018-12-31 NOTE — Progress Notes (Signed)
PROGRESS NOTE  Ralph Griffin QVZ:[id number] DOB: [birth year] DOA: 12/27/2018 PCP: Raina Mina., MD  Brief History   Patient with history of type 2 diabetes, hypertension, restless leg syndrome who has been recently suffering from stenosis of the cervical spine, he had posterior instrumentation in November 2019 and was at rehab.  Patient had progressive weakness of all extremities and was seen at [facility] ER and then transferred to neurosurgery at [facility].  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  He has multiple medical problems for their fairly controlled. Today on examination, patient was very optimistic about regaining his strength on his right arm, he also has regained his strength on his left arm.  His legs are still weak, however patient stated they are better.  Only complaint today is he has restless leg syndrome and "legs dance all night".  Blood sugars were 60 in the morning, however he had no symptoms.  The patient has been transferred to a step down bed. The hospitalist service has been consulted to manage the patient's medical issues.   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  Postsurgical management as per surgery.  Patient is on hard collar.  Continue PT OT.  Patient will benefit with inpatient therapies and recommended skilled nursing facility.  Currently pain well managed on oral narcotics.  Type 2 diabetes: Patient was on glipizide and metformin at home.  He is on sliding scale insulin here.  His blood sugar was 60 in the morning, now blood sugar has stabilized.  He will eat regular diet and continue glipizide metformin. FSBS has been 174 - 214 in the last 2 days.  Hypertension: Very well controlled on current regimen.  Restless leg syndrome: Patient has significant symptoms at night.  He benefited with Requip in the past.  Will start on  low-dose of Requip 4 mg at night.  Paroxysmal A. fib: Rate controlled.  No acute symptoms.  Patient is stable on current medical management. Ordered chemistry and CBC for morning. Chest x-ray was reviewed, essentially normal.  we will followup again tomorrow. Please contact me if I can be of assistance in the meanwhile. Thank you for this consultation.  DVT prophylaxis: SCD's Code Status: Full Code Family Communication: None at bedside Disposition Plan: Rehab   [name], DO [facility] Direct contact: see [email or web address]  7PM-7AM contact night coverage as above 12/31/2018, 2:23 PM  LOS: 3 days   Consultants  . Neurosurgery  Procedures  . C-6 posterior ACDF  Interval History/Subjective  The patient is sitting up in bed about to work with physical therapy.   Objective   Vitals:  Vitals:   12/31/18 0823 12/31/18 1135  BP: 121/64 118/65  Pulse: (!) 58 81  Resp:  16  Temp: 98.2 F (36.8 C) 98.1 F (36.7 C)  SpO2: 93% 96%    Exam:  Constitutional:  . Appears calm and comfortable Neck:  . Unable to examine neck as it in a C collar. . no thyromegaly Respiratory:  . No wheezes, rales, or rhonchi.  . No tactile fremitus. . No increased work of breathing. No wheezes, rales, or rhonchi.. Cardiovascular:  . Regular rate or rhonchi. No murmurs, ectopy, or gallups. . No LE extremity edema   . Normal pedal pulses Abdomen:  . Abdomen is soft, non-tender, and non-distended. . No hernias, masses, or organomegaly. . Normoactive bowel sounds Musculoskeletal:  . No cyanosis, clubbing or edema.  Skin:  . No rashes, lesions, ulcers . palpation of skin: no induration or nodules Neurologic:  . CN 2-12 intact . Sensation all 4 extremities intact Psychiatric:  . Mental status o Mood, affect appropriate o Orientation to person, place, time  . judgment and insight appear intact     I have personally reviewed the following:   Today's Data  . CB, chemistry,  vitals, cultures.  Scheduled Meds: . allopurinol  100 mg Oral Daily  . aspirin EC  81 mg Oral Daily  . atorvastatin  20 mg Oral QHS  . calcium-vitamin D  1 tablet Oral Daily  . carvedilol  12.5 mg Oral BID WC  . cholecalciferol  2,000 Units Oral Daily  . gabapentin  100 mg Oral TID  . glipiZIDE  10 mg Oral BID WC  . hydrochlorothiazide  25 mg Oral Daily  . insulin aspart  0-5 Units Subcutaneous QHS  . insulin aspart  0-9 Units Subcutaneous TID WC  . insulin aspart  4 Units Subcutaneous TID WC  . liraglutide  1.2 mg Subcutaneous BH-q7a  . metFORMIN  1,000 mg Oral BID WC  . multivitamin with minerals  1 tablet Oral Daily  . pantoprazole  40 mg Oral QHS  . rOPINIRole  4 mg Oral QHS  . sodium chloride flush  3 mL Intravenous Q12H  . vitamin B-12  1,000 mcg Oral Daily   Continuous Infusions: . sodium chloride Stopped (12/28/18 0539)    Active Problems:   Cervical stenosis of spine   Myelopathy (HCC)   LOS: 3 days

## 2018-12-31 NOTE — Clinical Social Work Placement (Signed)
   CLINICAL SOCIAL WORK PLACEMENT  NOTE [facility] Dayna Ramus RN to call report to [phone]  Date:  12/31/2018  Patient Details  Name: Ralph Griffin MRN: [id number] Date of Birth: 03-19-1941  Clinical Social Work is seeking post-discharge placement for this patient at the [place] level of care (*CSW will initial, date and re-position this form in  chart as items are completed):  Yes   Patient/family provided with [facility] Department's list of facilities offering this level of care within the geographic area requested by the patient (or if unable, by the patient's family).  Yes   Patient/family informed of their freedom to choose among providers that offer the needed level of care, that participate in Medicare, Medicaid or managed care program needed by the patient, have an available bed and are willing to accept the patient.  Yes   Patient/family informed of [place]'s ownership interest in [facility] and [facility], as well as of the fact that they are under no obligation to receive care at these facilities.  PASRR submitted to EDS on       PASRR number received on 12/28/18     Existing PASRR number confirmed on       FL2 transmitted to all facilities in geographic area requested by pt/family on 12/28/18     FL2 transmitted to all facilities within larger geographic area on       Patient informed that his/her managed care company has contracts with or will negotiate with certain facilities, including the following:        Yes   Patient/family informed of bed offers received.  Patient chooses bed at [facility] and [facility] recommends and patient chooses bed at      Patient to be transferred to [facility] on 12/31/18.  Patient to be transferred to facility by [facility]     Patient family notified on 12/31/18 of transfer.  Name of family member notified:  pt daughter Ralph Griffin via  telephone     PHYSICIAN Please prepare prescriptions     Additional Comment:    _______________________________________________ Alexander Mt, [place] 12/31/2018, 11:07 AM

## 2018-12-31 NOTE — Progress Notes (Signed)
Report called to Luellen Pucker, Therapist, sports at [facility] at [phone].  Patient going to room 216.

## 2018-12-31 NOTE — Progress Notes (Signed)
VAST RN consulted for PIV placement. VAST RN to unit and spoke with pt's nurse, Melissa. Educated we are promoting vein preservation and infection prevention when a pt does not have a current need for IV placement. Pt currently not receiving IV fluids or meds. His pain has been well controlled with PO meds. Melissa, RN verbalized understanding. VAST RN advised if pt needs access at a later time, another consult should be placed.

## 2018-12-31 NOTE — Care Management Note (Signed)
Case Management Note  Patient Details  Name: Ralph Griffin MRN: [id number] Date of Birth: August 24, 1941  Subjective/Objective:   78 yo admitted for C-6 posterior ACDF after fall in january with progressive quadraparesis.  PTA, pt needs assistance with ADLS; he was residing at [facility].                  Action/Plan: PT/OT recommending SNF; CSW following to facilitate dc to SNF upon medical stability.    Expected Discharge Date:  12/31/18               Expected Discharge Plan:  Skilled Nursing Facility  In-House Referral:  Clinical Social Work  Discharge planning Services  CM Consult  Post Acute Care Choice:    Choice offered to:     DME Arranged:    DME Agency:     HH Arranged:    [place] Agency:     Status of Service:  Completed, signed off  If discussed at [employer], dates discussed:    Additional Comments:  12/31/18 J. Stacy Deshler, RN, BSN Pt medically stable for discharge today; plan dc to SNF, per CSW arrangements.    Reinaldo Raddle, RN, BSN  Trauma/Neuro ICU Case Manager [phone]

## 2018-12-31 NOTE — Progress Notes (Signed)
Occupational Therapy Treatment Patient Details Name: Ralph Griffin MRN: [id number] DOB: 1941/06/06 Today's Date: 12/31/2018    History of present illness 78 yo admitted for C-6 posterior ACDF after fall in january with progressive quadraparesis. PMhx: C3-6 ACDF, DM, HTN   OT comments  Pt continues to make progress toward goals. Focus of session on BUE strengtheing and use of visual feedback to compensate for sensory deficits during movement patterns. Pt educated on  additional strengthening ex for intrinsics. Using foam and compensatory techniques for self - feeding. Phone adapted with dycen to help with using phone independently. Pt very appreciative. Will continue to follow acutely.   Follow Up Recommendations  SNF;Supervision/Assistance - 24 hour    Equipment Recommendations  Other (comment)    Recommendations for Other Services      Precautions / Restrictions Precautions Precautions: Cervical;Fall Restrictions Weight Bearing Restrictions: No                                                     ADL either performed or assessed with clinical judgement   ADL Overall ADL's : Needs assistance/impaired Eating/Feeding: Moderate assistance Eating/Feeding Details (indicate cue type and reason): working on hand to mouth; using B hands for cup; emphasis on controlled movement patterns due to poor sensory feedback; would benefit from adapting cup with dycem                                   General ADL Comments: telephone adapted with "dycem" to improve ability to use phone independently     Vision       Perception     Praxis      Cognition Arousal/Alertness: Awake/alert Behavior During Therapy: WFL for tasks assessed/performed Overall Cognitive Status: Within Functional Limits for tasks assessed                                          Exercises General Exercises - Upper Extremity Shoulder Flexion: AROM;AAROM;20  reps;Seated;Both;Strengthening Shoulder Extension: AROM;AAROM;20 reps;Seated;Both;Strengthening Shoulder ABduction: AROM;AAROM;Seated;20 reps;Both;Strengthening Elbow Flexion: AROM;Strengthening;20 reps;Seated Elbow Extension: AROM;Strengthening;Both;20 reps;Seated Wrist Flexion: AROM;10 reps;Strengthening;Seated;Both Wrist Extension: AROM;Strengthening;20 reps;Both Digit Composite Flexion: AROM;Strengthening;Both;20 reps(putty) Composite Extension: AROM;Strengthening;Both;15 reps;Seated(putty)   Shoulder Instructions       General Comments      Pertinent Vitals/ Pain       Pain Assessment: Faces Faces Pain Scale: Hurts little more Pain Location: posterior neck in sitting Pain Descriptors / Indicators: Grimacing;Guarding Pain Intervention(s): Limited activity within patient's tolerance  Home Living                                          Prior Functioning/Environment              Frequency  Min 2X/week        Progress Toward Goals  OT Goals(current goals can now be found in the care plan section)  Progress towards OT goals: Progressing toward goals  Acute Rehab OT Goals Patient Stated Goal: to get my independence back OT Goal Formulation: With patient Time For Goal Achievement: 01/11/19 Potential to  Achieve Goals: Good ADL Goals Pt Will Perform Eating: with min assist;with adaptive utensils;sitting Pt Will Perform Grooming: with min assist;sitting Pt Will Perform Upper Body Bathing: with min assist;sitting Pt/caregiver will Perform Home Exercise Program: Increased ROM;Increased strength;With written HEP provided;Both right and left upper extremity Additional ADL Goal #1: Pt will sustain sitting EOB with mod assist for 10 minutes to increase participation in ADLs.  Plan Discharge plan remains appropriate    Co-evaluation                 AM-PAC OT "6 Clicks" Daily Activity     Outcome Measure   Help from another person eating  meals?: A Lot Help from another person taking care of personal grooming?: A Lot Help from another person toileting, which includes using toliet, bedpan, or urinal?: Total Help from another person bathing (including washing, rinsing, drying)?: A Lot Help from another person to put on and taking off regular upper body clothing?: A Lot Help from another person to put on and taking off regular lower body clothing?: Total 6 Click Score: 10    End of Session Equipment Utilized During Treatment: Cervical collar  OT Visit Diagnosis: Other abnormalities of gait and mobility (R26.89);Muscle weakness (generalized) (M62.81);Other symptoms and signs involving the nervous system (R29.898)   Activity Tolerance Patient tolerated treatment well   Patient Left in bed;with call bell/phone within reach;Other (comment)(chair position)   Nurse Communication Mobility status;Other (comment)(need for AE)        Time: 1010-1040 OT Time Calculation (min): 30 min  Charges: OT General Charges $OT Visit: 1 Visit OT Treatments $Self Care/Home Management : 8-22 mins $Therapeutic Exercise: 8-22 mins  Maurie Boettcher, OT/L   Acute OT Clinical Specialist Acute Rehabilitation Services Pager [phone] Office [phone]    [facility] 12/31/2018, 11:26 AM

## 2019-03-23 DIAGNOSIS — Z6825 Body mass index (BMI) 25.0-25.9, adult: Secondary | ICD-10-CM | POA: Insufficient documentation

## 2019-03-23 DIAGNOSIS — E6609 Other obesity due to excess calories: Secondary | ICD-10-CM | POA: Insufficient documentation

## 2019-03-23 DIAGNOSIS — D638 Anemia in other chronic diseases classified elsewhere: Secondary | ICD-10-CM | POA: Insufficient documentation

## 2019-06-02 ENCOUNTER — Observation Stay (HOSPITAL_COMMUNITY)
Admission: EM | Admit: 2019-06-02 | Discharge: 2019-06-03 | Disposition: A | Discharge status: Home / Self Care | Payer: Medicare HMO | Attending: Internal Medicine | Admitting: Internal Medicine

## 2019-06-02 ENCOUNTER — Emergency Department (HOSPITAL_COMMUNITY): Payer: Medicare HMO

## 2019-06-02 ENCOUNTER — Inpatient Hospital Stay (HOSPITAL_COMMUNITY): Payer: Medicare HMO

## 2019-06-02 ENCOUNTER — Other Ambulatory Visit: Payer: Self-pay

## 2019-06-02 ENCOUNTER — Encounter (HOSPITAL_COMMUNITY): Payer: Self-pay | Admitting: Emergency Medicine

## 2019-06-02 DIAGNOSIS — Z6833 Body mass index (BMI) 33.0-33.9, adult: Secondary | ICD-10-CM | POA: Diagnosis not present

## 2019-06-02 DIAGNOSIS — R7989 Other specified abnormal findings of blood chemistry: Secondary | ICD-10-CM | POA: Insufficient documentation

## 2019-06-02 DIAGNOSIS — E669 Obesity, unspecified: Secondary | ICD-10-CM | POA: Diagnosis not present

## 2019-06-02 DIAGNOSIS — Z79899 Other long term (current) drug therapy: Secondary | ICD-10-CM | POA: Diagnosis not present

## 2019-06-02 DIAGNOSIS — E119 Type 2 diabetes mellitus without complications: Secondary | ICD-10-CM

## 2019-06-02 DIAGNOSIS — D72829 Elevated white blood cell count, unspecified: Secondary | ICD-10-CM | POA: Diagnosis present

## 2019-06-02 DIAGNOSIS — E11649 Type 2 diabetes mellitus with hypoglycemia without coma: Secondary | ICD-10-CM | POA: Diagnosis not present

## 2019-06-02 DIAGNOSIS — R2681 Unsteadiness on feet: Secondary | ICD-10-CM | POA: Insufficient documentation

## 2019-06-02 DIAGNOSIS — Z7982 Long term (current) use of aspirin: Secondary | ICD-10-CM | POA: Insufficient documentation

## 2019-06-02 DIAGNOSIS — Z791 Long term (current) use of non-steroidal anti-inflammatories (NSAID): Secondary | ICD-10-CM | POA: Insufficient documentation

## 2019-06-02 DIAGNOSIS — N390 Urinary tract infection, site not specified: Secondary | ICD-10-CM

## 2019-06-02 DIAGNOSIS — E162 Hypoglycemia, unspecified: Secondary | ICD-10-CM | POA: Diagnosis not present

## 2019-06-02 DIAGNOSIS — E869 Volume depletion, unspecified: Secondary | ICD-10-CM | POA: Insufficient documentation

## 2019-06-02 DIAGNOSIS — I10 Essential (primary) hypertension: Secondary | ICD-10-CM | POA: Insufficient documentation

## 2019-06-02 DIAGNOSIS — Z9889 Other specified postprocedural states: Secondary | ICD-10-CM | POA: Insufficient documentation

## 2019-06-02 DIAGNOSIS — N2889 Other specified disorders of kidney and ureter: Secondary | ICD-10-CM | POA: Diagnosis not present

## 2019-06-02 DIAGNOSIS — M6281 Muscle weakness (generalized): Secondary | ICD-10-CM | POA: Insufficient documentation

## 2019-06-02 DIAGNOSIS — N179 Acute kidney failure, unspecified: Secondary | ICD-10-CM | POA: Insufficient documentation

## 2019-06-02 DIAGNOSIS — N3001 Acute cystitis with hematuria: Secondary | ICD-10-CM

## 2019-06-02 DIAGNOSIS — Z1159 Encounter for screening for other viral diseases: Secondary | ICD-10-CM | POA: Diagnosis not present

## 2019-06-02 DIAGNOSIS — M4802 Spinal stenosis, cervical region: Secondary | ICD-10-CM | POA: Insufficient documentation

## 2019-06-02 DIAGNOSIS — Z7984 Long term (current) use of oral hypoglycemic drugs: Secondary | ICD-10-CM | POA: Diagnosis not present

## 2019-06-02 DIAGNOSIS — E1142 Type 2 diabetes mellitus with diabetic polyneuropathy: Secondary | ICD-10-CM

## 2019-06-02 LAB — CBC
HCT: 33.4 % — ABNORMAL LOW (ref 39.0–52.0)
Hemoglobin: 10.7 g/dL — ABNORMAL LOW (ref 13.0–17.0)
MCH: 30.1 pg (ref 26.0–34.0)
MCHC: 32 g/dL (ref 30.0–36.0)
MCV: 94.1 fL (ref 80.0–100.0)
Platelets: 233 10*3/uL (ref 150–400)
RBC: 3.55 MIL/uL — ABNORMAL LOW (ref 4.22–5.81)
RDW: 14.2 % (ref 11.5–15.5)
WBC: 18.8 10*3/uL — ABNORMAL HIGH (ref 4.0–10.5)
nRBC: 0 % (ref 0.0–0.2)

## 2019-06-02 LAB — HEPATIC FUNCTION PANEL
ALT: 20 U/L (ref 0–44)
AST: 27 U/L (ref 15–41)
Albumin: 2.6 g/dL — ABNORMAL LOW (ref 3.5–5.0)
Alkaline Phosphatase: 71 U/L (ref 38–126)
Bilirubin, Direct: 0.2 mg/dL (ref 0.0–0.2)
Indirect Bilirubin: 0.4 mg/dL (ref 0.3–0.9)
Total Bilirubin: 0.6 mg/dL (ref 0.3–1.2)
Total Protein: 6 g/dL — ABNORMAL LOW (ref 6.5–8.1)

## 2019-06-02 LAB — BASIC METABOLIC PANEL
Anion gap: 11 (ref 5–15)
BUN: 31 mg/dL — ABNORMAL HIGH (ref 8–23)
CO2: 22 mmol/L (ref 22–32)
Calcium: 8.6 mg/dL — ABNORMAL LOW (ref 8.9–10.3)
Chloride: 101 mmol/L (ref 98–111)
Creatinine, Ser: 1.46 mg/dL — ABNORMAL HIGH (ref 0.61–1.24)
GFR calc Af Amer: 53 mL/min — ABNORMAL LOW (ref >60)
GFR calc non Af Amer: 46 mL/min — ABNORMAL LOW (ref >60)
Glucose, Bld: 115 mg/dL — ABNORMAL HIGH (ref 70–99)
Potassium: 4 mmol/L (ref 3.5–5.1)
Sodium: 134 mmol/L — ABNORMAL LOW (ref 135–145)

## 2019-06-02 LAB — URINALYSIS, ROUTINE W REFLEX MICROSCOPIC
Bilirubin Urine: NEGATIVE
Glucose, UA: NEGATIVE mg/dL
Hgb urine dipstick: NEGATIVE
Ketones, ur: NEGATIVE mg/dL
Nitrite: NEGATIVE
Protein, ur: NEGATIVE mg/dL
Specific Gravity, Urine: 1.015 (ref 1.005–1.030)
pH: 5 (ref 5.0–8.0)

## 2019-06-02 LAB — CBG MONITORING, ED
Glucose-Capillary: 86 mg/dL (ref 70–99)
Glucose-Capillary: 42 mg/dL — CL (ref 70–99)

## 2019-06-02 LAB — GLUCOSE, CAPILLARY: Glucose-Capillary: 74 mg/dL (ref 70–99)

## 2019-06-02 LAB — TROPONIN I (HIGH SENSITIVITY)
Troponin I (High Sensitivity): 50 ng/L — ABNORMAL HIGH (ref <18)
Troponin I (High Sensitivity): 33 ng/L — ABNORMAL HIGH (ref <18)
Troponin I (High Sensitivity): 32 ng/L — ABNORMAL HIGH (ref <18)

## 2019-06-02 LAB — SARS CORONAVIRUS 2 BY RT PCR (HOSPITAL ORDER, PERFORMED IN ~~LOC~~ HOSPITAL LAB): SARS Coronavirus 2: NEGATIVE

## 2019-06-02 MED ORDER — CARVEDILOL 12.5 MG PO TABS
12.5000 mg | ORAL_TABLET | Freq: Two times a day (BID) | ORAL | Status: DC
  Administered 2019-06-02 – 2019-06-03 (×2): 12.5 mg via ORAL
  Filled 2019-06-02 (×2): qty 1

## 2019-06-02 MED ORDER — CALCIUM CARBONATE-VITAMIN D 500-200 MG-UNIT PO TABS
1.0000 | ORAL_TABLET | ORAL | Status: DC
  Administered 2019-06-03: 1 via ORAL
  Filled 2019-06-02: qty 1

## 2019-06-02 MED ORDER — GABAPENTIN 100 MG PO CAPS
100.0000 mg | ORAL_CAPSULE | Freq: Three times a day (TID) | ORAL | Status: DC
  Administered 2019-06-02 – 2019-06-03 (×2): 100 mg via ORAL
  Filled 2019-06-02 (×2): qty 1

## 2019-06-02 MED ORDER — ALLOPURINOL 100 MG PO TABS
100.0000 mg | ORAL_TABLET | Freq: Every day | ORAL | Status: DC
  Administered 2019-06-03: 100 mg via ORAL
  Filled 2019-06-02: qty 1

## 2019-06-02 MED ORDER — ADULT MULTIVITAMIN W/MINERALS CH
1.0000 | ORAL_TABLET | Freq: Every day | ORAL | Status: DC
  Administered 2019-06-02 – 2019-06-03 (×2): 1 via ORAL
  Filled 2019-06-02 (×2): qty 1

## 2019-06-02 MED ORDER — DEXTROSE-NACL 5-0.9 % IV SOLN
INTRAVENOUS | Status: DC
  Administered 2019-06-02: via INTRAVENOUS

## 2019-06-02 MED ORDER — SODIUM CHLORIDE 0.9% FLUSH
3.0000 mL | Freq: Once | INTRAVENOUS | Status: AC
  Administered 2019-06-02: 3 mL via INTRAVENOUS

## 2019-06-02 MED ORDER — SODIUM CHLORIDE 0.9 % IV SOLN
1.0000 g | Freq: Once | INTRAVENOUS | Status: AC
  Administered 2019-06-02: 1 g via INTRAVENOUS
  Filled 2019-06-02: qty 10

## 2019-06-02 MED ORDER — SODIUM CHLORIDE 0.9 % IV SOLN: 1.0000 g | INTRAVENOUS | Status: DC

## 2019-06-02 MED ORDER — VITAMIN D 25 MCG (1000 UNIT) PO TABS
2000.0000 [IU] | ORAL_TABLET | Freq: Every day | ORAL | Status: DC
  Administered 2019-06-03: 2000 [IU] via ORAL
  Filled 2019-06-02: qty 2

## 2019-06-02 MED ORDER — SODIUM CHLORIDE 0.9 % IV BOLUS
1000.0000 mL | Freq: Once | INTRAVENOUS | Status: AC
  Administered 2019-06-02: 14:00:00 1000 mL via INTRAVENOUS

## 2019-06-02 MED ORDER — ENOXAPARIN SODIUM 40 MG/0.4ML ~~LOC~~ SOLN: 40.0000 mg | SUBCUTANEOUS | Status: DC

## 2019-06-02 MED ORDER — ASPIRIN EC 81 MG PO TBEC
81.0000 mg | DELAYED_RELEASE_TABLET | Freq: Every day | ORAL | Status: DC
  Administered 2019-06-02 – 2019-06-03 (×2): 81 mg via ORAL
  Filled 2019-06-02 (×2): qty 1

## 2019-06-02 MED ORDER — VITAMIN B-12 1000 MCG PO TABS
1000.0000 ug | ORAL_TABLET | Freq: Every day | ORAL | Status: DC
  Administered 2019-06-03: 1000 ug via ORAL
  Filled 2019-06-02: qty 1

## 2019-06-02 MED ORDER — INSULIN ASPART 100 UNIT/ML ~~LOC~~ SOLN: 0.0000 [IU] | Freq: Three times a day (TID) | SUBCUTANEOUS | Status: DC

## 2019-06-02 MED ORDER — ROPINIROLE HCL 1 MG PO TABS
1.0000 mg | ORAL_TABLET | Freq: Every day | ORAL | Status: DC
  Administered 2019-06-03: 1 mg via ORAL
  Filled 2019-06-02 (×2): qty 1

## 2019-06-02 MED ORDER — SODIUM CHLORIDE 0.9 % IV SOLN
INTRAVENOUS | Status: DC
  Administered 2019-06-02: 23:00:00 via INTRAVENOUS

## 2019-06-02 NOTE — ED Notes (Signed)
Patient transported to MRI 

## 2019-06-02 NOTE — ED Provider Notes (Signed)
[facility] EMERGENCY DEPARTMENT Provider Note   CSN: [id number] Arrival date & time: 06/02/19  1052     History   Chief Complaint Chief Complaint  Patient presents with  . Hypoglycemia  . Chest Pain    HPI Chidera Thivierge Sr. is a 78 y.o. male.      HPI   78 year old male with multiple complaints.  Patient states that 2 days ago he was about to take a drink.  As he tilted his head backwards/extend his neck he suddenly had uncontrollable shaking "from my waist up."  He states that his arms trunk and head were shaking uncontrollably.  This lasts approximately 2 to 3 hours before subsiding.  He is also incontinent of urine.  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  Ports that he has been progressively been improving since that time but still has some persistent numbness in bilateral hands and some generalized weakness most pronounced in his right lower extremity.  He feels like neurologically is back to his baseline.  He has numerous other complaints as well.  Per his family he has been getting up frequently to use the bathroom.  Patient does acknowledge this.  He denies any dysuria.  Family at bedside also reports that he told them about some chest discomfort although he denies this to me.  He states that he feels generally weak/fatigued.  Past Medical History:  Diagnosis Date  . Diabetes mellitus without complication ([place])   . Hypertension     Patient Active Problem List   Diagnosis Date Noted  . Cervical stenosis of spine 12/28/2018  . Myelopathy ([place]) 12/28/2018    Past Surgical History:  Procedure Laterality Date  . CERVICAL DISCECTOMY    . POSTERIOR CERVICAL LAMINECTOMY N/A 12/27/2018   Procedure: Decompressive Cervical Laminectomy and Posterior Cervical Fusion Cervical three-four, four-five, five-six;  Surgeon: Kary Kos, MD;  Location: [facility];   Service: Neurosurgery;  Laterality: N/A;        Home Medications    Prior to Admission medications   Medication Sig Start Date End Date Taking? Authorizing Provider  rOPINIRole (REQUIP) 1 MG tablet Take 1 mg by mouth every evening. 04/28/19  Yes [provider]  allopurinol (ZYLOPRIM) 100 MG tablet Take 100 mg by mouth daily. 11/01/18   [provider]  aspirin EC 81 MG tablet Take 81 mg by mouth daily.    [provider]  atorvastatin (LIPITOR) 20 MG tablet Take 1 tablet by mouth at bedtime. 10/19/18   [provider]  carvedilol (COREG) 12.5 MG tablet Take 1 tablet by mouth 2 (two) times daily. 11/01/18   [provider]  cholecalciferol (VITAMIN D3) 25 MCG (1000 UT) tablet Take 2,000 Units by mouth daily.    [provider]  clotrimazole-betamethasone (LOTRISONE) cream Apply 1 application topically daily. 04/25/19   [provider]  diclofenac (VOLTAREN) 75 MG EC tablet Take 1 tablet by mouth 2 (two) times daily as needed for pain. 11/30/18   [provider]  gabapentin (NEURONTIN) 100 MG capsule Take 100 mg by mouth 3 (three) times daily.    [provider]  glipiZIDE (GLUCOTROL XL) 5 MG 24 hr tablet Take 5 mg by mouth 2 (two) times daily.  11/01/18   [provider]  hydrochlorothiazide (HYDRODIURIL) 25 MG tablet Take 25 mg by mouth daily. 11/01/18   [provider]  HYDROcodone-acetaminophen (NORCO/VICODIN) 5-325  MG tablet Take 1-2 tablets by mouth every 6 (six) hours as needed for moderate pain. 12/31/18 12/31/19  Meyran, Ocie Cornfield, NP  metFORMIN (GLUCOPHAGE-XR) 500 MG 24 hr tablet Take 1,000 mg by mouth 2 (two) times daily. 11/01/18   [provider]  methocarbamol (ROBAXIN) 500 MG tablet Take 1 tablet (500 mg total) by mouth 4 (four) times daily. 12/31/18   Meyran, Ocie Cornfield, NP  Multiple Vitamin (MULTIVITAMIN WITH MINERALS) TABS tablet Take 1 tablet by mouth daily.     [provider]  OS-CAL CALCIUM + D3 500-200 MG-UNIT TABS Take 1 tablet by mouth every morning. 09/24/18   [provider]  VICTOZA 18 MG/3ML SOPN Inject 1.2 mg into the skin every morning.  11/17/18   [provider]  vitamin B-12 (CYANOCOBALAMIN) 1000 MCG tablet Take 1,000 mcg by mouth daily.    [provider]    Family History History reviewed. No pertinent family history.  Social History Social History   Tobacco Use  . Smoking status: Never Smoker  . Smokeless tobacco: Never Used  Substance Use Topics  . Alcohol use: Not Currently  . Drug use: Not on file     Allergies   Patient has no known allergies.   Review of Systems Review of Systems  All systems reviewed and negative, other than as noted in HPI.  Physical Exam Updated Vital Signs BP (!) 112/49   Pulse 76   Temp 98 F (36.7 C) (Oral)   Resp 20   SpO2 96%   Physical Exam Vitals signs and nursing note reviewed.  Constitutional:      General: He is not in acute distress.    Appearance: He is well-developed.  HENT:     Head: Normocephalic and atraumatic.  Eyes:     General:        Right eye: No discharge.        Left eye: No discharge.     Conjunctiva/sclera: Conjunctivae normal.  Neck:     Musculoskeletal: Neck supple.  Cardiovascular:     Rate and Rhythm: Normal rate and regular rhythm.     Heart sounds: Normal heart sounds. No murmur. No friction rub. No gallop.   Pulmonary:     Effort: Pulmonary effort is normal. No respiratory distress.     Breath sounds: Normal breath sounds.  Abdominal:     General: There is no distension.     Palpations: Abdomen is soft.     Tenderness: There is no abdominal tenderness.  Musculoskeletal:        General: No tenderness.     Comments: Strength 4/5 RLE, 5/5 elsewhere.   Skin:    General: Skin is warm and dry.  Neurological:     Mental Status: He is alert.     Cranial Nerves: No cranial nerve deficit.  Psychiatric:         Behavior: Behavior normal.        Thought Content: Thought content normal.      ED Treatments / Results  Labs (all labs ordered are listed, but only abnormal results are displayed) Labs Reviewed  BASIC METABOLIC PANEL - Abnormal; Notable for the following components:      Result Value   Sodium 134 (*)    Glucose, Bld 115 (*)    BUN 31 (*)    Creatinine, Ser 1.46 (*)    Calcium 8.6 (*)    GFR calc non Af Amer 46 (*)    GFR calc Af  Amer 53 (*)    All other components within normal limits  CBC - Abnormal; Notable for the following components:   WBC 18.8 (*)    RBC 3.55 (*)    Hemoglobin 10.7 (*)    HCT 33.4 (*)    All other components within normal limits  URINALYSIS, ROUTINE W REFLEX MICROSCOPIC - Abnormal; Notable for the following components:   APPearance HAZY (*)    Leukocytes,Ua LARGE (*)    Bacteria, UA RARE (*)    All other components within normal limits  TROPONIN I (HIGH SENSITIVITY) - Abnormal; Notable for the following components:   Troponin I (High Sensitivity) 50 (*)    All other components within normal limits  TROPONIN I (HIGH SENSITIVITY) - Abnormal; Notable for the following components:   Troponin I (High Sensitivity) 33 (*)    All other components within normal limits  URINE CULTURE  SARS CORONAVIRUS 2 (HOSPITAL ORDER, [facility])  CBG MONITORING, ED    EKG EKG Interpretation  Date/Time:  Thursday June 02 2019 11:29:48 EDT Ventricular Rate:  83 PR Interval:    QRS Duration: 94 QT Interval:  390 QTC Calculation: 458 R Axis:   22 Text Interpretation:  Atrial fibrillation with premature ventricular or aberrantly conducted complexes Possible Inferior infarct , age undetermined Abnormal ECG No old tracing to compare Confirmed by Virgel Manifold [phone]) on 06/02/2019 11:52:30 AM   Radiology Mr Cervical Spine Wo Contrast  Result Date: 06/02/2019 CLINICAL DATA:  Radiculopathy. Cervical spine surgery in February 2020.  Previous anterior and posterior fusion from C3 through C6. EXAM: MRI CERVICAL SPINE WITHOUT CONTRAST TECHNIQUE: Multiplanar, multisequence MR imaging of the cervical spine was performed. No intravenous contrast was administered. COMPARISON:  Radiographs dated 12/28/2018 and MRI and CT scan dated 12/27/2018 FINDINGS: Alignment: Physiologic. Vertebrae: No fracture, evidence of discitis, or bone lesion. No appreciable facet arthritis. Cord: Normal signal and morphology. Posterior Fossa, vertebral arteries, paraspinal tissues: No significant abnormality. Postsurgical changes anteriorly and posteriorly to the expected degree. Disc levels: C2-3: Normal disc.  Slight right facet arthritis. C3-4: Anterior and posterior fusion.  No neural impingement. C4-5: Anterior and posterior fusion.  No neural impingement. C5-6: Anterior and posterior fusion. Tiny endplate osteophytes without neural impingement. C6-7: No disc bulging or protrusion. No neural impingement. Widely patent neural foramina. C7-T1: Tiny broad-based disc bulge with no neural impingement. Widely patent neural foramina. The visualized portion of the upper thoracic spine demonstrates no abnormality. IMPRESSION: 1. No acute abnormality of the cervical spine. 2. No spinal or foraminal stenosis. 3. No disc protrusions or focal neural impingement. 4. Anterior and posterior fusions from C3 through C6 with no visible complicating findings. Electronically Signed   By: Lorriane Shire M.D.   On: 06/02/2019 16:00    Procedures Procedures (including critical care time)  Medications Ordered in ED Medications  sodium chloride flush (NS) 0.9 % injection 3 mL (3 mLs Intravenous Given 06/02/19 1550)  sodium chloride 0.9 % bolus 1,000 mL ( Intravenous Restarted 06/02/19 1547)  cefTRIAXone (ROCEPHIN) 1 g in sodium chloride 0.9 % 100 mL IVPB (0 g Intravenous Stopped 06/02/19 1620)     Initial Impression / Assessment and Plan / ED Course  I have reviewed the triage vital  signs and the nursing notes.  Pertinent labs & imaging results that were available during my care of the patient were reviewed by me and considered in my medical decision making (see chart for details).  78 year old male with numerous complaints.  Neurologic symptoms 2 days ago have resolved and reports he is now back to his baseline.  Cervical decompression for central cord syndrome in February.  MRI today does not show any acute abnormalities.  Plus some degree of dehydration based on his labs.  Also appears that he also has a urinary tract infection.  Not sure what to make of his chest pain.  Patient denies it to me but family says he has been complaining of it.  Troponin is mildly elevated but repeat troponin is actually decreased from initial.  IV fluids ordered.  Rocephin for UTI.  Urine culture sent.  Will admit for observation for ongoing hydration and trending troponins.  Final Clinical Impressions(s) / ED Diagnoses   Final diagnoses:  AKI (acute kidney injury) ([place])  Acute cystitis with hematuria    ED Discharge Orders    None       Virgel Manifold, MD 06/03/19 [phone]

## 2019-06-02 NOTE — H&P (Signed)
History and Physical  [name] SHF:[id number] DOB: July 18, 1941 DOA: 06/02/2019  Referring physician: ER provider PCP: Raina Mina., MD  Outpatient Specialists:    Patient coming from: Home.  Chief Complaint: General feeling of unwell and low blood sugar.  HPI:  Patient is a 78 year old male with past medical history significant for hypertension and diabetes mellitus.  Patient also underwent posterior cervical laminectomy/cervical discectomy in February of this year.  Patient presents with repeated recordings of low blood sugar recently.  According to the patient, his blood sugar was in the 50s this morning.  Patient is on multiple antidiabetic medications.  Patient's problem may have started 2 days ago with shaking for about 3 hours.  Patient's blood sugar was noted to be low at the time.  No loss of consciousness was reported and patient was aware of this check.  No prior history of seizures.  There is vague report of chest pain as per collateral information.  High sensitive troponin on presentation was 50, but repeat troponin was 33.  Patient does not endorse any chest pain at the moment.  No fever or chills.  UA suggestive of possible UTI and volume depletion.  No headache, no neck pain, no shortness of breath, no nausea or vomiting, no other GI symptoms and no urinary symptoms reported.  Patient be admitted for further assessment and management.  ED Course: Temperature on presentation to the hospital was 98, heart rate of 74 to 101 bpm, respiratory rate of 13 to 24/min, blood pressure 125/56 mmHg and O2 sat of 96% on room air.  Lab work done is as documented below.  Urine cultures have been sent, and patient has received 1 dose of IV Rocephin.  Hospitalist team has been asked to admit patient for further assessment and management.  Pertinent labs: Blood sugar was 86 on presentation.  Chemistry revealed sodium of 134, potassium of 4, chloride 101, CO2 of 22, BUN of 31, serum creatinine  of 1.46 (up from 1) and blood sugar of 115.  First high sensitive troponin was 50 and a repeat troponin was 33.  CBC reveals WBC of 18.8, hemoglobin of 10.7, hematocrit of 33.4, MCV of 94.1 with platelet count of 233.  Urinalysis reveals large leukocytes esterase and specific gravity of 1.015.  Urine microscopy reveals rare bacteria and urine WBC of 11-20.  EKG: Independently reviewed.   Imaging: independently reviewed. CT of the head without contrast has not shown any acute findings.  MRI of the cervical spine revealed anterior and posterior fusions from C3-6 6 with no visible complicating findings.  Review of Systems:  Negative for fever, visual changes, sore throat, rash, new muscle aches, chest pain, SOB, dysuria, bleeding, n/v/abdominal pain.  Past Medical History:  Diagnosis Date  . Diabetes mellitus without complication ([place])   . Hypertension     Past Surgical History:  Procedure Laterality Date  . CERVICAL DISCECTOMY    . POSTERIOR CERVICAL LAMINECTOMY N/A 12/27/2018   Procedure: Decompressive Cervical Laminectomy and Posterior Cervical Fusion Cervical three-four, four-five, five-six;  Surgeon: Kary Kos, MD;  Location: [facility];  Service: Neurosurgery;  Laterality: N/A;     reports that he has never smoked. He has never used smokeless tobacco. He reports previous alcohol use. No history on file for drug.  No Known Allergies  History reviewed. No pertinent family history.   Prior to Admission medications   Medication Sig Start Date End Date Taking? Authorizing Provider  allopurinol (ZYLOPRIM) 100 MG tablet Take 100 mg  by mouth daily. 11/01/18  Yes [provider]  aspirin EC 81 MG tablet Take 81 mg by mouth daily.   Yes [provider]  atorvastatin (LIPITOR) 20 MG tablet Take 20 mg by mouth at bedtime.  10/19/18  Yes [provider]  carvedilol (COREG) 12.5 MG tablet Take 12.5 mg by mouth 2 (two) times daily.  11/01/18  Yes [provider]   gabapentin (NEURONTIN) 100 MG capsule Take 100 mg by mouth 3 (three) times daily.   Yes [provider]  glipiZIDE (GLUCOTROL) 5 MG tablet Take 10 mg by mouth 2 (two) times daily before a meal.   Yes [provider]  hydrochlorothiazide (HYDRODIURIL) 25 MG tablet Take 25 mg by mouth daily. 11/01/18  Yes [provider]  ibuprofen (ADVIL) 200 MG tablet Take 400 mg by mouth every 6 (six) hours as needed (for pain or headaches).    Yes [provider]  metFORMIN (GLUCOPHAGE-XR) 500 MG 24 hr tablet Take 1,000 mg by mouth 2 (two) times daily. 11/01/18  Yes [provider]  Multiple Vitamin (MULTIVITAMIN WITH MINERALS) TABS tablet Take 1 tablet by mouth daily.   Yes [provider]  OS-CAL CALCIUM + D3 500-200 MG-UNIT TABS Take 1 tablet by mouth every morning. 09/24/18  Yes [provider]  rOPINIRole (REQUIP) 1 MG tablet Take 1 mg by mouth at bedtime.  04/28/19  Yes [provider]  VICTOZA 18 MG/3ML SOPN Inject 1.2 mg into the skin daily before breakfast.  11/17/18  Yes [provider]  vitamin B-12 (CYANOCOBALAMIN) 1000 MCG tablet Take 1,000 mcg by mouth daily.   Yes [provider]  cholecalciferol (VITAMIN D3) 25 MCG (1000 UT) tablet Take 2,000 Units by mouth daily.    [provider]  clotrimazole-betamethasone (LOTRISONE) cream Apply 1 application topically daily. 04/25/19   [provider]  diclofenac (VOLTAREN) 75 MG EC tablet Take 75 mg by mouth 2 (two) times daily as needed for pain.  11/30/18   [provider]  HYDROcodone-acetaminophen (NORCO/VICODIN) 5-325 MG tablet Take 1-2 tablets by mouth every 6 (six) hours as needed for moderate pain. Patient not taking: Reported on 06/02/2019 12/31/18 12/31/19  Eleonore Chiquito, NP  methocarbamol (ROBAXIN) 500 MG tablet Take 1 tablet (500 mg total) by mouth 4 (four) times daily. Patient not taking: Reported on 06/02/2019 12/31/18   Eleonore Chiquito, NP    Physical Exam: Vitals:   06/02/19 1627 06/02/19 1645 06/02/19 1915 06/02/19 1927  BP: (!) 112/49 (!) 119/53 (!) 114/54   Pulse: 76 85 87   Resp: 20 18 18    Temp:      TempSrc:      SpO2: 96% 96% 96%   Weight:    117.9 kg  Height:    6\' 3"  (1.905 m)    Constitutional:  . Appears calm and comfortable Eyes:  . No pallor. No jaundice.  ENMT:  . external ears, nose appear normal.  Dry buccal mucosa. Neck:  . Neck is supple. No JVD Respiratory:  . CTA bilaterally, no w/r/r.  . Respiratory effort normal. No retractions or accessory muscle use Cardiovascular:  . S1S2 . Edema of the ankles. Abdomen:  . Abdomen is obese, soft and non tender. Organs are difficult to assess. Neurologic:  . Awake and alert. . Moves all limbs.  Wt Readings from Last 3 Encounters:  06/02/19 117.9 kg    I have personally reviewed following labs and imaging studies  Labs on  Admission:  CBC: Recent Labs  Lab 06/02/19 1131  WBC 18.8*  HGB 10.7*  HCT 33.4*  MCV 94.1  PLT 322   Basic Metabolic Panel: Recent Labs  Lab 06/02/19 1131  NA 134*  K 4.0  CL 101  CO2 22  GLUCOSE 115*  BUN 31*  CREATININE 1.46*  CALCIUM 8.6*   Liver Function Tests: No results for input(s): AST, ALT, ALKPHOS, BILITOT, PROT, ALBUMIN in the last 168 hours. No results for input(s): LIPASE, AMYLASE in the last 168 hours. No results for input(s): AMMONIA in the last 168 hours. Coagulation Profile: No results for input(s): INR, PROTIME in the last 168 hours. Cardiac Enzymes: No results for input(s): CKTOTAL, CKMB, CKMBINDEX, TROPONINI in the last 168 hours. BNP (last 3 results) No results for input(s): PROBNP in the last 8760 hours. HbA1C: No results for input(s): HGBA1C in the last 72 hours. CBG: Recent Labs  Lab 06/02/19 1301  GLUCAP 86   Lipid Profile: No results for input(s): CHOL, HDL, LDLCALC, TRIG, CHOLHDL, LDLDIRECT in the last 72 hours. Thyroid Function Tests: No  results for input(s): TSH, T4TOTAL, FREET4, T3FREE, THYROIDAB in the last 72 hours. Anemia Panel: No results for input(s): VITAMINB12, FOLATE, FERRITIN, TIBC, IRON, RETICCTPCT in the last 72 hours. Urine analysis:    Component Value Date/Time   COLORURINE YELLOW 06/02/2019 1312   APPEARANCEUR HAZY (A) 06/02/2019 1312   LABSPEC 1.015 06/02/2019 1312   PHURINE 5.0 06/02/2019 1312   GLUCOSEU NEGATIVE 06/02/2019 1312   HGBUR NEGATIVE 06/02/2019 1312   BILIRUBINUR NEGATIVE 06/02/2019 1312   KETONESUR NEGATIVE 06/02/2019 1312   PROTEINUR NEGATIVE 06/02/2019 1312   NITRITE NEGATIVE 06/02/2019 1312   LEUKOCYTESUR LARGE (A) 06/02/2019 1312   Sepsis Labs: @LABRCNTIP (procalcitonin:4,lacticidven:4) )No results found for this or any previous visit (from the past 240 hour(s)).    Radiological Exams on Admission: Ct Head Wo Contrast  Result Date: 06/02/2019 CLINICAL DATA:  Neck surgery February 2020 with recent tremors. EXAM: CT HEAD WITHOUT CONTRAST TECHNIQUE: Contiguous axial images were obtained from the base of the skull through the vertex without intravenous contrast. COMPARISON:  12/11/2018 FINDINGS: Brain: Ventricles, cisterns and other CSF spaces are unchanged as there is minimal age related atrophic change. There is no mass, mass effect, shift of midline structures or acute hemorrhage. No evidence of acute infarction. Vascular: No hyperdense vessel or unexpected calcification. Skull: Normal. Negative for fracture or focal lesion. Sinuses/Orbits: No acute finding. Other: None. IMPRESSION: No acute findings. Mild age related atrophic change. Electronically Signed   By: Marin Olp M.D.   On: 06/02/2019 18:42   Mr Cervical Spine Wo Contrast  Result Date: 06/02/2019 CLINICAL DATA:  Radiculopathy. Cervical spine surgery in February 2020. Previous anterior and posterior fusion from C3 through C6. EXAM: MRI CERVICAL SPINE WITHOUT CONTRAST TECHNIQUE: Multiplanar, multisequence MR imaging of the  cervical spine was performed. No intravenous contrast was administered. COMPARISON:  Radiographs dated 12/28/2018 and MRI and CT scan dated 12/27/2018 FINDINGS: Alignment: Physiologic. Vertebrae: No fracture, evidence of discitis, or bone lesion. No appreciable facet arthritis. Cord: Normal signal and morphology. Posterior Fossa, vertebral arteries, paraspinal tissues: No significant abnormality. Postsurgical changes anteriorly and posteriorly to the expected degree. Disc levels: C2-3: Normal disc.  Slight right facet arthritis. C3-4: Anterior and posterior fusion.  No neural impingement. C4-5: Anterior and posterior fusion.  No neural impingement. C5-6: Anterior and posterior fusion. Tiny endplate osteophytes without neural impingement. C6-7: No disc bulging or protrusion. No neural impingement. Widely patent neural foramina. C7-T1:  Tiny broad-based disc bulge with no neural impingement. Widely patent neural foramina. The visualized portion of the upper thoracic spine demonstrates no abnormality. IMPRESSION: 1. No acute abnormality of the cervical spine. 2. No spinal or foraminal stenosis. 3. No disc protrusions or focal neural impingement. 4. Anterior and posterior fusions from C3 through C6 with no visible complicating findings. Electronically Signed   By: Lorriane Shire M.D.   On: 06/02/2019 16:00    EKG: Independently reviewed.   Active Problems:   * No active hospital problems. *   Assessment/Plan Hypoglycemia: Admit patient for further assessment and management. Patient is on several antidiabetic medications (metformin, glipizide and Victoza) Hold oral antidiabetic medications. Accu-Chek before meals at bedtime, with only sliding scale insulin coverage Check HbA1c Caloric count Continue to work-up for any secondary causes. Recent AKI may have contributed to the hypoglycemia.  Patient also has likely UTI.  Acute kidney injury: Patient was on diclofenac, ibuprofen, HCTZ prior to admission.  Patient may be volume depleted as well. Hold all nephrotoxic medications. Gentle hydration Renal ultrasound For further work-up if AKI does not improve.  Elevated troponin: Patient denies chest pain. Continue to cycle cardiac enzymes Low threshold to consult cardiology team in the morning.  Volume depletion: Hydrate patient (see above)  Likely UTI: Follow urine culture Continue IV Rocephin  Diabetes mellitus: See above. Blood sugar has been on the low side.  Leukocytosis: Likely multifactorial Continue to follow We will get chest x-ray for sake of completion.  Hypertension: Continue to optimize.  Further management will depend on hospital course.  DVT prophylaxis: Subcut Lovenox Code Status: Full code Family Communication:  Disposition Plan: Home eventually Consults called: None.  Have a low threshold to consult cardiology. Admission status: Inpatient  Time spent: 65 minutes  Dana Allan, MD  [facility] Pager #: [phone] 7PM-7AM contact night coverage as above  06/02/2019, 7:57 PM

## 2019-06-02 NOTE — ED Notes (Addendum)
Patient heart rhythm is in ventricular bigeminy. Printed a repeat EKG and gave to EDP. Patient is alert. He is denying chest pain.

## 2019-06-02 NOTE — ED Triage Notes (Signed)
Pt presents to ED with multiple complaints. Pt reports having neck surgery in February, pt reports Tuesday morning he tilted his head back and started having uncontrollable shaking for 3 hours. Pt also reports having episodes of incontinence and a strong odor from urine. Pt reports chest pain on Tuesday that radiated to his arms. Pt denies pain at this time. Pt reports his blood sugars have been in the 70s in the am.

## 2019-06-03 ENCOUNTER — Inpatient Hospital Stay (HOSPITAL_COMMUNITY): Payer: Medicare HMO

## 2019-06-03 DIAGNOSIS — N2889 Other specified disorders of kidney and ureter: Secondary | ICD-10-CM | POA: Diagnosis present

## 2019-06-03 DIAGNOSIS — E119 Type 2 diabetes mellitus without complications: Secondary | ICD-10-CM

## 2019-06-03 DIAGNOSIS — E162 Hypoglycemia, unspecified: Secondary | ICD-10-CM | POA: Diagnosis not present

## 2019-06-03 LAB — CBC
HCT: 31.9 % — ABNORMAL LOW (ref 39.0–52.0)
Hemoglobin: 10.2 g/dL — ABNORMAL LOW (ref 13.0–17.0)
MCH: 29.8 pg (ref 26.0–34.0)
MCHC: 32 g/dL (ref 30.0–36.0)
MCV: 93.3 fL (ref 80.0–100.0)
Platelets: 225 10*3/uL (ref 150–400)
RBC: 3.42 MIL/uL — ABNORMAL LOW (ref 4.22–5.81)
RDW: 13.7 % (ref 11.5–15.5)
WBC: 13.7 10*3/uL — ABNORMAL HIGH (ref 4.0–10.5)
nRBC: 0 % (ref 0.0–0.2)

## 2019-06-03 LAB — BASIC METABOLIC PANEL
Anion gap: 10 (ref 5–15)
BUN: 26 mg/dL — ABNORMAL HIGH (ref 8–23)
CO2: 22 mmol/L (ref 22–32)
Calcium: 8 mg/dL — ABNORMAL LOW (ref 8.9–10.3)
Chloride: 101 mmol/L (ref 98–111)
Creatinine, Ser: 1.22 mg/dL (ref 0.61–1.24)
GFR calc Af Amer: >60 mL/min (ref >60)
GFR calc non Af Amer: 57 mL/min — ABNORMAL LOW (ref >60)
Glucose, Bld: 158 mg/dL — ABNORMAL HIGH (ref 70–99)
Potassium: 3.6 mmol/L (ref 3.5–5.1)
Sodium: 133 mmol/L — ABNORMAL LOW (ref 135–145)

## 2019-06-03 LAB — PHOSPHORUS: Phosphorus: 3.3 mg/dL (ref 2.5–4.6)

## 2019-06-03 LAB — GLUCOSE, CAPILLARY
Glucose-Capillary: 155 mg/dL — ABNORMAL HIGH (ref 70–99)
Glucose-Capillary: 165 mg/dL — ABNORMAL HIGH (ref 70–99)
Glucose-Capillary: 169 mg/dL — ABNORMAL HIGH (ref 70–99)

## 2019-06-03 LAB — HEMOGLOBIN A1C
Hgb A1c MFr Bld: 7.8 % — ABNORMAL HIGH (ref 4.8–5.6)
Mean Plasma Glucose: 177.16 mg/dL

## 2019-06-03 LAB — TROPONIN I (HIGH SENSITIVITY): Troponin I (High Sensitivity): 34 ng/L — ABNORMAL HIGH (ref <18)

## 2019-06-03 LAB — MAGNESIUM: Magnesium: 1.6 mg/dL — ABNORMAL LOW (ref 1.7–2.4)

## 2019-06-03 LAB — URINE CULTURE: Culture: >=100000 — AB

## 2019-06-03 NOTE — Progress Notes (Signed)
Pt's CBG in ED was 42, OJ given and crackers and PB, rechecked when pt arrived to 3e12, CBG back to 74, MD aware, ordered D5 with his IV fluids and CBG q4hr, and hold insulin until CBG 250, will continue to monitor, Thanks Arvella Nigh RN.

## 2019-06-03 NOTE — Progress Notes (Signed)
Inpatient Diabetes Program Recommendations  AACE/ADA: New Consensus Statement on Inpatient Glycemic Control (2015)  Target Ranges:  Prepandial:   less than 140 mg/dL      Peak postprandial:   less than 180 mg/dL (1-2 hours)      Critically ill patients:  140 - 180 mg/dL    Results for Ralph Griffin, Ralph SR. (MRN [id number]) as of 06/03/2019 11:32  Ref. Range 06/02/2019 13:01 06/02/2019 22:38 06/02/2019 23:16 06/03/2019 00:13 06/03/2019 05:15 06/03/2019 11:12  Glucose-Capillary Latest Ref Range: 70 - 99 mg/dL 86 42 (LL) 74 169 (H) 155 (H) 165 (H)   Results for Ralph Griffin, Ralph SR. (MRN [id number]) as of 06/03/2019 11:32  Ref. Range 06/03/2019 00:10  Hemoglobin A1C Latest Ref Range: 4.8 - 5.6 % 7.8 (H)    Admit with: Hypoglycemia/ CP/ Acute Kidney Injury  History: DM  Home DM Meds: Glipizide 10 mg BID       Metformin 1000 mg BID       Victoza 1.2 mg Daily  Current Orders: Novolog Sensitive Correction Scale/ SSI (0-9 units) TID AC + HS      Note Novolog SSi to start at 12pm today.  Current A1c of 7.8% shows decent CBG control at home prior to hospitalization.   MD- Note pt Hypoglycemic on admission.  If patient having issues with Hypoglycemia at home, may consider reducing his home dose of Glipizide to 5 mg BID at time of d/c home.      --Will follow patient during hospitalization--  Wyn Quaker RN, MSN, CDE Diabetes Coordinator Inpatient Glycemic Control Team Team Pager: [phone] (8a-5p)

## 2019-06-03 NOTE — ED Provider Notes (Signed)
[facility] EMERGENCY DEPARTMENT Provider Note   CSN: [id number] Arrival date & time: 06/02/19  1052    History   Chief Complaint Chief Complaint  Patient presents with   Hypoglycemia   Chest Pain    HPI Ralph Craighead Sr. is a 78 y.o. male.     HPI   78 year old male with chest pain.  Patient reports intermittent chest discomfort and dyspnea over the past couple weeks.  He has noticed that he has felt short of breath and had to stop to take a break when walking his dog.  Sometimes associated with dizziness.  Also some intermittent left-sided chest discomfort which also seems to be brought on by exertion.  Today he was at work at his desk when he started having more intense pain in his left anterior chest.  Associated with diaphoresis.  He has noticed that the pain seems to be worse when taking a deep breath.  Currently none to very minimal while at rest.  Reports left lower extremity swelling but this has been ongoing for the past year or so.  Denies any acute change.  No past history of DVT/PE.  He is obese, has a history of hyperlipidemia and hypertension but no diagnosed CAD.  He went to see his PCP earlier today for the above symptoms and was advised to come to the emergency room.  Past Medical History:  Diagnosis Date   Diabetes mellitus without complication ([place])    Hypertension     Patient Active Problem List   Diagnosis Date Noted   Hypoglycemia 06/02/2019   Cervical stenosis of spine 12/28/2018   Myelopathy ([place]) 12/28/2018    Past Surgical History:  Procedure Laterality Date   CERVICAL DISCECTOMY     POSTERIOR CERVICAL LAMINECTOMY N/A 12/27/2018   Procedure: Decompressive Cervical Laminectomy and Posterior Cervical Fusion Cervical three-four, four-five, five-six;  Surgeon: Kary Kos, MD;  Location: [facility];  Service: Neurosurgery;  Laterality: N/A;      Home Medications    Prior to Admission medications   Medication Sig Start Date  End Date Taking? Authorizing Provider  allopurinol (ZYLOPRIM) 100 MG tablet Take 100 mg by mouth daily. 11/01/18  Yes [provider]  aspirin EC 81 MG tablet Take 81 mg by mouth daily.   Yes [provider]  atorvastatin (LIPITOR) 20 MG tablet Take 20 mg by mouth at bedtime.  10/19/18  Yes [provider]  carvedilol (COREG) 12.5 MG tablet Take 12.5 mg by mouth 2 (two) times daily.  11/01/18  Yes [provider]  gabapentin (NEURONTIN) 100 MG capsule Take 100 mg by mouth 3 (three) times daily.   Yes [provider]  glipiZIDE (GLUCOTROL) 5 MG tablet Take 10 mg by mouth 2 (two) times daily before a meal.   Yes [provider]  hydrochlorothiazide (HYDRODIURIL) 25 MG tablet Take 25 mg by mouth daily. 11/01/18  Yes [provider]  ibuprofen (ADVIL) 200 MG tablet Take 400 mg by mouth every 6 (six) hours as needed (for pain or headaches).    Yes [provider]  metFORMIN (GLUCOPHAGE-XR) 500 MG 24 hr tablet Take 1,000 mg by mouth 2 (two) times daily. 11/01/18  Yes [provider]  Multiple Vitamin (MULTIVITAMIN WITH MINERALS) TABS tablet Take 1 tablet by mouth daily.   Yes [provider]  OS-CAL CALCIUM + D3 500-200 MG-UNIT TABS Take 1 tablet by mouth every morning. 09/24/18  Yes [provider]  rOPINIRole (REQUIP) 1 MG tablet  Take 1 mg by mouth at bedtime.  04/28/19  Yes [provider]  VICTOZA 18 MG/3ML SOPN Inject 1.2 mg into the skin daily before breakfast.  11/17/18  Yes [provider]  vitamin B-12 (CYANOCOBALAMIN) 1000 MCG tablet Take 1,000 mcg by mouth daily.   Yes [provider]  cholecalciferol (VITAMIN D3) 25 MCG (1000 UT) tablet Take 2,000 Units by mouth daily.    [provider]  clotrimazole-betamethasone (LOTRISONE) cream Apply 1 application topically daily. 04/25/19   [provider]  diclofenac (VOLTAREN) 75 MG EC tablet Take 75 mg by mouth 2  (two) times daily as needed for pain.  11/30/18   [provider]  HYDROcodone-acetaminophen (NORCO/VICODIN) 5-325 MG tablet Take 1-2 tablets by mouth every 6 (six) hours as needed for moderate pain. Patient not taking: Reported on 06/02/2019 12/31/18 12/31/19  Eleonore Chiquito, NP  methocarbamol (ROBAXIN) 500 MG tablet Take 1 tablet (500 mg total) by mouth 4 (four) times daily. Patient not taking: Reported on 06/02/2019 12/31/18   Meyran, Ocie Cornfield, NP    Family History History reviewed. No pertinent family history.  Social History Social History   Tobacco Use   Smoking status: Never Smoker   Smokeless tobacco: Never Used  Substance Use Topics   Alcohol use: Not Currently   Drug use: Not on file     Allergies   Patient has no known allergies.   Review of Systems Review of Systems  Cardiovascular: Positive for chest pain.    All systems reviewed and negative, other than as noted in HPI.  Physical Exam Updated Vital Signs BP 122/66 (BP Location: Left Arm)    Pulse 75    Temp 98.7 F (37.1 C) (Oral)    Resp 20    Ht 6\' 3"  (1.905 m)    Wt 121.3 kg    SpO2 97%    BMI 33.44 kg/m   Physical Exam Vitals signs and nursing note reviewed.  Constitutional:      General: He is not in acute distress.    Appearance: He is well-developed.  HENT:     Head: Normocephalic and atraumatic.  Eyes:     General:        Right eye: No discharge.        Left eye: No discharge.     Conjunctiva/sclera: Conjunctivae normal.  Neck:     Musculoskeletal: Neck supple.  Cardiovascular:     Rate and Rhythm: Normal rate and regular rhythm.     Heart sounds: Normal heart sounds. No murmur. No friction rub. No gallop.   Pulmonary:     Effort: Pulmonary effort is normal. No respiratory distress.     Breath sounds: Normal breath sounds.  Abdominal:     General: There is no distension.     Palpations: Abdomen is soft.     Tenderness: There is no abdominal tenderness.    Musculoskeletal:        General: No tenderness.     Left lower leg: Edema present.     Comments: Some swelling left lower extremity as compared to the right.  No calf tenderness.  Negative Homans.  Skin:    General: Skin is warm and dry.  Neurological:     Mental Status: He is alert.  Psychiatric:        Behavior: Behavior normal.        Thought Content: Thought content normal.      ED Treatments / Results  Labs (all labs  ordered are listed, but only abnormal results are displayed) Labs Reviewed  BASIC METABOLIC PANEL - Abnormal; Notable for the following components:      Result Value   Sodium 134 (*)    Glucose, Bld 115 (*)    BUN 31 (*)    Creatinine, Ser 1.46 (*)    Calcium 8.6 (*)    GFR calc non Af Amer 46 (*)    GFR calc Af Amer 53 (*)    All other components within normal limits  CBC - Abnormal; Notable for the following components:   WBC 18.8 (*)    RBC 3.55 (*)    Hemoglobin 10.7 (*)    HCT 33.4 (*)    All other components within normal limits  URINALYSIS, ROUTINE W REFLEX MICROSCOPIC - Abnormal; Notable for the following components:   APPearance HAZY (*)    Leukocytes,Ua LARGE (*)    Bacteria, UA RARE (*)    All other components within normal limits  HEPATIC FUNCTION PANEL - Abnormal; Notable for the following components:   Total Protein 6.0 (*)    Albumin 2.6 (*)    All other components within normal limits  HEMOGLOBIN A1C - Abnormal; Notable for the following components:   Hgb A1c MFr Bld 7.8 (*)    All other components within normal limits  MAGNESIUM - Abnormal; Notable for the following components:   Magnesium 1.6 (*)    All other components within normal limits  BASIC METABOLIC PANEL - Abnormal; Notable for the following components:   Sodium 133 (*)    Glucose, Bld 158 (*)    BUN 26 (*)    Calcium 8.0 (*)    GFR calc non Af Amer 57 (*)    All other components within normal limits  CBC - Abnormal; Notable for the following components:   WBC  13.7 (*)    RBC 3.42 (*)    Hemoglobin 10.2 (*)    HCT 31.9 (*)    All other components within normal limits  GLUCOSE, CAPILLARY - Abnormal; Notable for the following components:   Glucose-Capillary 169 (*)    All other components within normal limits  GLUCOSE, CAPILLARY - Abnormal; Notable for the following components:   Glucose-Capillary 155 (*)    All other components within normal limits  CBG MONITORING, ED - Abnormal; Notable for the following components:   Glucose-Capillary 42 (*)    All other components within normal limits  TROPONIN I (HIGH SENSITIVITY) - Abnormal; Notable for the following components:   Troponin I (High Sensitivity) 50 (*)    All other components within normal limits  TROPONIN I (HIGH SENSITIVITY) - Abnormal; Notable for the following components:   Troponin I (High Sensitivity) 33 (*)    All other components within normal limits  TROPONIN I (HIGH SENSITIVITY) - Abnormal; Notable for the following components:   Troponin I (High Sensitivity) 32 (*)    All other components within normal limits  TROPONIN I (HIGH SENSITIVITY) - Abnormal; Notable for the following components:   Troponin I (High Sensitivity) 34 (*)    All other components within normal limits  SARS CORONAVIRUS 2 (HOSPITAL ORDER, [place] [facility])  URINE CULTURE  PHOSPHORUS  GLUCOSE, CAPILLARY  CBG MONITORING, ED    EKG EKG Interpretation  Date/Time:  Thursday June 02 2019 11:29:48 EDT Ventricular Rate:  83 PR Interval:    QRS Duration: 94 QT Interval:  390 QTC Calculation: 458 R Axis:   22 Text  Interpretation:  Atrial fibrillation with premature ventricular or aberrantly conducted complexes Possible Inferior infarct , age undetermined Abnormal ECG No old tracing to compare Confirmed by Virgel Manifold [phone]) on 06/02/2019 11:52:30 AM   Radiology Dg Chest 2 View  Result Date: 06/02/2019 CLINICAL DATA:  Leukocytosis. EXAM: CHEST - 2 VIEW COMPARISON:  12/30/2018  FINDINGS: The cardiomediastinal contours are unchanged. Unchanged heart size. Minor bibasilar atelectasis. Pulmonary vasculature is normal. No consolidation, pleural effusion, or pneumothorax. No acute osseous abnormalities are seen. IMPRESSION: Minor bibasilar atelectasis.  No evidence of pneumonia. Electronically Signed   By: Keith Rake M.D.   On: 06/02/2019 22:05   Ct Head Wo Contrast  Result Date: 06/02/2019 CLINICAL DATA:  Neck surgery February 2020 with recent tremors. EXAM: CT HEAD WITHOUT CONTRAST TECHNIQUE: Contiguous axial images were obtained from the base of the skull through the vertex without intravenous contrast. COMPARISON:  12/11/2018 FINDINGS: Brain: Ventricles, cisterns and other CSF spaces are unchanged as there is minimal age related atrophic change. There is no mass, mass effect, shift of midline structures or acute hemorrhage. No evidence of acute infarction. Vascular: No hyperdense vessel or unexpected calcification. Skull: Normal. Negative for fracture or focal lesion. Sinuses/Orbits: No acute finding. Other: None. IMPRESSION: No acute findings. Mild age related atrophic change. Electronically Signed   By: Marin Olp M.D.   On: 06/02/2019 18:42   Mr Cervical Spine Wo Contrast  Result Date: 06/02/2019 CLINICAL DATA:  Radiculopathy. Cervical spine surgery in February 2020. Previous anterior and posterior fusion from C3 through C6. EXAM: MRI CERVICAL SPINE WITHOUT CONTRAST TECHNIQUE: Multiplanar, multisequence MR imaging of the cervical spine was performed. No intravenous contrast was administered. COMPARISON:  Radiographs dated 12/28/2018 and MRI and CT scan dated 12/27/2018 FINDINGS: Alignment: Physiologic. Vertebrae: No fracture, evidence of discitis, or bone lesion. No appreciable facet arthritis. Cord: Normal signal and morphology. Posterior Fossa, vertebral arteries, paraspinal tissues: No significant abnormality. Postsurgical changes anteriorly and posteriorly to the  expected degree. Disc levels: C2-3: Normal disc.  Slight right facet arthritis. C3-4: Anterior and posterior fusion.  No neural impingement. C4-5: Anterior and posterior fusion.  No neural impingement. C5-6: Anterior and posterior fusion. Tiny endplate osteophytes without neural impingement. C6-7: No disc bulging or protrusion. No neural impingement. Widely patent neural foramina. C7-T1: Tiny broad-based disc bulge with no neural impingement. Widely patent neural foramina. The visualized portion of the upper thoracic spine demonstrates no abnormality. IMPRESSION: 1. No acute abnormality of the cervical spine. 2. No spinal or foraminal stenosis. 3. No disc protrusions or focal neural impingement. 4. Anterior and posterior fusions from C3 through C6 with no visible complicating findings. Electronically Signed   By: Lorriane Shire M.D.   On: 06/02/2019 16:00    Procedures Procedures (including critical care time)  Medications Ordered in ED Medications  allopurinol (ZYLOPRIM) tablet 100 mg (has no administration in time range)  aspirin EC tablet 81 mg (81 mg Oral Given 06/02/19 2232)  carvedilol (COREG) tablet 12.5 mg (12.5 mg Oral Given 06/02/19 2231)  vitamin B-12 (CYANOCOBALAMIN) tablet 1,000 mcg (1,000 mcg Oral Not Given 06/02/19 2338)  gabapentin (NEURONTIN) capsule 100 mg (100 mg Oral Given 06/02/19 2356)  rOPINIRole (REQUIP) tablet 1 mg (1 mg Oral Given 06/03/19 0041)  cholecalciferol (VITAMIN D3) tablet 2,000 Units (has no administration in time range)  multivitamin with minerals tablet 1 tablet (1 tablet Oral Given 06/02/19 2232)  calcium-vitamin D (OSCAL WITH D) 500-200 MG-UNIT per tablet 1 tablet (1 tablet Oral Given 06/03/19 0533)  enoxaparin (  LOVENOX) injection 40 mg (has no administration in time range)  cefTRIAXone (ROCEPHIN) 1 g in sodium chloride 0.9 % 100 mL IVPB (has no administration in time range)  0.9 %  sodium chloride infusion ( Intravenous Stopped 06/02/19 2339)  insulin aspart  (novoLOG) injection 0-9 Units (0 Units Subcutaneous Not Given 06/03/19 0654)  dextrose 5 %-0.9 % sodium chloride infusion ( Intravenous New Bag/Given 06/02/19 2336)  sodium chloride flush (NS) 0.9 % injection 3 mL (3 mLs Intravenous Given 06/02/19 1550)  sodium chloride 0.9 % bolus 1,000 mL (0 mLs Intravenous Stopped 06/02/19 1919)  cefTRIAXone (ROCEPHIN) 1 g in sodium chloride 0.9 % 100 mL IVPB (0 g Intravenous Stopped 06/02/19 1620)     Initial Impression / Assessment and Plan / ED Course  I have reviewed the triage vital signs and the nursing notes.  Pertinent labs & imaging results that were available during my care of the patient were reviewed by me and considered in my medical decision making (see chart for details).  78 year old male with chest pain and dyspnea.  Both typical and atypical symptoms.  Pain does seem to have a pleuritic component.  He also describes exertional dyspnea although the pain he has been getting doesn't clearly correlate with the SOB.  He does have some swelling in his left lower extremity but, from his description, this seems more chronic in nature and not acutely changed.  Given the pleuritic component of his symptoms will check a d-dimer. Today's symptoms also associated with diaphoresis. Consider ACS.  EKG appears fairly stable from 2014.  Will check labs including a high-sensitivity troponin and also check a chest x-ray.  No overt infectious symptoms. Will check COVID though, particularly with initial impression that may need admission.   Thankfully, he ED w/u has been unrevealing. His symptoms are still somewhat concerning to me though. I think he needs provocative testing. He is not interested in admission. He has established outpt care. I think close outpt FU is reasonably safe at this time. Pt is comfortable with this plan. Advised him to start 325 mg of aspirin for the time being. Close return precautions were discussed for the interim.   Ralph Rainwater Sr. was  evaluated in Emergency Department on 06/03/2019 for the symptoms described in the history of present illness. He was evaluated in the context of the global COVID-19 pandemic, which necessitated consideration that the patient might be at risk for infection with the SARS-CoV-2 virus that causes COVID-19. Institutional protocols and algorithms that pertain to the evaluation of patients at risk for COVID-19 are in a state of rapid change based on information released by regulatory bodies including the CDC and federal and state organizations. These policies and algorithms were followed during the patient's care in the ED.   Final Clinical Impressions(s) / ED Diagnoses   Final diagnoses:  AKI (acute kidney injury) ([place])  Acute cystitis with hematuria    ED Discharge Orders    None       Virgel Manifold, MD 06/03/19 [phone]

## 2019-06-03 NOTE — Evaluation (Signed)
Physical Therapy Evaluation Patient Details Name: Ralph Thoma Sr. MRN: [id number] DOB: 05-Jun-1941 Today's Date: 06/03/2019   History of Present Illness  Pt is a 78 y/o male admitted secondary to chest pain and hypoclycemia. PMH includes DM, HTN, and cervical surgery.   Clinical Impression  Pt admitted secondary to problem above with deficits below. Pt requiring Min guard A for mobility using RW this session. Pt reports using WC for primary mobility, however, does use RW to ambulate short distances. Pt reports he has been working with HHPT; recommend resuming HHPT at d/c. Will continue to follow acutely to maximize functional mobility independence and safety.     Follow Up Recommendations Home health PT;Supervision for mobility/OOB    Equipment Recommendations  None recommended by PT    Recommendations for Other Services       Precautions / Restrictions Precautions Precautions: Fall Restrictions Weight Bearing Restrictions: No      Mobility  Bed Mobility Overal bed mobility: Needs Assistance Bed Mobility: Supine to Sit     Supine to sit: Supervision     General bed mobility comments: Supervision for safety.   Transfers Overall transfer level: Needs assistance Equipment used: Rolling walker (2 wheeled) Transfers: Sit to/from [facility] Sit to Stand: Min guard;From elevated surface Stand pivot transfers: Min guard       General transfer comment: Min guard A to stand from elevated surface. Min guard A for safety to transfer to chair with RW.   Ambulation/Gait                Stairs            Wheelchair Mobility    Modified Rankin (Stroke Patients Only)       Balance Overall balance assessment: Needs assistance Sitting-balance support: No upper extremity supported;Feet supported Sitting balance-Leahy Scale: Good     Standing balance support: Bilateral upper extremity supported;During functional activity Standing  balance-Leahy Scale: Poor Standing balance comment: Reliant on BUE support                              Pertinent Vitals/Pain Pain Assessment: No/denies pain    Home Living Family/patient expects to be discharged to:: Private residence Living Arrangements: Spouse/significant other Available Help at Discharge: Family;Available 24 hours/day Type of Home: House Home Access: Stairs to enter Entrance Stairs-Rails: None Entrance Stairs-Number of Steps: 1 Home Layout: One level Home Equipment: Walker - 2 wheels;Bedside commode;Wheelchair - manual      Prior Function Level of Independence: Needs assistance   Gait / Transfers Assistance Needed: Using WC for primary mobility, however, did ambulate short distances with RW.   ADL's / Homemaking Assistance Needed: Pt reports he sponge bathes.         Hand Dominance        Extremity/Trunk Assessment   Upper Extremity Assessment Upper Extremity Assessment: Generalized weakness    Lower Extremity Assessment Lower Extremity Assessment: Generalized weakness    Cervical / Trunk Assessment Cervical / Trunk Assessment: Other exceptions Cervical / Trunk Exceptions: recent cervical surgery   Communication   Communication: HOH  Cognition Arousal/Alertness: Awake/alert Behavior During Therapy: WFL for tasks assessed/performed Overall Cognitive Status: Within Functional Limits for tasks assessed                                        General Comments  Exercises     Assessment/Plan    PT Assessment Patient needs continued PT services  PT Problem List Decreased strength;Decreased balance;Decreased mobility;Decreased knowledge of use of DME;Decreased knowledge of precautions       PT Treatment Interventions DME instruction;Gait training;Stair training;Therapeutic exercise;Therapeutic activities;Functional mobility training;Balance training;Patient/family education    PT Goals (Current goals can  be found in the Care Plan section)  Acute Rehab PT Goals Patient Stated Goal: to go home PT Goal Formulation: With patient Time For Goal Achievement: 06/17/19 Potential to Achieve Goals: Good    Frequency Min 3X/week   Barriers to discharge        Co-evaluation               AM-PAC PT "6 Clicks" Mobility  Outcome Measure Help needed turning from your back to your side while in a flat bed without using bedrails?: None Help needed moving from lying on your back to sitting on the side of a flat bed without using bedrails?: A Little Help needed moving to and from a bed to a chair (including a wheelchair)?: A Little Help needed standing up from a chair using your arms (e.g., wheelchair or bedside chair)?: A Little Help needed to walk in hospital room?: A Little Help needed climbing 3-5 steps with a railing? : A Lot 6 Click Score: 18    End of Session Equipment Utilized During Treatment: Gait belt Activity Tolerance: Patient tolerated treatment well Patient left: in chair;with call bell/phone within reach Nurse Communication: Mobility status PT Visit Diagnosis: Unsteadiness on feet (R26.81);Muscle weakness (generalized) (M62.81)    Time: 3329-5188 PT Time Calculation (min) (ACUTE ONLY): 15 min   Charges:   PT Evaluation $PT Eval Low Complexity: [place], PT, DPT  Acute Rehabilitation Services  Pager: [phone] Office: [phone]   Rudean Hitt 06/03/2019, 1:33 PM

## 2019-06-03 NOTE — Discharge Summary (Signed)
Physician Discharge Summary  Ralph Rainwater Sr. NLZ:[id number] DOB: 21-Nov-1940 DOA: 06/02/2019  PCP: Ralph Griffin., MD  Admit date: 06/02/2019 Discharge date: 06/03/2019  Time spent: 35 minutes  Recommendations for Outpatient Follow-up:  1. PCP in 1 week, admitted with recurrent hypoglycemia, diabetic meds changed 2. Urology Dr. Joie Griffin in 2 to 3 weeks, needs CT or MRI to evaluate lesion on his right kidney   Discharge Diagnoses:  Principal Problem:   Hypoglycemia   Type 2 diabetes mellitus   Cervical stenosis status post laminectomy and discectomy   Right kidney mass   Type 2 diabetes mellitus without complication [facility])   Discharge Condition: Stable  Diet recommendation: Diabetic, heart healthy Filed Weights   06/02/19 1927 06/02/19 2300 06/03/19 0920  Weight: 117.9 kg 121.3 kg 120.6 kg    History of present illness:  Patient is a 78 year old male with past medical history significant for hypertension and diabetes mellitus.  Patient also underwent posterior cervical laminectomy/cervical discectomy in February of this year.  Patient presents with repeated recordings of low blood sugar recently.  According to the patient, his blood sugar was in the 50s this morning.  Patient is on multiple antidiabetic medications.  Patient's problem may have started 2 days ago with shaking for about 3 hours.  Patient's blood sugar was noted to be low at the time.  No loss of consciousness was reported and patient was aware of this check.  No prior history of seizures.  There is vague report of chest pain as per collateral information.  High sensitive troponin on presentation was 50, but repeat troponin was 33.  Patient does not endorse any chest pain at the moment.  No fever or chills.  Hospital Course:   Recurrent symptomatic hypoglycemia -Patient was noted to be on multiple diabetic medications namely glipizide Victoza and metformin, in addition he was noted to have mild renal failure on  admission likely from dehydration and NSAID use -Hemoglobin A1c noted to be 7.8, slightly higher A1c close to 8 or 8.5 is better tolerated in the elderly population due to risk of hypoglycemia -At discharge we have discontinued glipizide, recommended to continue metformin and decreased Victoza dose to 1.2, patient reports he was taking 1.8 mg prior to admission -Clinically improved hypoglycemia resolved, discharged home in a stable condition educated about above medication changes. 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 -Patient has a urologist in [place] Dr. Joie Griffin, patient informed regarding this finding and need for urgent follow-up with Dr. Nila Griffin and further imaging at that time  Rest of his medical problems were stable  Discharge Exam: Vitals:   06/03/19 0536 06/03/19 1108  BP: 122/66 137/69  Pulse: 75 77  Resp: 20 18  Temp: 98.7 F (37.1 C) 98.3 F (36.8 C)  SpO2: 97% 97%    General: AAOx3 Cardiovascular: S1S2/RRR Respiratory: CTAB  Discharge Instructions   Discharge Instructions    Diet - low sodium heart healthy  Complete by: As directed    Diet - low sodium heart healthy   Complete by: As directed    Diet Carb Modified   Complete by: As directed    Diet Carb Modified   Complete by: As directed    Increase activity slowly   Complete by: As directed    Increase activity  slowly   Complete by: As directed      Allergies as of 06/03/2019   No Known Allergies     Medication List    STOP taking these medications   diclofenac 75 MG EC tablet Commonly known as: VOLTAREN   glipiZIDE 5 MG tablet Commonly known as: GLUCOTROL   HYDROcodone-acetaminophen 5-325 MG tablet Commonly known as: NORCO/VICODIN   ibuprofen 200 MG tablet Commonly known as: ADVIL   methocarbamol 500 MG tablet Commonly known as: Robaxin     TAKE these medications   allopurinol 100 MG tablet Commonly known as: ZYLOPRIM Take 100 mg by mouth daily.   aspirin EC 81 MG tablet Take 81 mg by mouth daily.   atorvastatin 20 MG tablet Commonly known as: LIPITOR Take 20 mg by mouth at bedtime.   carvedilol 12.5 MG tablet Commonly known as: COREG Take 12.5 mg by mouth 2 (two) times daily.   cholecalciferol 25 MCG (1000 UT) tablet Commonly known as: VITAMIN D3 Take 2,000 Units by mouth daily.   clotrimazole-betamethasone cream Commonly known as: LOTRISONE Apply 1 application topically daily.   gabapentin 100 MG capsule Commonly known as: NEURONTIN Take 100 mg by mouth 3 (three) times daily.   hydrochlorothiazide 25 MG tablet Commonly known as: HYDRODIURIL Take 25 mg by mouth daily.   metFORMIN 500 MG 24 hr tablet Commonly known as: GLUCOPHAGE-XR Take 1,000 mg by mouth 2 (two) times daily.   multivitamin with minerals Tabs tablet Take 1 tablet by mouth daily.   Os-Cal Calcium + D3 500-200 MG-UNIT Tabs Generic drug: Calcium Carb-Cholecalciferol Take 1 tablet by mouth every morning.   rOPINIRole 1 MG tablet Commonly known as: REQUIP Take 1 mg by mouth at bedtime.   Victoza 18 MG/3ML Sopn Generic drug: liraglutide Inject 1.2 mg into the skin daily before breakfast.   vitamin B-12 1000 MCG tablet Commonly known as: CYANOCOBALAMIN Take 1,000 mcg by mouth daily.      No Known Allergies Follow-up Information    Ralph Griffin., MD. Schedule an appointment as  soon as possible for a visit in 1 week(s).   Specialty: Internal Medicine Contact information: [place] [address] [phone]        Ralph Bimler, MD. Schedule an appointment as soon as possible for a visit in 1 week(s).   Specialty: Urology Why: Need CT or MRI to assess mass on Right Kidney  Contact information: [address] [place] [address] [phone]            The results of significant diagnostics from this hospitalization (including imaging, microbiology, ancillary and laboratory) are listed below for reference.    Significant Diagnostic Studies: Dg Chest 2 View  Result Date: 06/02/2019 CLINICAL DATA:  Leukocytosis. EXAM: CHEST - 2 VIEW COMPARISON:  12/30/2018 FINDINGS: The cardiomediastinal contours are unchanged. Unchanged heart size. Minor bibasilar atelectasis. Pulmonary vasculature is normal. No consolidation, pleural effusion, or pneumothorax. No acute osseous abnormalities are seen. IMPRESSION: Minor bibasilar atelectasis.  No evidence of pneumonia. Electronically Signed   By: Keith Rake M.D.   On: 06/02/2019 22:05   Ct Head Wo  Contrast  Result Date: 06/02/2019 CLINICAL DATA:  Neck surgery February 2020 with recent tremors. EXAM: CT HEAD WITHOUT CONTRAST TECHNIQUE: Contiguous axial images were obtained from the base of the skull through the vertex without intravenous contrast. COMPARISON:  12/11/2018 FINDINGS: Brain: Ventricles, cisterns and other CSF spaces are unchanged as there is minimal age related atrophic change. There is no mass, mass effect, shift of midline structures or acute hemorrhage. No evidence of acute infarction. Vascular: No hyperdense vessel or unexpected calcification. Skull: Normal. Negative for fracture or focal lesion. Sinuses/Orbits: No acute finding. Other: None. IMPRESSION: No acute findings. Mild age related atrophic change. Electronically Signed   By: Marin Olp M.D.   On: 06/02/2019 18:42   Mr  Cervical Spine Wo Contrast  Result Date: 06/02/2019 CLINICAL DATA:  Radiculopathy. Cervical spine surgery in February 2020. Previous anterior and posterior fusion from C3 through C6. EXAM: MRI CERVICAL SPINE WITHOUT CONTRAST TECHNIQUE: Multiplanar, multisequence MR imaging of the cervical spine was performed. No intravenous contrast was administered. COMPARISON:  Radiographs dated 12/28/2018 and MRI and CT scan dated 12/27/2018 FINDINGS: Alignment: Physiologic. Vertebrae: No fracture, evidence of discitis, or bone lesion. No appreciable facet arthritis. Cord: Normal signal and morphology. Posterior Fossa, vertebral arteries, paraspinal tissues: No significant abnormality. Postsurgical changes anteriorly and posteriorly to the expected degree. Disc levels: C2-3: Normal disc.  Slight right facet arthritis. C3-4: Anterior and posterior fusion.  No neural impingement. C4-5: Anterior and posterior fusion.  No neural impingement. C5-6: Anterior and posterior fusion. Tiny endplate osteophytes without neural impingement. C6-7: No disc bulging or protrusion. No neural impingement. Widely patent neural foramina. C7-T1: Tiny broad-based disc bulge with no neural impingement. Widely patent neural foramina. The visualized portion of the upper thoracic spine demonstrates no abnormality. IMPRESSION: 1. No acute abnormality of the cervical spine. 2. No spinal or foraminal stenosis. 3. No disc protrusions or focal neural impingement. 4. Anterior and posterior fusions from C3 through C6 with no visible complicating findings. Electronically Signed   By: Lorriane Shire M.D.   On: 06/02/2019 16:00   US Renal  Result Date: 06/03/2019 CLINICAL DATA:  Acute kidney injury. EXAM: RENAL / URINARY TRACT ULTRASOUND COMPLETE COMPARISON:  CT scan of July 15, 2017. FINDINGS: Right Kidney: Renal measurements: 13.5 x 6.0 x 6.0 = volume: 253 mL. 2.5 cm rounded partially exophytic echogenic abnormality is seen arising from midpole cortex;  this appears to have increased in size compared to prior CT scan. Increased echogenicity of renal parenchyma is noted with cortical thinning. No hydronephrosis visualized. Left Kidney: Renal measurements: 12.1 x 8.0 x 7.9 cm = volume: 398 mL. Increased echogenicity of renal parenchyma is noted with cortical thinning. No mass or hydronephrosis visualized. Bladder: Appears normal for degree of bladder distention. IMPRESSION: Increased echogenicity of renal parenchyma is noted bilaterally suggesting medical renal disease. 2.5 cm partially exophytic rounded echogenic abnormality is seen arising from the midpole cortex of right kidney; its ultrasound characteristics suggest renal angiomyolipoma, although this abnormality does appear to be significantly increased in size compared to prior CT scan. Also, on the prior CT scan, this abnormality does not demonstrate characteristics typical of angiomyolipoma. Given these findings, further evaluation with MRI or repeat CT scan is recommended to rule out possible neoplasm. Electronically Signed   By: Marijo Conception M.D.   On: 06/03/2019 09:35    Microbiology: Recent Results (from the past 240 hour(s))  Urine culture     Status: Abnormal   Collection Time: 06/02/19  1:52 PM  Specimen: Urine, Random  Result Value Ref Range Status   Specimen Description URINE, RANDOM  Final   Special Requests   Final    NONE Performed at [facility], [address], [place], [place] [address]    Culture (A)  Final    >=100,000 COLONIES/mL MULTIPLE SPECIES PRESENT, SUGGEST RECOLLECTION   Report Status 06/03/2019 FINAL  Final  SARS Coronavirus 2 (CEPHEID - Performed in [place] hospital lab), Hosp Order     Status: None   Collection Time: 06/02/19  7:20 PM   Specimen: Nasopharyngeal Swab  Result Value Ref Range Status   SARS Coronavirus 2 NEGATIVE NEGATIVE Final    Comment: (NOTE) If result is NEGATIVE SARS-CoV-2 target nucleic acids are NOT DETECTED. The  SARS-CoV-2 RNA is generally detectable in upper and lower  respiratory specimens during the acute phase of infection. The lowest  concentration of SARS-CoV-2 viral copies this assay can detect is 250  copies / mL. A negative result does not preclude SARS-CoV-2 infection  and should not be used as the sole basis for treatment or other  patient management decisions.  A negative result may occur with  improper specimen collection / handling, submission of specimen other  than nasopharyngeal swab, presence of viral mutation(s) within the  areas targeted by this assay, and inadequate number of viral copies  (<250 copies / mL). A negative result must be combined with clinical  observations, patient history, and epidemiological information. If result is POSITIVE SARS-CoV-2 target nucleic acids are DETECTED. The SARS-CoV-2 RNA is generally detectable in upper and lower  respiratory specimens dur ing the acute phase of infection.  Positive  results are indicative of active infection with SARS-CoV-2.  Clinical  correlation with patient history and other diagnostic information is  necessary to determine patient infection status.  Positive results do  not rule out bacterial infection or co-infection with other viruses. If result is PRESUMPTIVE POSTIVE SARS-CoV-2 nucleic acids MAY BE PRESENT.   A presumptive positive result was obtained on the submitted specimen  and confirmed on repeat testing.  While 2019 novel coronavirus  (SARS-CoV-2) nucleic acids may be present in the submitted sample  additional confirmatory testing may be necessary for epidemiological  and / or clinical management purposes  to differentiate between  SARS-CoV-2 and other Sarbecovirus currently known to infect humans.  If clinically indicated additional testing with an alternate test  methodology [phone]) is advised. The SARS-CoV-2 RNA is generally  detectable in upper and lower respiratory sp ecimens during the acute   phase of infection. The expected result is Negative. Fact Sheet for Patients:  StrictlyIdeas.no Fact Sheet for Healthcare Providers: BankingDealers.co.za This test is not yet approved or cleared by the [place] FDA and has been authorized for detection and/or diagnosis of SARS-CoV-2 by FDA under an Emergency Use Authorization (EUA).  This EUA will remain in effect (meaning this test can be used) for the duration of the COVID-19 declaration under Section 564(b)(1) of the Act, 21 U.S.C. section 360bbb-3(b)(1), unless the authorization is terminated or revoked sooner. Performed at [facility], [place] [address], [place], [place] [address]      Labs: Basic Metabolic Panel: Recent Labs  Lab 06/02/19 1131 06/03/19 0010  NA 134* 133*  K 4.0 3.6  CL 101 101  CO2 22 22  GLUCOSE 115* 158*  BUN 31* 26*  CREATININE 1.46* 1.22  CALCIUM 8.6* 8.0*  MG  --  1.6*  PHOS  --  3.3  Liver Function Tests: Recent Labs  Lab 06/02/19 2136  AST 27  ALT 20  ALKPHOS 71  BILITOT 0.6  PROT 6.0*  ALBUMIN 2.6*   No results for input(s): LIPASE, AMYLASE in the last 168 hours. No results for input(s): AMMONIA in the last 168 hours. CBC: Recent Labs  Lab 06/02/19 1131 06/03/19 0010  WBC 18.8* 13.7*  HGB 10.7* 10.2*  HCT 33.4* 31.9*  MCV 94.1 93.3  PLT 233 225   Cardiac Enzymes: No results for input(s): CKTOTAL, CKMB, CKMBINDEX, TROPONINI in the last 168 hours. BNP: BNP (last 3 results) No results for input(s): BNP in the last 8760 hours.  ProBNP (last 3 results) No results for input(s): PROBNP in the last 8760 hours.  CBG: Recent Labs  Lab 06/02/19 2238 06/02/19 2316 06/03/19 0013 06/03/19 0515 06/03/19 1112  GLUCAP 42* 74 169* 155* 165*       Signed:  Domenic Polite MD.  [facility] 06/03/2019, 2:30 PM

## 2019-06-03 NOTE — Plan of Care (Signed)
  Problem: Coping: Goal: Level of anxiety will decrease Outcome: Completed/Met   Problem: Elimination: Goal: Will not experience complications related to bowel motility Outcome: Completed/Met Goal: Will not experience complications related to urinary retention Outcome: Completed/Met   Problem: Pain Managment: Goal: General experience of comfort will improve Outcome: Completed/Met

## 2019-09-26 DIAGNOSIS — G2581 Restless legs syndrome: Secondary | ICD-10-CM | POA: Insufficient documentation

## 2019-10-04 IMAGING — DX DG CHEST 1V
1 series · 1 of 1 positions shown · non-contrast
Comparison: Radiographs December 24, 2018.

CLINICAL DATA: Shortness of breath.

EXAM:
CHEST  1 VIEW

[chest]
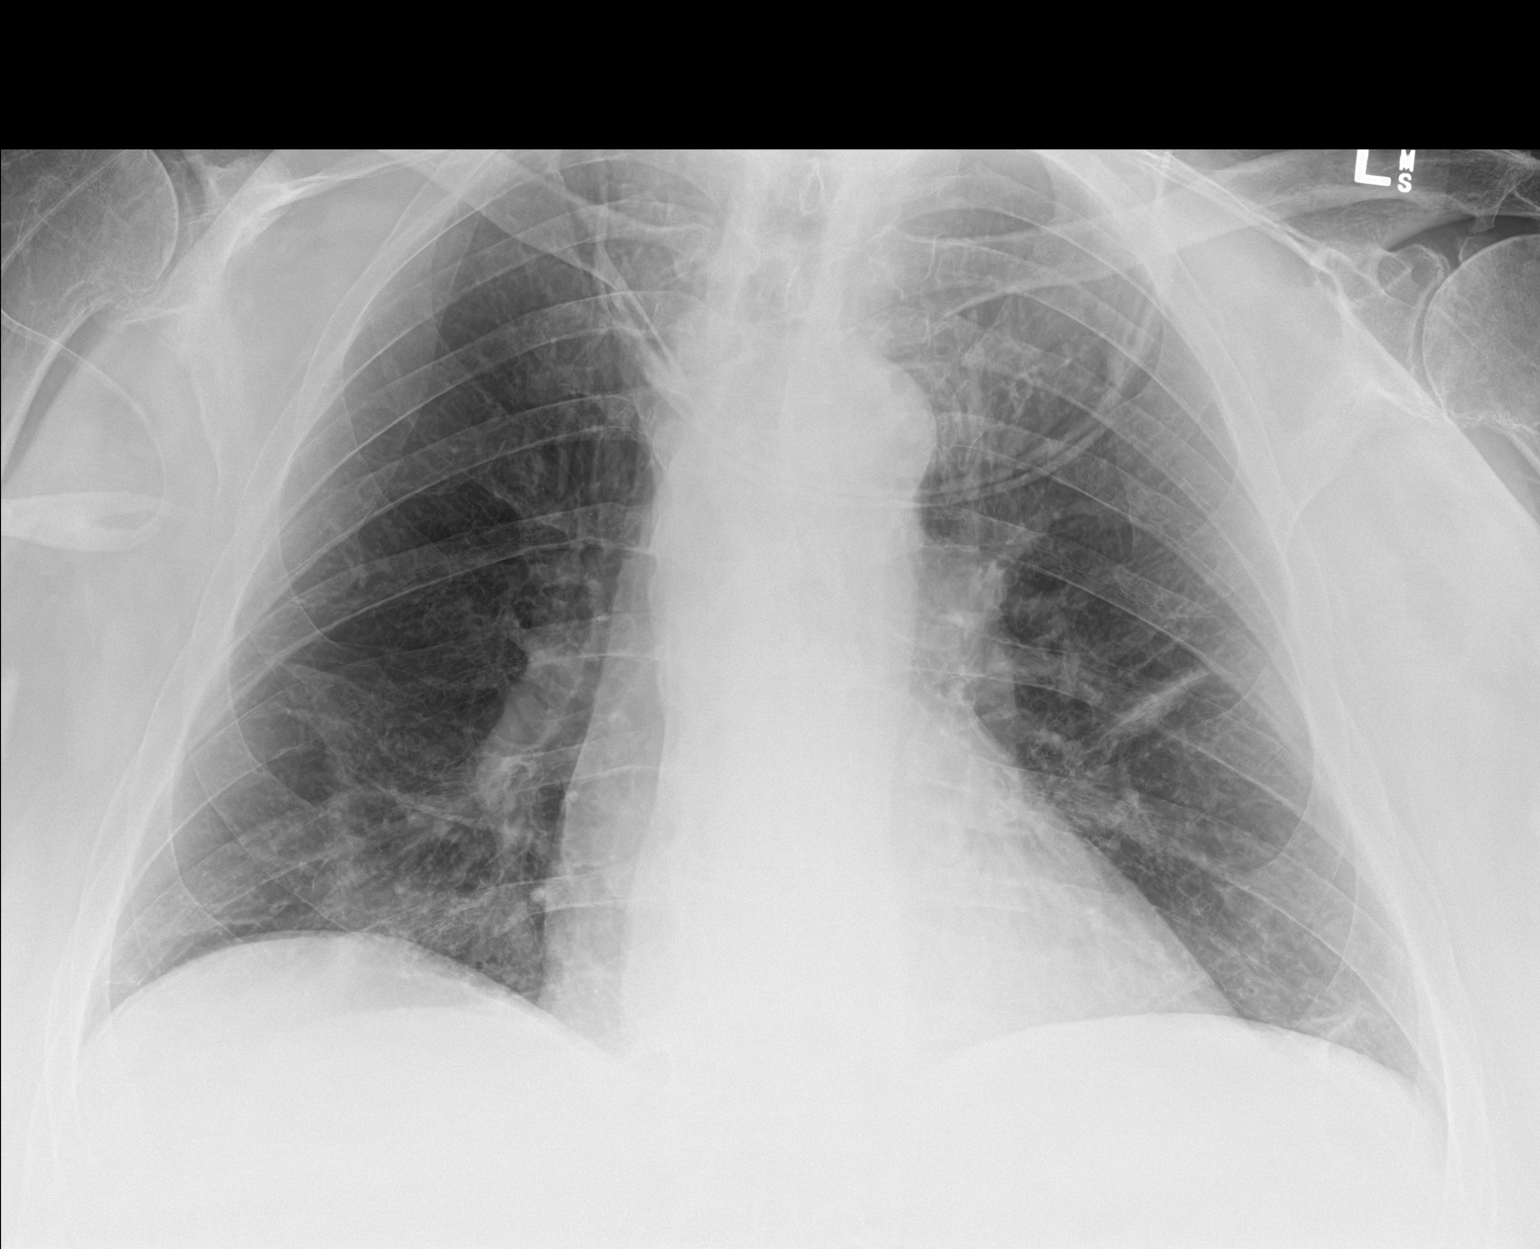

[1 of 1 positions shown; findings below may reference images not displayed]

FINDINGS: Stable cardiomediastinal silhouette. Mild left lingular subsegmental
atelectasis is noted. Mild right basilar subsegmental atelectasis is
noted. No pneumothorax or pleural effusion is noted. The visualized
skeletal structures are unremarkable.
IMPRESSION: Mild left lingular subsegmental atelectasis with mild right basilar
subsegmental atelectasis.

## 2020-01-04 DIAGNOSIS — G8929 Other chronic pain: Secondary | ICD-10-CM | POA: Insufficient documentation

## 2020-01-27 DIAGNOSIS — E038 Other specified hypothyroidism: Secondary | ICD-10-CM | POA: Insufficient documentation

## 2020-07-26 DIAGNOSIS — I4821 Permanent atrial fibrillation: Secondary | ICD-10-CM | POA: Insufficient documentation

## 2020-07-26 DIAGNOSIS — K219 Gastro-esophageal reflux disease without esophagitis: Secondary | ICD-10-CM | POA: Insufficient documentation

## 2020-07-26 DIAGNOSIS — R1084 Generalized abdominal pain: Secondary | ICD-10-CM | POA: Insufficient documentation

## 2020-07-28 DIAGNOSIS — R748 Abnormal levels of other serum enzymes: Secondary | ICD-10-CM | POA: Insufficient documentation

## 2020-07-28 DIAGNOSIS — N1831 Chronic kidney disease, stage 3a: Secondary | ICD-10-CM | POA: Insufficient documentation

## 2020-08-17 ENCOUNTER — Other Ambulatory Visit: Payer: Self-pay | Admitting: Hematology and Oncology

## 2020-08-17 DIAGNOSIS — D509 Iron deficiency anemia, unspecified: Secondary | ICD-10-CM

## 2020-08-17 DIAGNOSIS — C180 Malignant neoplasm of cecum: Secondary | ICD-10-CM | POA: Diagnosis not present

## 2020-08-17 DIAGNOSIS — C787 Secondary malignant neoplasm of liver and intrahepatic bile duct: Secondary | ICD-10-CM | POA: Diagnosis not present

## 2020-08-17 LAB — BASIC METABOLIC PANEL
BUN: 17 (ref 4–21)
CO2: 21 (ref 13–22)
Chloride: 103 (ref 99–108)
Creatinine: 0.9 (ref 0.6–1.3)
Glucose: 119
Potassium: 4.2 (ref 3.4–5.3)
Sodium: 137 (ref 137–147)

## 2020-08-17 LAB — HEPATIC FUNCTION PANEL
ALT: 28 (ref 10–40)
AST: 39 (ref 14–40)
Alkaline Phosphatase: 436 — AB (ref 25–125)
Bilirubin, Total: 0.5

## 2020-08-17 LAB — CBC AND DIFFERENTIAL
HCT: 32 — AB (ref 41–53)
Hemoglobin: 10.3 — AB (ref 13.5–17.5)
Neutrophils Absolute: 8
Platelets: 446 — AB (ref 150–399)
WBC: 10.1

## 2020-08-17 LAB — COMPREHENSIVE METABOLIC PANEL
Albumin: 3.7 (ref 3.5–5.0)
Calcium: 9.2 (ref 8.7–10.7)

## 2020-08-17 LAB — CBC: RBC: 3.79 — AB (ref 3.87–5.11)

## 2020-08-22 ENCOUNTER — Other Ambulatory Visit: Payer: Self-pay | Admitting: Oncology

## 2020-08-22 DIAGNOSIS — C180 Malignant neoplasm of cecum: Secondary | ICD-10-CM | POA: Insufficient documentation

## 2020-08-22 DIAGNOSIS — C20 Malignant neoplasm of rectum: Secondary | ICD-10-CM

## 2020-08-22 DIAGNOSIS — C787 Secondary malignant neoplasm of liver and intrahepatic bile duct: Secondary | ICD-10-CM

## 2020-08-30 ENCOUNTER — Other Ambulatory Visit: Payer: Self-pay | Admitting: Oncology

## 2020-09-04 ENCOUNTER — Other Ambulatory Visit: Payer: Self-pay | Admitting: Oncology

## 2020-09-05 ENCOUNTER — Inpatient Hospital Stay: Payer: Medicare HMO | Attending: Hematology and Oncology | Admitting: Hematology and Oncology

## 2020-09-05 ENCOUNTER — Other Ambulatory Visit: Payer: Self-pay

## 2020-09-05 DIAGNOSIS — C180 Malignant neoplasm of cecum: Secondary | ICD-10-CM

## 2020-09-05 NOTE — Patient Instructions (Addendum)
Fluorouracil, 5-FU injection What is this medicine? FLUOROURACIL, 5-FU (flure oh YOOR a sil) is a chemotherapy drug. It slows the growth of cancer cells. This medicine is used to treat many types of cancer like breast cancer, colon or rectal cancer, pancreatic cancer, and stomach cancer. This medicine may be used for other purposes; ask your health care provider or pharmacist if you have questions. COMMON BRAND NAME(S): Adrucil What should I tell my health care provider before I take this medicine? They need to know if you have any of these conditions:  blood disorders  dihydropyrimidine dehydrogenase (DPD) deficiency  infection (especially a virus infection such as chickenpox, cold sores, or herpes)  kidney disease  liver disease  malnourished, poor nutrition  recent or ongoing radiation therapy  an unusual or allergic reaction to fluorouracil, other chemotherapy, other medicines, foods, dyes, or preservatives  pregnant or trying to get pregnant  breast-feeding How should I use this medicine? This drug is given as an infusion or injection into a vein. It is administered in a hospital or clinic by a specially trained health care professional. Talk to your pediatrician regarding the use of this medicine in children. Special care may be needed. Overdosage: If you think you have taken too much of this medicine contact a poison control center or emergency room at once. NOTE: This medicine is only for you. Do not share this medicine with others. What if I miss a dose? It is important not to miss your dose. Call your doctor or health care professional if you are unable to keep an appointment. What may interact with this medicine?  allopurinol  cimetidine  dapsone  digoxin  hydroxyurea  leucovorin  levamisole  medicines for seizures like ethotoin, fosphenytoin, phenytoin  medicines to increase blood counts like filgrastim, pegfilgrastim, sargramostim  medicines that  treat or prevent blood clots like warfarin, enoxaparin, and dalteparin  methotrexate  metronidazole  pyrimethamine  some other chemotherapy drugs like busulfan, cisplatin, estramustine, vinblastine  trimethoprim  trimetrexate  vaccines Talk to your doctor or health care professional before taking any of these medicines:  acetaminophen  aspirin  ibuprofen  ketoprofen  naproxen This list may not describe all possible interactions. Give your health care provider a list of all the medicines, herbs, non-prescription drugs, or dietary supplements you use. Also tell them if you smoke, drink alcohol, or use illegal drugs. Some items may interact with your medicine. What should I watch for while using this medicine? Visit your doctor for checks on your progress. This drug may make you feel generally unwell. This is not uncommon, as chemotherapy can affect healthy cells as well as cancer cells. Report any side effects. Continue your course of treatment even though you feel ill unless your doctor tells you to stop. In some cases, you may be given additional medicines to help with side effects. Follow all directions for their use. Call your doctor or health care professional for advice if you get a fever, chills or sore throat, or other symptoms of a cold or flu. Do not treat yourself. This drug decreases your body's ability to fight infections. Try to avoid being around people who are sick. This medicine may increase your risk to bruise or bleed. Call your doctor or health care professional if you notice any unusual bleeding. Be careful brushing and flossing your teeth or using a toothpick because you may get an infection or bleed more easily. If you have any dental work done, tell your dentist you are  receiving this medicine. Avoid taking products that contain aspirin, acetaminophen, ibuprofen, naproxen, or ketoprofen unless instructed by your doctor. These medicines may hide a fever. Do not  become pregnant while taking this medicine. Women should inform their doctor if they wish to become pregnant or think they might be pregnant. There is a potential for serious side effects to an unborn child. Talk to your health care professional or pharmacist for more information. Do not breast-feed an infant while taking this medicine. Men should inform their doctor if they wish to father a child. This medicine may lower sperm counts. Do not treat diarrhea with over the counter products. Contact your doctor if you have diarrhea that lasts more than 2 days or if it is severe and watery. This medicine can make you more sensitive to the sun. Keep out of the sun. If you cannot avoid being in the sun, wear protective clothing and use sunscreen. Do not use sun lamps or tanning beds/booths. What side effects may I notice from receiving this medicine? Side effects that you should report to your doctor or health care professional as soon as possible:  allergic reactions like skin rash, itching or hives, swelling of the face, lips, or tongue  low blood counts - this medicine may decrease the number of white blood cells, red blood cells and platelets. You may be at increased risk for infections and bleeding.  signs of infection - fever or chills, cough, sore throat, pain or difficulty passing urine  signs of decreased platelets or bleeding - bruising, pinpoint red spots on the skin, black, tarry stools, blood in the urine  signs of decreased red blood cells - unusually weak or tired, fainting spells, lightheadedness  breathing problems  changes in vision  chest pain  mouth sores  nausea and vomiting  pain, swelling, redness at site where injected  pain, tingling, numbness in the hands or feet  redness, swelling, or sores on hands or feet  stomach pain  unusual bleeding Side effects that usually do not require medical attention (report to your doctor or health care professional if they  continue or are bothersome):  changes in finger or toe nails  diarrhea  dry or itchy skin  hair loss  headache  loss of appetite  sensitivity of eyes to the light  stomach upset  unusually teary eyes This list may not describe all possible side effects. Call your doctor for medical advice about side effects. You may report side effects to FDA at 1-800-FDA-1088. Where should I keep my medicine? This drug is given in a hospital or clinic and will not be stored at home. NOTE: This sheet is a summary. It may not cover all possible information. If you have questions about this medicine, talk to your doctor, pharmacist, or health care provider.  2020 Elsevier/Gold Standard (2008-03-01 13:53:16) Oxaliplatin Injection What is this medicine? OXALIPLATIN (ox AL i PLA tin) is a chemotherapy drug. It targets fast dividing cells, like cancer cells, and causes these cells to die. This medicine is used to treat cancers of the colon and rectum, and many other cancers. This medicine may be used for other purposes; ask your health care provider or pharmacist if you have questions. COMMON BRAND NAME(S): Eloxatin What should I tell my health care provider before I take this medicine? They need to know if you have any of these conditions:  heart disease  history of irregular heartbeat  liver disease  low blood counts, like white cells, platelets, or  red blood cells  lung or breathing disease, like asthma  take medicines that treat or prevent blood clots  tingling of the fingers or toes, or other nerve disorder  an unusual or allergic reaction to oxaliplatin, other chemotherapy, other medicines, foods, dyes, or preservatives  pregnant or trying to get pregnant  breast-feeding How should I use this medicine? This drug is given as an infusion into a vein. It is administered in a hospital or clinic by a specially trained health care professional. Talk to your pediatrician regarding the  use of this medicine in children. Special care may be needed. Overdosage: If you think you have taken too much of this medicine contact a poison control center or emergency room at once. NOTE: This medicine is only for you. Do not share this medicine with others. What if I miss a dose? It is important not to miss a dose. Call your doctor or health care professional if you are unable to keep an appointment. What may interact with this medicine? Do not take this medicine with any of the following medications:  cisapride  dronedarone  pimozide  thioridazine This medicine may also interact with the following medications:  aspirin and aspirin-like medicines  certain medicines that treat or prevent blood clots like warfarin, apixaban, dabigatran, and rivaroxaban  cisplatin  cyclosporine  diuretics  medicines for infection like acyclovir, adefovir, amphotericin B, bacitracin, cidofovir, foscarnet, ganciclovir, gentamicin, pentamidine, vancomycin  NSAIDs, medicines for pain and inflammation, like ibuprofen or naproxen  other medicines that prolong the QT interval (an abnormal heart rhythm)  pamidronate  zoledronic acid This list may not describe all possible interactions. Give your health care provider a list of all the medicines, herbs, non-prescription drugs, or dietary supplements you use. Also tell them if you smoke, drink alcohol, or use illegal drugs. Some items may interact with your medicine. What should I watch for while using this medicine? Your condition will be monitored carefully while you are receiving this medicine. You may need blood work done while you are taking this medicine. This medicine may make you feel generally unwell. This is not uncommon as chemotherapy can affect healthy cells as well as cancer cells. Report any side effects. Continue your course of treatment even though you feel ill unless your healthcare professional tells you to stop. This medicine can  make you more sensitive to cold. Do not drink cold drinks or use ice. Cover exposed skin before coming in contact with cold temperatures or cold objects. When out in cold weather wear warm clothing and cover your mouth and nose to warm the air that goes into your lungs. Tell your doctor if you get sensitive to the cold. Do not become pregnant while taking this medicine or for 9 months after stopping it. Women should inform their health care professional if they wish to become pregnant or think they might be pregnant. Men should not father a child while taking this medicine and for 6 months after stopping it. There is potential for serious side effects to an unborn child. Talk to your health care professional for more information. Do not breast-feed a child while taking this medicine or for 3 months after stopping it. This medicine has caused ovarian failure in some women. This medicine may make it more difficult to get pregnant. Talk to your health care professional if you are concerned about your fertility. This medicine has caused decreased sperm counts in some men. This may make it more difficult to father a child.  Talk to your health care professional if you are concerned about your fertility. This medicine may increase your risk of getting an infection. Call your health care professional for advice if you get a fever, chills, or sore throat, or other symptoms of a cold or flu. Do not treat yourself. Try to avoid being around people who are sick. Avoid taking medicines that contain aspirin, acetaminophen, ibuprofen, naproxen, or ketoprofen unless instructed by your health care professional. These medicines may hide a fever. Be careful brushing or flossing your teeth or using a toothpick because you may get an infection or bleed more easily. If you have any dental work done, tell your dentist you are receiving this medicine. What side effects may I notice from receiving this medicine? Side effects that  you should report to your doctor or health care professional as soon as possible:  allergic reactions like skin rash, itching or hives, swelling of the face, lips, or tongue  breathing problems  cough  low blood counts - this medicine may decrease the number of white blood cells, red blood cells, and platelets. You may be at increased risk for infections and bleeding  nausea, vomiting  pain, redness, or irritation at site where injected  pain, tingling, numbness in the hands or feet  signs and symptoms of bleeding such as bloody or black, tarry stools; red or dark brown urine; spitting up blood or brown material that looks like coffee grounds; red spots on the skin; unusual bruising or bleeding from the eyes, gums, or nose  signs and symptoms of a dangerous change in heartbeat or heart rhythm like chest pain; dizziness; fast, irregular heartbeat; palpitations; feeling faint or lightheaded; falls  signs and symptoms of infection like fever; chills; cough; sore throat; pain or trouble passing urine  signs and symptoms of liver injury like dark yellow or brown urine; general ill feeling or flu-like symptoms; light-colored stools; loss of appetite; nausea; right upper belly pain; unusually weak or tired; yellowing of the eyes or skin  signs and symptoms of low red blood cells or anemia such as unusually weak or tired; feeling faint or lightheaded; falls  signs and symptoms of muscle injury like dark urine; trouble passing urine or change in the amount of urine; unusually weak or tired; muscle pain; back pain Side effects that usually do not require medical attention (report to your doctor or health care professional if they continue or are bothersome):  changes in taste  diarrhea  gas  hair loss  loss of appetite  mouth sores This list may not describe all possible side effects. Call your doctor for medical advice about side effects. You may report side effects to FDA at  1-800-FDA-1088. Where should I keep my medicine? This drug is given in a hospital or clinic and will not be stored at home. NOTE: This sheet is a summary. It may not cover all possible information. If you have questions about this medicine, talk to your doctor, pharmacist, or health care provider.  2020 Elsevier/Gold Standard (2019-03-16 12:20:35) Leucovorin injection What is this medicine? LEUCOVORIN (loo koe VOR in) is used to prevent or treat the harmful effects of some medicines. This medicine is used to treat anemia caused by a low amount of folic acid in the body. It is also used with 5-fluorouracil (5-FU) to treat colon cancer. This medicine may be used for other purposes; ask your health care provider or pharmacist if you have questions. What should I tell my health care  provider before I take this medicine? They need to know if you have any of these conditions:  anemia from low levels of vitamin B-12 in the blood  an unusual or allergic reaction to leucovorin, folic acid, other medicines, foods, dyes, or preservatives  pregnant or trying to get pregnant  breast-feeding How should I use this medicine? This medicine is for injection into a muscle or into a vein. It is given by a health care professional in a hospital or clinic setting. Talk to your pediatrician regarding the use of this medicine in children. Special care may be needed. Overdosage: If you think you have taken too much of this medicine contact a poison control center or emergency room at once. NOTE: This medicine is only for you. Do not share this medicine with others. What if I miss a dose? This does not apply. What may interact with this medicine?  capecitabine  fluorouracil  phenobarbital  phenytoin  primidone  trimethoprim-sulfamethoxazole This list may not describe all possible interactions. Give your health care provider a list of all the medicines, herbs, non-prescription drugs, or dietary  supplements you use. Also tell them if you smoke, drink alcohol, or use illegal drugs. Some items may interact with your medicine. What should I watch for while using this medicine? Your condition will be monitored carefully while you are receiving this medicine. This medicine may increase the side effects of 5-fluorouracil, 5-FU. Tell your doctor or health care professional if you have diarrhea or mouth sores that do not get better or that get worse. What side effects may I notice from receiving this medicine? Side effects that you should report to your doctor or health care professional as soon as possible:  allergic reactions like skin rash, itching or hives, swelling of the face, lips, or tongue  breathing problems  fever, infection  mouth sores  unusual bleeding or bruising  unusually weak or tired Side effects that usually do not require medical attention (report to your doctor or health care professional if they continue or are bothersome):  constipation or diarrhea  loss of appetite  nausea, vomiting This list may not describe all possible side effects. Call your doctor for medical advice about side effects. You may report side effects to FDA at 1-800-FDA-1088. Where should I keep my medicine? This drug is given in a hospital or clinic and will not be stored at home. NOTE: This sheet is a summary. It may not cover all possible information. If you have questions about this medicine, talk to your doctor, pharmacist, or health care provider.  2020 Elsevier/Gold Standard (2008-05-02 16:50:29) Bevacizumab injection What is this medicine? BEVACIZUMAB (be va SIZ yoo mab) is a monoclonal antibody. It is used to treat many types of cancer. This medicine may be used for other purposes; ask your health care provider or pharmacist if you have questions. COMMON BRAND NAME(S): Avastin, MVASI, Zirabev What should I tell my health care provider before I take this medicine? They need to  know if you have any of these conditions:  diabetes  heart disease  high blood pressure  history of coughing up blood  prior anthracycline chemotherapy (e.g., doxorubicin, daunorubicin, epirubicin)  recent or ongoing radiation therapy  recent or planning to have surgery  stroke  an unusual or allergic reaction to bevacizumab, hamster proteins, mouse proteins, other medicines, foods, dyes, or preservatives  pregnant or trying to get pregnant  breast-feeding How should I use this medicine? This medicine is for infusion into a  vein. It is given by a health care professional in a hospital or clinic setting. Talk to your pediatrician regarding the use of this medicine in children. Special care may be needed. Overdosage: If you think you have taken too much of this medicine contact a poison control center or emergency room at once. NOTE: This medicine is only for you. Do not share this medicine with others. What if I miss a dose? It is important not to miss your dose. Call your doctor or health care professional if you are unable to keep an appointment. What may interact with this medicine? Interactions are not expected. This list may not describe all possible interactions. Give your health care provider a list of all the medicines, herbs, non-prescription drugs, or dietary supplements you use. Also tell them if you smoke, drink alcohol, or use illegal drugs. Some items may interact with your medicine. What should I watch for while using this medicine? Your condition will be monitored carefully while you are receiving this medicine. You will need important blood work and urine testing done while you are taking this medicine. This medicine may increase your risk to bruise or bleed. Call your doctor or health care professional if you notice any unusual bleeding. Before having surgery, talk to your health care provider to make sure it is ok. This drug can increase the risk of poor healing  of your surgical site or wound. You will need to stop this drug for 28 days before surgery. After surgery, wait at least 28 days before restarting this drug. Make sure the surgical site or wound is healed enough before restarting this drug. Talk to your health care provider if questions. Do not become pregnant while taking this medicine or for 6 months after stopping it. Women should inform their doctor if they wish to become pregnant or think they might be pregnant. There is a potential for serious side effects to an unborn child. Talk to your health care professional or pharmacist for more information. Do not breast-feed an infant while taking this medicine and for 6 months after the last dose. This medicine has caused ovarian failure in some women. This medicine may interfere with the ability to have a child. You should talk to your doctor or health care professional if you are concerned about your fertility. What side effects may I notice from receiving this medicine? Side effects that you should report to your doctor or health care professional as soon as possible:  allergic reactions like skin rash, itching or hives, swelling of the face, lips, or tongue  chest pain or chest tightness  chills  coughing up blood  high fever  seizures  severe constipation  signs and symptoms of bleeding such as bloody or black, tarry stools; red or dark-brown urine; spitting up blood or brown material that looks like coffee grounds; red spots on the skin; unusual bruising or bleeding from the eye, gums, or nose  signs and symptoms of a blood clot such as breathing problems; chest pain; severe, sudden headache; pain, swelling, warmth in the leg  signs and symptoms of a stroke like changes in vision; confusion; trouble speaking or understanding; severe headaches; sudden numbness or weakness of the face, arm or leg; trouble walking; dizziness; loss of balance or coordination  stomach  pain  sweating  swelling of legs or ankles  vomiting  weight gain Side effects that usually do not require medical attention (report to your doctor or health care professional if they continue  or are bothersome):  back pain  changes in taste  decreased appetite  dry skin  nausea  tiredness This list may not describe all possible side effects. Call your doctor for medical advice about side effects. You may report side effects to FDA at 1-800-FDA-1088. Where should I keep my medicine? This drug is given in a hospital or clinic and will not be stored at home. NOTE: This sheet is a summary. It may not cover all possible information. If you have questions about this medicine, talk to your doctor, pharmacist, or health care provider.  2020 Elsevier/Gold Standard (2019-08-24 10:50:46)  Managing Chemotherapy Side Effects, Adult Chemotherapy is a treatment that uses medicine to kill cancer cells. Chemotherapy causes side effects. The specific side effects depend on the specific medicines used. Most of the side effects of chemotherapy go away once treatment is finished. Until then, work closely with your health care providers and take an active role in managing your side effects. What are common side effects?  Tiredness (fatigue).  Increased risk of infections, bruising, or bleeding.  Nausea and vomiting.  Constipation or diarrhea.  Appetite loss.  Hair loss.  Mouth or throat sores.  Tingling, pain, or numbness in the hands and feet.  Dry, sensitive, itchy, or sore skin.  Confusion, anxiety, or mood swings.  Memory changes. How can I help manage my side effects? Medicines  Take over-the-counter and prescription medicines only as told by your health care provider.  Talk with your health care provider before taking vitamins, supplements, and over-the-counter medicines. Some of these can interfere with chemotherapy. Activity   Get plenty of rest.  Get regular  exercise by doing activities such as walking, gentle yoga, or tai chi.  Return to your normal activities as told by your health care provider. Ask your health care provider what activities are safe for you. Eating and drinking  Talk to a dietitian about what you should eat and drink during cancer treatment.  Drink enough fluid to keep your urine pale yellow.  If you have side effects that affect eating, these tips may help: ? Eat smaller meals and snacks often. ? Drink high-nutrition and high-calorie shakes or supplements. ? Eat bland and soft foods that are easy to eat. ? Do not eat foods that are hot, spicy, or hard to swallow.  Do not eat raw or undercooked meat, eggs, or seafood.  Always wash fresh fruits and vegetables well before eating them. General instructions  Learn as much as you can about your condition.  Keep all follow-up visits as told by your health care provider. This is important.  Use mouth rinse only as told by your health care provider.  Protect your skin from the sun by using sunscreen or wearing protective clothing and a hat.  If you have sore or itchy skin: ? Wear soft, comfortable clothing. ? Apply creams and ointments to your skin as told by your health care provider.  If you lose your hair, wear a wig, hat, or scarf to cover your head. You may want to have someone shave your head as you start to lose hair.  Meet with a hair and skin care specialist for makeup and skin care tips. How can I prevent infection and bleeding? Chemotherapy may lower your blood counts and put you at risk for infection and bleeding. Here are some ways to help prevent problems. Vaccines  Talk to your health care provider about vaccines. You should not get any live vaccines, such as  the polio, MMR, chicken pox, and shingles vaccines. Do not be around people who have had live vaccines.  Make sure you get a yearly flu shot. People who will be near you should also get a yearly  flu shot. Social activity  Stay away from crowded places where you could be exposed to germs.  Do not be around people who may be sick.  Do not share food or utensils with other people.  Wear a mask when outside the home if your blood counts are low. Cleanliness   Wash your hands often. Also make sure that other members of your household wash their hands often.  Brush your teeth daily using a soft toothbrush. General tips  Take your temperature regularly, especially if you have chills or feel warm.  Check with your health care provider before you: ? Travel. ? Have a dental procedure.  If you get chemotherapy through an IV or port, check the site every day for signs of infection. Check for redness, swelling, pain, fluid, and warmth.  Avoid activities that put you at risk for injury.  Use an electric razor to shave instead of a blade. Questions to ask your health care provider  What are the most common side effects of my treatment?  How will they affect my daily life?  What can I do to manage them?  When can I expect them to end?  What are some possible long-term side effects?  What are possible complications?  What support services are available?  When should I contact my cancer care provider?  What number can I call with questions or concerns? Where to find support Cancer affects the entire family. Find out what family support resources are available from your cancer treatment center. For more support, turn to:  Your cancer care team.  Friends and family.  Your religious community.  Other people with cancer.  Online support groups. Where to find more information  [place]: www.cancer.gov  [employer]: www.cancer.org Contact a health care provider if:  You bleed or bruise often.  You have: ? A skin rash or dry or itchy skin. ? A headache or stiff neck. ? Cold or flu symptoms. ? A cough. ? Nausea or  vomiting. ? Diarrhea. ? Frequent urination, burning when passing urine, or foul-smelling urine. ? Blood in your urine or stool.  You cannot eat because of mouth or throat pain.  You are sad, confused, anxious, or depressed. Get help right away if:  You have: ? A fever. ? Redness, swelling, pain, fluid, or warmth near an IV site. ? Bleeding that you cannot stop. ? A seizure.  You cannot swallow.  You have chest pain.  You have trouble breathing. Summary  Chemotherapy is a treatment that uses medicine to kill cancer cells. Chemotherapy causes side effects. The specific side effects depend on the specific medicines used.  Learn as much as you can about your condition. Ask about side effects to watch for and how to treat them.  Seek out support and resources from others. Find out what family support resources are available from your cancer treatment center.  Let your health care provider know if you notice any new or unusual symptoms. This information is not intended to replace advice given to you by your health care provider. Make sure you discuss any questions you have with your health care provider. Document Revised: 01/21/2018 Document Reviewed: 01/21/2018 Elsevier Patient Education  2020 [employer].  Preparing for Chemotherapy, Adult Chemotherapy  is a way to treat cancer. It uses medicines to slow down or stop the growth of cancer. Depending on the type and stage of your cancer, you may have chemotherapy to:  Cure the cancer.  Prevent the cancer from growing or spreading (metastasizing).  Ease symptoms and improve your quality of life (palliative care).  Improve the effects of radiation treatment.  Shrink a tumor before surgery.  Rid your body of cancer cells that remain after a tumor is surgically removed. How is chemotherapy given? Chemotherapy may be given in many ways. Some common ways include:  As a pill or capsule.  By a shot (injection).  As a skin  (topical) cream.  As a wafer that is put in your body where the cancer is. The wafer has chemotherapy medicine in it.  As an injection into the cerebrospinal fluid (CSF) in the brain or spinal cord (intraventricular or intrathecal chemotherapy).  Through a small, thin tube (catheter). There are different kinds of catheters. You might have one that: ? Goes into a vein (intravenous catheter). An IV may be inserted into a vein each time you get a treatment. ? Connects to a port that is inserted under the skin of your chest. A port catheter connects the port to a large vein in your chest or upper arm. A needle is inserted through your skin into the port during each treatment. The port may be kept in place for many weeks or months. ? Goes into a vein near your elbow (PICC line). It may be used for weeks or months. ? Goes into a vein near your neck that leads to your heart (non-tunneled catheter). This catheter has a risk of infection, so it is used for only a short time. ? Goes through the skin of your chest and into a large vein that leads to your heart (tunneled catheter). It can be kept in your body for months or years. The way that you will get chemotherapy medicines depends on your condition. What can I do to prepare for the treatment? Do these things so you feel ready for your treatment:  Find out as much as you can about your treatment. Ask your health care provider for trusted sources (Internet sites, books, videos, and people) to help you learn about the treatment that you will be having.  Talk with your health care provider about all medicines, vitamins, and supplements that you take. Some vitamins and supplements should not be taken during chemotherapy because they may interfere with the treatment.  Ask your health care provider what to expect. Write down any questions you have and keep track of the answers.  If you work, ask how the treatment will affect your ability to do your job. If  needed, meet with your manager to discuss a modified work schedule or time away from work during your chemotherapy treatment.  Meet with a Education officer, museum or patient navigator to discuss any financial concerns you have about the cost of your chemotherapy medicine. Ask about resources such as patient assistance programs and grants to help with other related costs.  Learn about eating well during treatment. Chemotherapy can change what you want to eat and how your body feels. Choose nutritious foods and drinks that taste good to you. It might help to talk to a nutrition specialist (dietitian) about what to eat to stay healthy.  If you are concerned about how the chemotherapy treatment may affect your ability to have children (fertility), ask to meet with a  specialist prior to starting treatment to discuss ways to preserve your fertility.  You might lose some hair because of chemotherapy. Decide how you want to manage this. Find a wig, scarf, or hat that you like. Think about shaving your head before treatment starts.  Go to your dentist for a checkup before treatment starts.  If you have a port, a PICC line, or a long-term catheter, learn how to take care of it.  Pack things to bring with you to the treatment center, like a snack, movies, music, books, or a smartphone or tablet. What questions should I ask my health care provider? Ask your health care provider these questions:  Which kind of chemotherapy would be best for me? Why?  How often will I have treatment?  How does the treatment work?  Does the clinic or hospital have support groups for adults with cancer?  Can I do all my usual activities? What activities are off-limits?  What are the most common side effects?  Are there any long-lasting or permanent side effects?  Will this treatment affect my fertility?  Whom do I call if I have problems after a treatment? Summary  Chemotherapy is a way to treat cancer. It uses medicines  to slow down or stop the growth of cancer.  The goal of chemotherapy depends on the type and stage of your cancer.  Chemotherapy may be given in many ways. The most common ways are by IV, by a shot (injection), or in a pill form.  Find out as much as you can about your treatment. Ask your health care provider for trusted sources (Internet sites, books, videos, and people) to help you learn about the treatment you will be having.  Before you start chemotherapy, ask your health care provider any questions you have about your treatment. This information is not intended to replace advice given to you by your health care provider. Make sure you discuss any questions you have with your health care provider. Document Revised: 10/09/2017 Document Reviewed: 07/10/2017 Elsevier Patient Education  2020 [employer].  Chemotherapy Chemotherapy is a cancer treatment. It uses medicines to slow down or stop the growth of cancer. You may have chemotherapy to:  Cure your cancer.  Prevent the cancer from growing or spreading (metastasizing).  Ease symptoms and improve your quality of life (palliative care).  Improve the effects of radiation treatment.  Shrink a tumor before surgery.  Rid the body of cancer cells that remain after having a tumor surgically removed. The length of chemotherapy treatment depends on many factors, including:  The type and stage of your cancer.  How you respond to the chemotherapy.  Your side effects. What are the risks? Generally, this is a safe treatment. However, problems may occur, including:  Infection.  Bleeding at the IV site.  Allergic reactions to medicines. You may have side effects from chemotherapy. What side effects you have depends on a variety of factors, including:  The type of chemotherapy medicine used.  Your dosage.  How long the medicine is used for.  Your overall health. What happens before treatment?  You will meet with your cancer  care team to discuss: ? How your chemotherapy medicine will be given. ? Common side effects and how to manage them. ? Your treatment schedule.  You may have blood tests.  You may be given medicine to help prevent common side effects. What happens during treatment? Chemotherapy may be given continuously over time, or it may be given in  cycles. Some common ways chemotherapy may be given include:  As a pill or capsule.  As an injection.  As a skin (topical) cream.  As a special wafer that is put in your body where the cancer is.  As an injection into the cerebrospinal fluid (CSF) in the brain or spinal cord (intraventricular or intrathecal chemotherapy).  Through a small, thin tube (catheter). There are different kinds of catheters. You might have one that: ? Goes into a vein (intravenous catheter). ? Connects to a device (port) that is inserted under the skin of your chest (port catheter). A port catheter connects the port to a large vein in your chest or upper arm. The port may stay in place for many weeks or months. ? Goes into a vein near your elbow (PICC line). This may be used for weeks or months. ? Goes into a vein in your neck that leads to your heart (non-tunneled catheter). This catheter has a risk of infection, so it is used for only a short time. ? Goes through the skin of your chest and into a large vein that leads to your heart (tunneled catheter). This catheter can stay in your body for months or years. While you are receiving your medicine, your cancer care team will monitor your blood pressure, heart rate, breathing rate, and blood oxygen level (vital signs) and watch for any problems. Some types of chemotherapy medicine are given only one time. Others are given for months, years, or for life. What can I expect after treatment? After chemotherapy, you may have side effects, such as:  Nausea and vomiting.  Appetite loss.  Constipation or  diarrhea.  Fatigue.  Increased risk of infections, bruising, or bleeding.  Hair loss.  Mouth or throat sores.  Tingling, pain, or numbness in the hands and feet.  Dry, sensitive, itchy, or sore skin.  Memory changes. Follow these instructions at home: General instructions   If you get chemotherapy through an IV, PICC line, or port, check the site every day for signs of infection. Check for redness, swelling, pain, fluid, or warmth.  Wash your hands frequently with soap and water. If soap and water are not available, use hand sanitizer. Have other members of your household wash their hands often.  Chemotherapy medicines leave the body through urine or stool (feces), but they can also be present in other body fluids including vomit, tears, vaginal fluids, and semen. You must carefully follow some safety precautions to prevent harm to others while you are taking these medicines: ? Wash any clothes, towels, and linens that may have your bodily fluids on them twice in a washing machine. ? Use a condom during vaginal, anal, and oral sex while you are taking chemotherapy medicines and for 48 hours after your last dose. ? Practice good bathroom hygiene:  Always sit when using the toilet. Close the toilet seat lid before you flush.  Wash your hands thoroughly with soap and water after each time you use the toilet.  Keep all follow-up visits as told by your cancer care team. This is important. Eating and drinking  Talk with a dietitian about what you should eat and drink during cancer treatment.  Always wash fresh fruits and vegetables well before eating them.  Drink enough fluid to keep urine pale yellow. Medicines  Take over-the-counter and prescription medicines only as told by your health care provider.  Talk with your health care provider before taking vitamins and supplements. Some can interfere with chemotherapy. Activity  Get plenty of rest.  Get regular exercise such  as walking, gentle yoga, or tai chi.  Return to your normal activities as told by your health care provider. Ask your health care provider what activities are safe for you. Contact a health care provider if:  You have: ? A skin rash that does not go away. ? A headache. ? A stiff neck. ? A cough. ? Cold or flu symptoms. ? A burning feeling when urinating. ? Urine that smells bad. ? Diarrhea. ? Nausea. ? Vomiting. ? Blood in your urine or stool.  You bleed or bruise often.  You urinate more frequently than usual.  You cannot eat because of mouth or throat pain. Get help right away if:  You have: ? A fever. ? Redness, swelling, pain, fluid, or warmth near your IV site. ? Bleeding that does not stop. ? A seizure. ? Chest pain. ? Difficulty breathing.  You cannot swallow. Summary  Chemotherapy is a way to treat cancer. It uses medicines to slow down or stop the growth of cancer.  Before treatment, you and your cancer care team will discuss common side effects and how to manage them.  The way that you will get chemotherapy medicines depends on your condition.  Take over-the-counter and prescription medicines only as told by your health care provider. This information is not intended to replace advice given to you by your health care provider. Make sure you discuss any questions you have with your health care provider. Document Revised: 02/16/2019 Document Reviewed: 08/13/2017 Elsevier Patient Education  [place].

## 2020-09-07 ENCOUNTER — Other Ambulatory Visit: Payer: Self-pay | Admitting: Hematology and Oncology

## 2020-09-07 LAB — COMPREHENSIVE METABOLIC PANEL
Albumin: 3.2 — AB (ref 3.5–5.0)
Calcium: 8.7 (ref 8.7–10.7)

## 2020-09-07 LAB — BASIC METABOLIC PANEL
BUN: 15 (ref 4–21)
CO2: 24 — AB (ref 13–22)
Chloride: 102 (ref 99–108)
Creatinine: 0.9 (ref 0.6–1.3)
Glucose: 109
Potassium: 4.2 (ref 3.4–5.3)
Sodium: 138 (ref 137–147)

## 2020-09-07 LAB — CBC AND DIFFERENTIAL
HCT: 29 — AB (ref 41–53)
Hemoglobin: 9.1 — AB (ref 13.5–17.5)
Neutrophils Absolute: 5.6
Platelets: 433 — AB (ref 150–399)
WBC: 8

## 2020-09-07 LAB — HEPATIC FUNCTION PANEL
ALT: 28 (ref 10–40)
AST: 48 — AB (ref 14–40)
Alkaline Phosphatase: 494 — AB (ref 25–125)
Bilirubin, Total: 0.5

## 2020-09-07 LAB — CBC: RBC: 3.36 — AB (ref 3.87–5.11)

## 2020-09-10 ENCOUNTER — Other Ambulatory Visit: Payer: Self-pay

## 2020-09-10 ENCOUNTER — Inpatient Hospital Stay: Payer: Medicare HMO | Attending: Oncology

## 2020-09-10 VITALS — BP 106/61 | HR 107 | Temp 97.9°F | Resp 18 | Ht 74.0 in | Wt 250.1 lb

## 2020-09-10 DIAGNOSIS — C787 Secondary malignant neoplasm of liver and intrahepatic bile duct: Secondary | ICD-10-CM | POA: Diagnosis not present

## 2020-09-10 DIAGNOSIS — C180 Malignant neoplasm of cecum: Secondary | ICD-10-CM | POA: Diagnosis not present

## 2020-09-10 DIAGNOSIS — Z79899 Other long term (current) drug therapy: Secondary | ICD-10-CM | POA: Diagnosis not present

## 2020-09-10 DIAGNOSIS — Z7982 Long term (current) use of aspirin: Secondary | ICD-10-CM | POA: Diagnosis not present

## 2020-09-10 DIAGNOSIS — R5383 Other fatigue: Secondary | ICD-10-CM | POA: Insufficient documentation

## 2020-09-10 DIAGNOSIS — Z5111 Encounter for antineoplastic chemotherapy: Secondary | ICD-10-CM | POA: Insufficient documentation

## 2020-09-10 DIAGNOSIS — R634 Abnormal weight loss: Secondary | ICD-10-CM | POA: Diagnosis not present

## 2020-09-10 DIAGNOSIS — D509 Iron deficiency anemia, unspecified: Secondary | ICD-10-CM | POA: Diagnosis not present

## 2020-09-10 DIAGNOSIS — C20 Malignant neoplasm of rectum: Secondary | ICD-10-CM

## 2020-09-10 DIAGNOSIS — N281 Cyst of kidney, acquired: Secondary | ICD-10-CM | POA: Diagnosis not present

## 2020-09-10 MED ORDER — PALONOSETRON HCL INJECTION 0.25 MG/5ML
0.2500 mg | Freq: Once | INTRAVENOUS | Status: AC
  Administered 2020-09-10: 0.25 mg via INTRAVENOUS

## 2020-09-10 MED ORDER — DEXTROSE 5 % IV SOLN
Freq: Once | INTRAVENOUS | Status: AC
  Filled 2020-09-10: qty 250

## 2020-09-10 MED ORDER — OXALIPLATIN CHEMO INJECTION 100 MG/20ML
76.5000 mg/m2 | Freq: Once | INTRAVENOUS | Status: AC
  Administered 2020-09-10: 185 mg via INTRAVENOUS
  Filled 2020-09-10: qty 37

## 2020-09-10 MED ORDER — SODIUM CHLORIDE 0.9% FLUSH
10.0000 mL | INTRAVENOUS | Status: DC | PRN
  Filled 2020-09-10: qty 10

## 2020-09-10 MED ORDER — SODIUM CHLORIDE 0.9 % IV SOLN
10.0000 mg | Freq: Once | INTRAVENOUS | Status: AC
  Administered 2020-09-10: 10 mg via INTRAVENOUS
  Filled 2020-09-10: qty 10

## 2020-09-10 MED ORDER — HEPARIN SOD (PORK) LOCK FLUSH 100 UNIT/ML IV SOLN
500.0000 [IU] | Freq: Once | INTRAVENOUS | Status: DC | PRN
  Filled 2020-09-10: qty 5

## 2020-09-10 MED ORDER — FLUOROURACIL CHEMO INJECTION 2.5 GM/50ML
360.0000 mg/m2 | Freq: Once | INTRAVENOUS | Status: AC
  Administered 2020-09-10: 850 mg via INTRAVENOUS
  Filled 2020-09-10: qty 17

## 2020-09-10 MED ORDER — LEUCOVORIN CALCIUM INJECTION 350 MG
360.0000 mg/m2 | Freq: Once | INTRAVENOUS | Status: AC
  Administered 2020-09-10: 864 mg via INTRAVENOUS
  Filled 2020-09-10: qty 43.2

## 2020-09-10 MED ORDER — SODIUM CHLORIDE 0.9 % IV SOLN
2160.0000 mg/m2 | INTRAVENOUS | Status: DC
  Administered 2020-09-10: 5200 mg via INTRAVENOUS
  Filled 2020-09-10: qty 104

## 2020-09-10 MED ORDER — PALONOSETRON HCL INJECTION 0.25 MG/5ML
INTRAVENOUS | Status: AC
  Filled 2020-09-10: qty 5

## 2020-09-10 NOTE — Patient Instructions (Signed)
Fluorouracil, 5-FU injection What is this medicine? FLUOROURACIL, 5-FU (flure oh YOOR a sil) is a chemotherapy drug. It slows the growth of cancer cells. This medicine is used to treat many types of cancer like breast cancer, colon or rectal cancer, pancreatic cancer, and stomach cancer. This medicine may be used for other purposes; ask your health care provider or pharmacist if you have questions. COMMON BRAND NAME(S): Adrucil What should I tell my health care provider before I take this medicine? They need to know if you have any of these conditions:  blood disorders  dihydropyrimidine dehydrogenase (DPD) deficiency  infection (especially a virus infection such as chickenpox, cold sores, or herpes)  kidney disease  liver disease  malnourished, poor nutrition  recent or ongoing radiation therapy  an unusual or allergic reaction to fluorouracil, other chemotherapy, other medicines, foods, dyes, or preservatives  pregnant or trying to get pregnant  breast-feeding How should I use this medicine? This drug is given as an infusion or injection into a vein. It is administered in a hospital or clinic by a specially trained health care professional. Talk to your pediatrician regarding the use of this medicine in children. Special care may be needed. Overdosage: If you think you have taken too much of this medicine contact a poison control center or emergency room at once. NOTE: This medicine is only for you. Do not share this medicine with others. What if I miss a dose? It is important not to miss your dose. Call your doctor or health care professional if you are unable to keep an appointment. What may interact with this medicine?  allopurinol  cimetidine  dapsone  digoxin  hydroxyurea  leucovorin  levamisole  medicines for seizures like ethotoin, fosphenytoin, phenytoin  medicines to increase blood counts like filgrastim, pegfilgrastim, sargramostim  medicines that  treat or prevent blood clots like warfarin, enoxaparin, and dalteparin  methotrexate  metronidazole  pyrimethamine  some other chemotherapy drugs like busulfan, cisplatin, estramustine, vinblastine  trimethoprim  trimetrexate  vaccines Talk to your doctor or health care professional before taking any of these medicines:  acetaminophen  aspirin  ibuprofen  ketoprofen  naproxen This list may not describe all possible interactions. Give your health care provider a list of all the medicines, herbs, non-prescription drugs, or dietary supplements you use. Also tell them if you smoke, drink alcohol, or use illegal drugs. Some items may interact with your medicine. What should I watch for while using this medicine? Visit your doctor for checks on your progress. This drug may make you feel generally unwell. This is not uncommon, as chemotherapy can affect healthy cells as well as cancer cells. Report any side effects. Continue your course of treatment even though you feel ill unless your doctor tells you to stop. In some cases, you may be given additional medicines to help with side effects. Follow all directions for their use. Call your doctor or health care professional for advice if you get a fever, chills or sore throat, or other symptoms of a cold or flu. Do not treat yourself. This drug decreases your body's ability to fight infections. Try to avoid being around people who are sick. This medicine may increase your risk to bruise or bleed. Call your doctor or health care professional if you notice any unusual bleeding. Be careful brushing and flossing your teeth or using a toothpick because you may get an infection or bleed more easily. If you have any dental work done, tell your dentist you are  receiving this medicine. Avoid taking products that contain aspirin, acetaminophen, ibuprofen, naproxen, or ketoprofen unless instructed by your doctor. These medicines may hide a fever. Do not  become pregnant while taking this medicine. Women should inform their doctor if they wish to become pregnant or think they might be pregnant. There is a potential for serious side effects to an unborn child. Talk to your health care professional or pharmacist for more information. Do not breast-feed an infant while taking this medicine. Men should inform their doctor if they wish to father a child. This medicine may lower sperm counts. Do not treat diarrhea with over the counter products. Contact your doctor if you have diarrhea that lasts more than 2 days or if it is severe and watery. This medicine can make you more sensitive to the sun. Keep out of the sun. If you cannot avoid being in the sun, wear protective clothing and use sunscreen. Do not use sun lamps or tanning beds/booths. What side effects may I notice from receiving this medicine? Side effects that you should report to your doctor or health care professional as soon as possible:  allergic reactions like skin rash, itching or hives, swelling of the face, lips, or tongue  low blood counts - this medicine may decrease the number of white blood cells, red blood cells and platelets. You may be at increased risk for infections and bleeding.  signs of infection - fever or chills, cough, sore throat, pain or difficulty passing urine  signs of decreased platelets or bleeding - bruising, pinpoint red spots on the skin, black, tarry stools, blood in the urine  signs of decreased red blood cells - unusually weak or tired, fainting spells, lightheadedness  breathing problems  changes in vision  chest pain  mouth sores  nausea and vomiting  pain, swelling, redness at site where injected  pain, tingling, numbness in the hands or feet  redness, swelling, or sores on hands or feet  stomach pain  unusual bleeding Side effects that usually do not require medical attention (report to your doctor or health care professional if they  continue or are bothersome):  changes in finger or toe nails  diarrhea  dry or itchy skin  hair loss  headache  loss of appetite  sensitivity of eyes to the light  stomach upset  unusually teary eyes This list may not describe all possible side effects. Call your doctor for medical advice about side effects. You may report side effects to FDA at 1-800-FDA-1088. Where should I keep my medicine? This drug is given in a hospital or clinic and will not be stored at home. NOTE: This sheet is a summary. It may not cover all possible information. If you have questions about this medicine, talk to your doctor, pharmacist, or health care provider.  2020 [employer] Standard (2008-03-01 13:53:16) Leucovorin injection What is this medicine? LEUCOVORIN (loo koe VOR in) is used to prevent or treat the harmful effects of some medicines. This medicine is used to treat anemia caused by a low amount of folic acid in the body. It is also used with 5-fluorouracil (5-FU) to treat colon cancer. This medicine may be used for other purposes; ask your health care provider or pharmacist if you have questions. What should I tell my health care provider before I take this medicine? They need to know if you have any of these conditions:  anemia from low levels of vitamin B-12 in the blood  an unusual or allergic reaction to  leucovorin, folic acid, other medicines, foods, dyes, or preservatives  pregnant or trying to get pregnant  breast-feeding How should I use this medicine? This medicine is for injection into a muscle or into a vein. It is given by a health care professional in a hospital or clinic setting. Talk to your pediatrician regarding the use of this medicine in children. Special care may be needed. Overdosage: If you think you have taken too much of this medicine contact a poison control center or emergency room at once. NOTE: This medicine is only for you. Do not share this medicine with  others. What if I miss a dose? This does not apply. What may interact with this medicine?  capecitabine  fluorouracil  phenobarbital  phenytoin  primidone  trimethoprim-sulfamethoxazole This list may not describe all possible interactions. Give your health care provider a list of all the medicines, herbs, non-prescription drugs, or dietary supplements you use. Also tell them if you smoke, drink alcohol, or use illegal drugs. Some items may interact with your medicine. What should I watch for while using this medicine? Your condition will be monitored carefully while you are receiving this medicine. This medicine may increase the side effects of 5-fluorouracil, 5-FU. Tell your doctor or health care professional if you have diarrhea or mouth sores that do not get better or that get worse. What side effects may I notice from receiving this medicine? Side effects that you should report to your doctor or health care professional as soon as possible:  allergic reactions like skin rash, itching or hives, swelling of the face, lips, or tongue  breathing problems  fever, infection  mouth sores  unusual bleeding or bruising  unusually weak or tired Side effects that usually do not require medical attention (report to your doctor or health care professional if they continue or are bothersome):  constipation or diarrhea  loss of appetite  nausea, vomiting This list may not describe all possible side effects. Call your doctor for medical advice about side effects. You may report side effects to FDA at 1-800-FDA-1088. Where should I keep my medicine? This drug is given in a hospital or clinic and will not be stored at home. NOTE: This sheet is a summary. It may not cover all possible information. If you have questions about this medicine, talk to your doctor, pharmacist, or health care provider.  2020 [employer] Standard (2008-05-02 16:50:29) Oxaliplatin Injection What is this  medicine? OXALIPLATIN (ox AL i PLA tin) is a chemotherapy drug. It targets fast dividing cells, like cancer cells, and causes these cells to die. This medicine is used to treat cancers of the colon and rectum, and many other cancers. This medicine may be used for other purposes; ask your health care provider or pharmacist if you have questions. COMMON BRAND NAME(S): Eloxatin What should I tell my health care provider before I take this medicine? They need to know if you have any of these conditions:  heart disease  history of irregular heartbeat  liver disease  low blood counts, like white cells, platelets, or red blood cells  lung or breathing disease, like asthma  take medicines that treat or prevent blood clots  tingling of the fingers or toes, or other nerve disorder  an unusual or allergic reaction to oxaliplatin, other chemotherapy, other medicines, foods, dyes, or preservatives  pregnant or trying to get pregnant  breast-feeding How should I use this medicine? This drug is given as an infusion into a vein. It is administered  in a hospital or clinic by a specially trained health care professional. Talk to your pediatrician regarding the use of this medicine in children. Special care may be needed. Overdosage: If you think you have taken too much of this medicine contact a poison control center or emergency room at once. NOTE: This medicine is only for you. Do not share this medicine with others. What if I miss a dose? It is important not to miss a dose. Call your doctor or health care professional if you are unable to keep an appointment. What may interact with this medicine? Do not take this medicine with any of the following medications:  cisapride  dronedarone  pimozide  thioridazine This medicine may also interact with the following medications:  aspirin and aspirin-like medicines  certain medicines that treat or prevent blood clots like warfarin, apixaban,  dabigatran, and rivaroxaban  cisplatin  cyclosporine  diuretics  medicines for infection like acyclovir, adefovir, amphotericin B, bacitracin, cidofovir, foscarnet, ganciclovir, gentamicin, pentamidine, vancomycin  NSAIDs, medicines for pain and inflammation, like ibuprofen or naproxen  other medicines that prolong the QT interval (an abnormal heart rhythm)  pamidronate  zoledronic acid This list may not describe all possible interactions. Give your health care provider a list of all the medicines, herbs, non-prescription drugs, or dietary supplements you use. Also tell them if you smoke, drink alcohol, or use illegal drugs. Some items may interact with your medicine. What should I watch for while using this medicine? Your condition will be monitored carefully while you are receiving this medicine. You may need blood work done while you are taking this medicine. This medicine may make you feel generally unwell. This is not uncommon as chemotherapy can affect healthy cells as well as cancer cells. Report any side effects. Continue your course of treatment even though you feel ill unless your healthcare professional tells you to stop. This medicine can make you more sensitive to cold. Do not drink cold drinks or use ice. Cover exposed skin before coming in contact with cold temperatures or cold objects. When out in cold weather wear warm clothing and cover your mouth and nose to warm the air that goes into your lungs. Tell your doctor if you get sensitive to the cold. Do not become pregnant while taking this medicine or for 9 months after stopping it. Women should inform their health care professional if they wish to become pregnant or think they might be pregnant. Men should not father a child while taking this medicine and for 6 months after stopping it. There is potential for serious side effects to an unborn child. Talk to your health care professional for more information. Do not  breast-feed a child while taking this medicine or for 3 months after stopping it. This medicine has caused ovarian failure in some women. This medicine may make it more difficult to get pregnant. Talk to your health care professional if you are concerned about your fertility. This medicine has caused decreased sperm counts in some men. This may make it more difficult to father a child. Talk to your health care professional if you are concerned about your fertility. This medicine may increase your risk of getting an infection. Call your health care professional for advice if you get a fever, chills, or sore throat, or other symptoms of a cold or flu. Do not treat yourself. Try to avoid being around people who are sick. Avoid taking medicines that contain aspirin, acetaminophen, ibuprofen, naproxen, or ketoprofen unless instructed by  your health care professional. These medicines may hide a fever. Be careful brushing or flossing your teeth or using a toothpick because you may get an infection or bleed more easily. If you have any dental work done, tell your dentist you are receiving this medicine. What side effects may I notice from receiving this medicine? Side effects that you should report to your doctor or health care professional as soon as possible:  allergic reactions like skin rash, itching or hives, swelling of the face, lips, or tongue  breathing problems  cough  low blood counts - this medicine may decrease the number of white blood cells, red blood cells, and platelets. You may be at increased risk for infections and bleeding  nausea, vomiting  pain, redness, or irritation at site where injected  pain, tingling, numbness in the hands or feet  signs and symptoms of bleeding such as bloody or black, tarry stools; red or dark brown urine; spitting up blood or brown material that looks like coffee grounds; red spots on the skin; unusual bruising or bleeding from the eyes, gums, or  nose  signs and symptoms of a dangerous change in heartbeat or heart rhythm like chest pain; dizziness; fast, irregular heartbeat; palpitations; feeling faint or lightheaded; falls  signs and symptoms of infection like fever; chills; cough; sore throat; pain or trouble passing urine  signs and symptoms of liver injury like dark yellow or brown urine; general ill feeling or flu-like symptoms; light-colored stools; loss of appetite; nausea; right upper belly pain; unusually weak or tired; yellowing of the eyes or skin  signs and symptoms of low red blood cells or anemia such as unusually weak or tired; feeling faint or lightheaded; falls  signs and symptoms of muscle injury like dark urine; trouble passing urine or change in the amount of urine; unusually weak or tired; muscle pain; back pain Side effects that usually do not require medical attention (report to your doctor or health care professional if they continue or are bothersome):  changes in taste  diarrhea  gas  hair loss  loss of appetite  mouth sores This list may not describe all possible side effects. Call your doctor for medical advice about side effects. You may report side effects to FDA at 1-800-FDA-1088. Where should I keep my medicine? This drug is given in a hospital or clinic and will not be stored at home. NOTE: This sheet is a summary. It may not cover all possible information. If you have questions about this medicine, talk to your doctor, pharmacist, or health care provider.  2020 [employer] Standard (2019-03-16 12:20:35)

## 2020-09-10 NOTE — Progress Notes (Signed)
PT STABLE AT TIME OF DISCHARGE 

## 2020-09-11 ENCOUNTER — Telehealth: Payer: Self-pay

## 2020-09-12 ENCOUNTER — Other Ambulatory Visit: Payer: Self-pay

## 2020-09-12 ENCOUNTER — Inpatient Hospital Stay: Payer: Medicare HMO

## 2020-09-12 VITALS — BP 105/68 | HR 82 | Temp 97.9°F | Resp 18 | Ht 74.0 in | Wt 253.0 lb

## 2020-09-12 DIAGNOSIS — C20 Malignant neoplasm of rectum: Secondary | ICD-10-CM

## 2020-09-12 DIAGNOSIS — Z5111 Encounter for antineoplastic chemotherapy: Secondary | ICD-10-CM | POA: Diagnosis not present

## 2020-09-12 DIAGNOSIS — C787 Secondary malignant neoplasm of liver and intrahepatic bile duct: Secondary | ICD-10-CM

## 2020-09-12 MED ORDER — SODIUM CHLORIDE 0.9% FLUSH
10.0000 mL | INTRAVENOUS | Status: DC | PRN
  Administered 2020-09-12: 10 mL
  Filled 2020-09-12: qty 10

## 2020-09-12 MED ORDER — HEPARIN SOD (PORK) LOCK FLUSH 100 UNIT/ML IV SOLN
500.0000 [IU] | Freq: Once | INTRAVENOUS | Status: AC | PRN
  Administered 2020-09-12: 500 [IU]
  Filled 2020-09-12: qty 5

## 2020-09-12 NOTE — Progress Notes (Signed)
Pt stable at time of discharge. 

## 2020-09-12 NOTE — Telephone Encounter (Signed)
I spoke with pt this am. He is doing well, no complaints.

## 2020-09-18 ENCOUNTER — Other Ambulatory Visit: Payer: Self-pay | Admitting: Oncology

## 2020-09-18 DIAGNOSIS — C20 Malignant neoplasm of rectum: Secondary | ICD-10-CM

## 2020-09-18 DIAGNOSIS — C787 Secondary malignant neoplasm of liver and intrahepatic bile duct: Secondary | ICD-10-CM

## 2020-09-18 NOTE — Progress Notes (Signed)
[facility]  [address] [place],  [place]  [address] [phone]  Clinic Day:  09/19/2020  Referring physician: Raina Mina., MD   This document serves as a record of services personally performed by Hosie Poisson, MD. It was created on their behalf by [facility], a trained medical scribe. The creation of this record is based on the scribe's personal observations and the provider's statements to them.   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   Current Treatment:  FOLFOX   HISTORY OF PRESENT ILLNESS:  Ralph Griffin is a 79 y.o. 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  He was referred from the emergency department for the evaluation and treatment of adenocarcinoma of the cecum.  He presented to the [facility] emergency department on September 24th due to abdominal discomfort, melena, hematochezia, anorexia and weight loss.  He had been started on Eliquis the week prior by his primary care physician for atrial fibrillation.  However, in 3-4 days the patient started having black stool and bright red blood per rectum.  This medication was held as advised by his primary physician and he was advised to present to the ER.  While in the ER he was started on Protonix drip due to concern for GI bleed and GI services was consulted.  CTA revealed findings most consistent with metastatic colorectal carcinoma with innumerable hypoattenuating masses throughout the liver, the largest measuring 4.8 cm, and both regional and distal nodal metastatic disease.  There was also a growing exophytic nodule arising from the right kidney measuring 2.7 cm, previously 2.2 cm, concerning for renal cell carcinoma.  There was no evidence of active arterial GI bleeding.  He underwent EGD and total colonoscopy with biopsy on September  25th which revealed several large nonobstructing malignant appearing masses in the cecum.  Surgical pathology from this procedure confirmed adenocarcinoma with necrosis.  Iron studies were notable for iron deficiency with an iron level of 12.0 with a TIBC of 242 for a percent saturation of 4.9%.  The patient was given IV Feraheme and started on oral supplement 325 mg BID.  He was discharged on September 26th, and lab work at that time was unremarkable except for a hemoglobin of 8.3, and a calcium of 8.2.     INTERVAL HISTORY:  Ralph Griffin is here for follow up after starting FOLFOX chemotherapy on October 13th.  He states that he tolerated this without difficulty, and couldn't even tell he received it.  He states that he has been fairly well.  He continues gabapentin 100 mg TID.  His hemoglobin has mildly decreased from 9.1 to 8.9, his platelet count has improved from 433,000 to 350,000, and his white count is normal.  Chemistries are unremarkable.  Blood glucose is 170.  His  appetite is poor, and he has lost nearly 8 pounds since his last visit.  He has been supplementing with protein shakes once daily, and I advised that he increase this to two to three times daily.  We will also try him on Remaron 7.5 mg at bedtime to try and improve his appetite.  He denies fever, chills or other signs of infection.  He denies nausea, vomiting, bowel issues, or abdominal pain.  He denies sore throat, cough, dyspnea, or chest pain. His son accompanies him today.   REVIEW OF SYSTEMS:  Review of Systems  Constitutional:  Positive for appetite change (poor).  All other systems reviewed and are negative.    VITALS:  Blood pressure (!) 102/55, pulse 86, temperature 97.7 F (36.5 C), temperature source Oral, resp. rate 18, height 5' 10.5" (1.791 m), weight 247 lb 4.8 oz (112.2 kg), SpO2 94 %.  Wt Readings from Last 3 Encounters:  09/19/20 247 lb 4.8 oz (112.2 kg)  09/17/20 255 lb 1 oz (115.7 kg)  09/12/20 253 lb (114.8  kg)    Body mass index is 34.98 kg/m.  Performance status (ECOG): 1 - Symptomatic but completely ambulatory  PHYSICAL EXAM:  Physical Exam Constitutional:      General: He is not in acute distress.    Appearance: Normal appearance. He is normal weight.  HENT:     Head: Normocephalic and atraumatic.  Eyes:     General: No scleral icterus.    Extraocular Movements: Extraocular movements intact.     Conjunctiva/sclera: Conjunctivae normal.     Pupils: Pupils are equal, round, and reactive to light.  Cardiovascular:     Rate and Rhythm: Normal rate and regular rhythm.     Pulses: Normal pulses.     Heart sounds: Normal heart sounds. No murmur heard.  No friction rub. No gallop.   Pulmonary:     Effort: Pulmonary effort is normal. No respiratory distress.     Breath sounds: Normal breath sounds.  Abdominal:     General: Bowel sounds are normal.     Palpations: Abdomen is soft. There is no mass.     Tenderness: There is no abdominal tenderness.  Musculoskeletal:        General: Normal range of motion.     Cervical back: Neck supple.     Right lower leg: No edema.     Left lower leg: No edema.  Lymphadenopathy:     Cervical: No cervical adenopathy.  Skin:    General: Skin is warm and dry.  Neurological:     General: No focal deficit present.     Mental Status: He is alert and oriented to person, place, and time. Mental status is at baseline.  Psychiatric:        Mood and Affect: Mood normal.        Behavior: Behavior normal.        Thought Content: Thought content normal.        Judgment: Judgment normal.     LABS:   His hemoglobin has mildly decreased from 9.1 to 8.9, his platelet count has improved from 433,000 to 350,000, and his white count is normal.  Chemistries are unremarkable.  Blood glucose is 170.  CBC Latest Ref Rng & Units 09/19/2020 09/07/2020 08/17/2020  WBC - 6.3 8.0 10.1  Hemoglobin 13.5 - 17.5 8.9(A) 9.1(A) 10.3(A)  Hematocrit 41 - 53 28(A) 29(A)  32(A)  Platelets 150 - 399 350 433(A) 446(A)   CMP Latest Ref Rng & Units 09/19/2020 09/07/2020 08/17/2020  Glucose 70 - 99 mg/dL - - -  BUN 4 - 21 19 15 17   Creatinine 0.6 - 1.3 1.0 0.9 0.9  Sodium 137 - 147 137 138 137  Potassium 3.4 - 5.3 4.6 4.2 4.2  Chloride 99 - 108 105 102 103  CO2 13 - 22 22 24(A) 21  Calcium 8.7 - 10.7 8.9 8.7 9.2  Total Protein 6.5 - 8.1 g/dL - - -  Total Bilirubin 0.3 - 1.2 mg/dL - - -  Alkaline Phos 25 - 125 462(A) 494(A) 436(A)  AST 14 -  40 42(A) 48(A) 39  ALT 10 - 40 27 28 28      No results found for: CEA1 / No results found for: CEA1    STUDIES:  No results found.   Allergies: No Known Allergies  Current Medications: Current Outpatient Medications  Medication Sig Dispense Refill  . allopurinol (ZYLOPRIM) 100 MG tablet Take 100 mg by mouth daily.    Marland Kitchen aspirin EC 81 MG tablet Take 81 mg by mouth daily.    Marland Kitchen atorvastatin (LIPITOR) 20 MG tablet Take by mouth.    . Blood Glucose Monitoring Suppl (GLUCOCOM BLOOD GLUCOSE MONITOR) DEVI Check blood sugars once a day    . Blood Glucose Monitoring Suppl (ONETOUCH VERIO FLEX SYSTEM) w/Device KIT CHECK BLOOD SUGARS ONCE A DAY    . carvedilol (COREG) 12.5 MG tablet Take by mouth.    . cholecalciferol (VITAMIN D3) 25 MCG (1000 UT) tablet Take 2,000 Units by mouth daily.    Marland Kitchen escitalopram (LEXAPRO) 10 MG tablet Take 10 mg by mouth daily.    . ferrous sulfate 325 (65 FE) MG tablet Take 325 mg by mouth 2 (two) times daily with a meal.    . gabapentin (NEURONTIN) 100 MG capsule Take by mouth.    Marland Kitchen glipiZIDE (GLUCOTROL) 10 MG tablet Take 1 tablet in morning and 1/2 tablet in evening.    Marland Kitchen glucose blood (ONETOUCH VERIO) test strip Check blood sugars once a day    . HYDROcodone-acetaminophen (NORCO/VICODIN) 5-325 MG tablet Take 1 tablet by mouth 3 (three) times daily as needed.    . Insulin Pen Needle (B-D UF III MINI PEN NEEDLES) 31G X 5 MM MISC Use as directed with victoza pens    . metFORMIN (GLUCOPHAGE-XR)  500 MG 24 hr tablet Take 1,000 mg by mouth 2 (two) times daily.    . Multiple Vitamins-Minerals (CENTRUM SILVER 50+MEN PO) Take 1 tablet by mouth daily.    Marland Kitchen omeprazole (PRILOSEC) 20 MG capsule Take 20 mg by mouth daily.    . ondansetron (ZOFRAN) 4 MG tablet Take 4 mg by mouth every 8 (eight) hours as needed for nausea or vomiting.    Glory Rosebush Delica Lancets 38V MISC CHECK BLOOD SUGAR EVERY DAY AS DIRECTED.    Marland Kitchen ONETOUCH VERIO test strip daily.    Dellia Nims CALCIUM + D3 500-200 MG-UNIT TABS Take 1 tablet by mouth every morning.    . prochlorperazine (COMPAZINE) 10 MG tablet Take 10 mg by mouth every 6 (six) hours as needed for nausea or vomiting.    Marland Kitchen rOPINIRole (REQUIP) 3 MG tablet Take by mouth.    . Semaglutide,0.25 or 0.5MG/DOS, (OZEMPIC, 0.25 OR 0.5 MG/DOSE,) 2 MG/1.5ML SOPN Inject 1 mg into the skin once a week.    . vitamin B-12 (CYANOCOBALAMIN) 1000 MCG tablet Take 1,000 mcg by mouth daily.    . mirtazapine (REMERON) 7.5 MG tablet Take 1 tablet (7.5 mg total) by mouth at bedtime. 30 tablet 5   No current facility-administered medications for this visit.     ASSESSMENT & PLAN:   Assessment:   1.  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  We do not advise surgical resection or radiation.  His only option would be chemotherapy and we discussed this today. MSI and MMR were normal, but he was positive for mutations of KRAS and PIK3CA.  He has regional and distal nodal metastatic disease.  2.  Innumerable hypoattenuating masses throughout the liver consistent with metastatic  disease.  There is little doubt that this is metastatic disease, so we will not pursue biopsy.  3.  Iron deficiency anemia for which he is on oral iron supplement twice daily.  He will continue this regimen.  The drop in his hemoglobin is likely from his chemotherapy but we will want to recheck his iron studies later to see if he needs additional IV Feraheme.  I  am not sure that he is absorbing the oral adequately.  4.  Exophytic nodule arising from the right kidney measuring 2.7 cm.  This has mildly enlarged since prior study, but has been present for the past 3 years.  We will continue to monitor.  5.  Complicated pain from issues with his spine.  He went to the pain clinic and they have him on hydrocodone.  6.  Weight loss secondary to malignancy.  He has continued to lose weight, and so I will add in Remeron 7.5 mg at bedtime.  His insurance would not cover Marinol.  Plan: He started treatment with FOLFOX on October 13th, and tolerated this without difficulty.   He will proceed with a 2nd cyle on November 15th.  As he has continued to lose weight, I will try him on Remeron 7.5 mg at bedtime to improve his appetite.  We will see him back on November 29th with CBC, CMP and CEA prior to a 3rd cycle on November 30th.  He and his son understand and agree with this plan of care.  I have answered their questions and they know to call with any concerns.     I provided 30 minutes (9:30 AM - 10:00 AM) of face-to-face time during this this encounter and > 50% was spent counseling as documented under my assessment and plan.    Derwood Kaplan, MD [facility] [address] [place] [address] Dept: [phone] Dept Fax: [phone]   I, Rita Ohara, am acting as scribe for Derwood Kaplan, MD  I have reviewed this report as typed by the medical scribe, and it is complete and accurate.

## 2020-09-19 ENCOUNTER — Other Ambulatory Visit: Payer: Self-pay

## 2020-09-19 ENCOUNTER — Other Ambulatory Visit: Payer: Self-pay | Admitting: Oncology

## 2020-09-19 ENCOUNTER — Inpatient Hospital Stay (INDEPENDENT_AMBULATORY_CARE_PROVIDER_SITE_OTHER): Payer: Medicare HMO | Admitting: Oncology

## 2020-09-19 ENCOUNTER — Encounter: Payer: Self-pay | Admitting: Oncology

## 2020-09-19 ENCOUNTER — Inpatient Hospital Stay: Payer: Medicare HMO | Admitting: Hematology and Oncology

## 2020-09-19 VITALS — BP 102/55 | HR 86 | Temp 97.7°F | Resp 18 | Ht 70.5 in | Wt 247.3 lb

## 2020-09-19 DIAGNOSIS — C787 Secondary malignant neoplasm of liver and intrahepatic bile duct: Secondary | ICD-10-CM

## 2020-09-19 DIAGNOSIS — C20 Malignant neoplasm of rectum: Secondary | ICD-10-CM

## 2020-09-19 DIAGNOSIS — Z5111 Encounter for antineoplastic chemotherapy: Secondary | ICD-10-CM | POA: Diagnosis not present

## 2020-09-19 LAB — HEPATIC FUNCTION PANEL
ALT: 27 (ref 10–40)
AST: 42 — AB (ref 14–40)
Alkaline Phosphatase: 462 — AB (ref 25–125)
Bilirubin, Total: 0.4

## 2020-09-19 LAB — CBC AND DIFFERENTIAL
HCT: 28 — AB (ref 41–53)
Hemoglobin: 8.9 — AB (ref 13.5–17.5)
Neutrophils Absolute: 4.54
Platelets: 350 (ref 150–399)
WBC: 6.3

## 2020-09-19 LAB — BASIC METABOLIC PANEL
BUN: 19 (ref 4–21)
CO2: 22 (ref 13–22)
Chloride: 105 (ref 99–108)
Creatinine: 1 (ref 0.6–1.3)
Glucose: 170
Potassium: 4.6 (ref 3.4–5.3)
Sodium: 137 (ref 137–147)

## 2020-09-19 LAB — COMPREHENSIVE METABOLIC PANEL
Albumin: 3.4 — AB (ref 3.5–5.0)
Calcium: 8.9 (ref 8.7–10.7)

## 2020-09-19 LAB — CBC: RBC: 3.28 — AB (ref 3.87–5.11)

## 2020-09-19 LAB — TOTAL PROTEIN, URINE DIPSTICK: Protein, ur: NEGATIVE mg/dL

## 2020-09-19 MED ORDER — MIRTAZAPINE 7.5 MG PO TABS: 7.5000 mg | ORAL_TABLET | Freq: Every day | ORAL | 5 refills | Status: DC

## 2020-09-20 ENCOUNTER — Telehealth: Payer: Self-pay | Admitting: Oncology

## 2020-09-20 NOTE — Telephone Encounter (Signed)
Scheduled 11/10 LOS Appt's.  Requested patient to be given updated Appt Calendar at next visit

## 2020-09-24 ENCOUNTER — Inpatient Hospital Stay: Payer: Medicare HMO

## 2020-09-24 ENCOUNTER — Other Ambulatory Visit: Payer: Self-pay

## 2020-09-24 VITALS — BP 102/55 | HR 79 | Temp 98.1°F | Resp 20 | Ht 70.5 in | Wt 246.8 lb

## 2020-09-24 DIAGNOSIS — Z5111 Encounter for antineoplastic chemotherapy: Secondary | ICD-10-CM | POA: Diagnosis not present

## 2020-09-24 DIAGNOSIS — C20 Malignant neoplasm of rectum: Secondary | ICD-10-CM

## 2020-09-24 DIAGNOSIS — C787 Secondary malignant neoplasm of liver and intrahepatic bile duct: Secondary | ICD-10-CM

## 2020-09-24 MED ORDER — PALONOSETRON HCL INJECTION 0.25 MG/5ML
0.2500 mg | Freq: Once | INTRAVENOUS | Status: AC
  Administered 2020-09-24: 0.25 mg via INTRAVENOUS

## 2020-09-24 MED ORDER — PALONOSETRON HCL INJECTION 0.25 MG/5ML
INTRAVENOUS | Status: AC
  Filled 2020-09-24: qty 5

## 2020-09-24 MED ORDER — DEXTROSE 5 % IV SOLN
Freq: Once | INTRAVENOUS | Status: AC
  Filled 2020-09-24: qty 250

## 2020-09-24 MED ORDER — SODIUM CHLORIDE 0.9 % IV SOLN
5.0000 mg/kg | Freq: Once | INTRAVENOUS | Status: AC
  Administered 2020-09-24: 600 mg via INTRAVENOUS
  Filled 2020-09-24: qty 16

## 2020-09-24 MED ORDER — LEUCOVORIN CALCIUM INJECTION 350 MG
360.0000 mg/m2 | Freq: Once | INTRAVENOUS | Status: AC
  Administered 2020-09-24: 864 mg via INTRAVENOUS
  Filled 2020-09-24: qty 43.2

## 2020-09-24 MED ORDER — SODIUM CHLORIDE 0.9 % IV SOLN
2160.0000 mg/m2 | INTRAVENOUS | Status: DC
  Administered 2020-09-24: 5200 mg via INTRAVENOUS
  Filled 2020-09-24: qty 104

## 2020-09-24 MED ORDER — OXALIPLATIN CHEMO INJECTION 100 MG/20ML
76.5000 mg/m2 | Freq: Once | INTRAVENOUS | Status: AC
  Administered 2020-09-24: 185 mg via INTRAVENOUS
  Filled 2020-09-24: qty 37

## 2020-09-24 MED ORDER — SODIUM CHLORIDE 0.9 % IV SOLN
10.0000 mg | Freq: Once | INTRAVENOUS | Status: AC
  Administered 2020-09-24: 10 mg via INTRAVENOUS
  Filled 2020-09-24: qty 10

## 2020-09-24 MED ORDER — FLUOROURACIL CHEMO INJECTION 2.5 GM/50ML
360.0000 mg/m2 | Freq: Once | INTRAVENOUS | Status: AC
  Administered 2020-09-24: 850 mg via INTRAVENOUS
  Filled 2020-09-24: qty 17

## 2020-09-24 MED ORDER — HEPARIN SOD (PORK) LOCK FLUSH 100 UNIT/ML IV SOLN
500.0000 [IU] | Freq: Once | INTRAVENOUS | Status: DC | PRN
  Filled 2020-09-24: qty 5

## 2020-09-24 NOTE — Patient Instructions (Signed)
[facility] Discharge Instructions for Patients Receiving Chemotherapy  Today you received the following chemotherapy agents Bevacizumab, Fluorouracil, Leucovorin, Oxaliplatin  To help prevent nausea and vomiting after your treatment, we encourage you to take your nausea medication.   If you develop nausea and vomiting that is not controlled by your nausea medication, call the clinic.   BELOW ARE SYMPTOMS THAT SHOULD BE REPORTED IMMEDIATELY:  *FEVER GREATER THAN 100.5 F  *CHILLS WITH OR WITHOUT FEVER  NAUSEA AND VOMITING THAT IS NOT CONTROLLED WITH YOUR NAUSEA MEDICATION  *UNUSUAL SHORTNESS OF BREATH  *UNUSUAL BRUISING OR BLEEDING  TENDERNESS IN MOUTH AND THROAT WITH OR WITHOUT PRESENCE OF ULCERS  *URINARY PROBLEMS  *BOWEL PROBLEMS  UNUSUAL RASH Items with * indicate a potential emergency and should be followed up as soon as possible.  Feel free to call the clinic should you have any questions or concerns at The clinic phone number is [phone].  Please show the [place] at check-in to the Emergency Department and triage nurse.

## 2020-09-24 NOTE — Progress Notes (Signed)
Pt stable at time of discharge. 

## 2020-09-25 ENCOUNTER — Other Ambulatory Visit: Payer: Self-pay | Admitting: Hematology and Oncology

## 2020-09-25 DIAGNOSIS — C180 Malignant neoplasm of cecum: Secondary | ICD-10-CM

## 2020-09-26 ENCOUNTER — Inpatient Hospital Stay: Payer: Medicare HMO

## 2020-09-26 ENCOUNTER — Other Ambulatory Visit: Payer: Self-pay

## 2020-09-26 VITALS — BP 109/74 | HR 100 | Temp 97.4°F | Resp 20 | Ht 70.5 in | Wt 245.8 lb

## 2020-09-26 DIAGNOSIS — Z5111 Encounter for antineoplastic chemotherapy: Secondary | ICD-10-CM | POA: Diagnosis not present

## 2020-09-26 DIAGNOSIS — C20 Malignant neoplasm of rectum: Secondary | ICD-10-CM

## 2020-09-26 MED ORDER — SODIUM CHLORIDE 0.9% FLUSH
10.0000 mL | INTRAVENOUS | Status: DC | PRN
  Administered 2020-09-26: 10 mL
  Filled 2020-09-26: qty 10

## 2020-09-26 MED ORDER — HEPARIN SOD (PORK) LOCK FLUSH 100 UNIT/ML IV SOLN
500.0000 [IU] | Freq: Once | INTRAVENOUS | Status: AC | PRN
  Administered 2020-09-26: 500 [IU]
  Filled 2020-09-26: qty 5

## 2020-09-26 NOTE — Patient Instructions (Signed)
Pt tolerated chemotherapy well. Pt had no complaints of  Nausea or vomiting or diarrhea. Encouraged pt to drink more Glucerna. The patient stated that he tries to do that. The patient's appetite has been down some.

## 2020-09-26 NOTE — Progress Notes (Signed)
PT STABLE AT TIME OF DISCHARGE 

## 2020-10-02 NOTE — Progress Notes (Signed)
[facility]  [address] 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   Current Treatment:  FOLFOX/bevacizumab.   HISTORY OF PRESENT ILLNESS:  Ralph Griffin is a 79 y.o. 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  He was referred from the emergency department for the evaluation and treatment of adenocarcinoma of the cecum.  He presented to the [facility] emergency department on September 24th due to abdominal discomfort, melena, hematochezia, anorexia and weight loss.  He had been started on Eliquis the week prior by his primary care physician for atrial fibrillation.  However, in 3-4 days the patient started having black stool and bright red blood per rectum.  This medication was held as advised by his primary physician and he was advised to present to the ER.  While in the ER he was started on Protonix drip due to concern for GI bleed and GI services was consulted.  CTA revealed findings most consistent with metastatic colorectal carcinoma with innumerable hypoattenuating masses throughout the liver, the largest measuring 4.8 cm, and both regional and distal nodal metastatic disease.  There was also a growing exophytic nodule arising from the right kidney measuring 2.7 cm, previously 2.2 cm, concerning for renal cell carcinoma.  There was no evidence of active arterial GI bleeding.  He underwent EGD and total colonoscopy with biopsy on September 25th which revealed several large nonobstructing malignant appearing masses in the cecum.  Surgical pathology from this procedure confirmed adenocarcinoma with necrosis.  Iron studies were notable for iron deficiency with an iron level of 12.0 with a TIBC of 242 for a  percent saturation of 4.9%.  The patient was given IV Feraheme and started on oral supplement 325 mg BID.  He was discharged on September 26th, and lab work at that time was unremarkable except for a hemoglobin of 8.3, and a calcium of 8.2.  He had his first cycle of FOLFOX/bevacizumab on October 13 and tolerated this fairly well.  As he has continued to lose weight, he was placed on Remeron 7.5 mg at bedtime to improve his appetite.  INTERVAL HISTORY:  Ralph Griffin is here for a prior to third cycle of FOLFOX/bevacizumab and states his appetite remains decreased, despite starting mirtazapine 7.5 mg at bedtime.  He reports mild fatigue, but denies other complaints.  In general, he states he does not feel any different on chemotherapy than prior to chemotherapy except for his decreased appetite. He does not mind losing some weight. He is accompanied by his wife, Ralph Griffin, today  REVIEW OF SYSTEMS:  Review of Systems  Constitutional: Positive for appetite change, fatigue and unexpected weight change. Negative for chills and fever.  HENT:   Negative for lump/mass, mouth sores and sore throat.   Respiratory: Negative for cough and shortness of breath.   Cardiovascular: Negative for chest pain and leg swelling.  Gastrointestinal: Negative for abdominal pain, constipation, diarrhea, nausea and vomiting.  Genitourinary: Negative for difficulty urinating, dysuria, frequency and hematuria.   Musculoskeletal: Negative for arthralgias, back pain and myalgias.  Skin: Negative for rash.  Neurological: Negative for dizziness and headaches.  Psychiatric/Behavioral: Negative for depression. The patient is not nervous/anxious.      VITALS:  Vital signs include a weight of 243 lb, this  is a decrease of 3 lb, blood pressure 126/81, pulse 103, respirations, 18 and  Temperature97.7. Pulse oximetry 98%.  Wt Readings from Last 3 Encounters:  10/03/20 243 lb 9.6 oz (110.5 kg)  09/26/20 245 lb 12 oz (111.5 kg)  09/24/20 246  lb 12 oz (111.9 kg)    There is no height or weight on file to calculate BMI.  Performance status (ECOG): 1 - Symptomatic but completely ambulatory  PHYSICAL EXAM:  Physical Exam Constitutional:      General: He is not in acute distress.    Appearance: Normal appearance.  HENT:     Mouth/Throat:     Mouth: Mucous membranes are moist.     Pharynx: Oropharynx is clear.  Eyes:     General: No scleral icterus.    Extraocular Movements: Extraocular movements intact.     Conjunctiva/sclera: Conjunctivae normal.     Pupils: Pupils are equal, round, and reactive to light.  Cardiovascular:     Rate and Rhythm: Normal rate and regular rhythm.     Heart sounds: Normal heart sounds. No murmur heard.  No friction rub. No gallop.   Pulmonary:     Effort: No respiratory distress.     Breath sounds: Normal breath sounds. No stridor. No wheezing, rhonchi or rales.  Abdominal:     General: There is no distension.     Palpations: Abdomen is soft. There is no hepatomegaly, splenomegaly or mass.     Tenderness: There is no abdominal tenderness. There is no guarding.     Hernia: No hernia is present.  Musculoskeletal:     Cervical back: Neck supple. No tenderness.  Neurological:     Mental Status: He is alert.   Lymph nodes:   There is no cervical, clavicular, axillary or inguinal lymphadenopathy.  LABS:    CBC Latest Ref Rng & Units 10/03/2020 09/19/2020 09/07/2020  WBC - 5.2 6.3 8.0  Hemoglobin 13.5 - 17.5 9.1(A) 8.9(A) 9.1(A)  Hematocrit 41 - 53 28(A) 28(A) 29(A)  Platelets 150 - 399 393 350 433(A)   CMP Latest Ref Rng & Units 10/03/2020 09/19/2020 09/07/2020  Glucose 70 - 99 mg/dL - - -  BUN 4 - _0 Creatinine 0.6 - 1.3 1.0 1.0 0.9  Sodium 137 - 147 139 137 138  Potassium 3.4 - 5.3 4.3 4.6 4.2  Chloride 99 - 108 106 105 102  CO2 13 - 22 24(A) 22 24(A)  Calcium 8.7 - 10.7 9.0 8.9 8.7  Total Protein 6.5 - 8.1 g/dL - - -  Total Bilirubin 0.3 - 1.2 mg/dL - - -  Alkaline  Phos 25 - 125 400(A) 462(A) 494(A)  AST 14 - 40 41(A) 42(A) 48(A)  ALT 10 - 40 _1 No results found for: CEA1 / No results found for: CEA1    STUDIES:  No results found.   Allergies: No Known Allergies  Current Medications: Current Outpatient Medications  Medication Sig Dispense Refill  . allopurinol (ZYLOPRIM) 100 MG tablet Take 100 mg by mouth daily.    Marland Kitchen aspirin EC 81 MG tablet Take 81 mg by mouth daily.    Marland Kitchen atorvastatin (LIPITOR) 20 MG tablet Take by mouth.    . Blood Glucose Monitoring Suppl (GLUCOCOM BLOOD GLUCOSE MONITOR) DEVI Check blood sugars once a day    . Blood Glucose Monitoring Suppl (ONETOUCH VERIO FLEX SYSTEM) w/Device KIT CHECK BLOOD SUGARS ONCE A DAY    . carvedilol (COREG) 12.5  MG tablet Take by mouth.    . cholecalciferol (VITAMIN D3) 25 MCG (1000 UT) tablet Take 2,000 Units by mouth daily.    Marland Kitchen escitalopram (LEXAPRO) 10 MG tablet Take 10 mg by mouth daily.    . ferrous sulfate 325 (65 FE) MG tablet Take 325 mg by mouth 2 (two) times daily with a meal.    . gabapentin (NEURONTIN) 100 MG capsule Take by mouth.    Marland Kitchen glipiZIDE (GLUCOTROL) 10 MG tablet Take 1 tablet in morning and 1/2 tablet in evening.    Marland Kitchen glucose blood (ONETOUCH VERIO) test strip Check blood sugars once a day    . HYDROcodone-acetaminophen (NORCO/VICODIN) 5-325 MG tablet Take 1 tablet by mouth 3 (three) times daily as needed.    . Insulin Pen Needle (B-D UF III MINI PEN NEEDLES) 31G X 5 MM MISC Use as directed with victoza pens    . metFORMIN (GLUCOPHAGE-XR) 500 MG 24 hr tablet Take 1,000 mg by mouth 2 (two) times daily.    . mirtazapine (REMERON) 7.5 MG tablet Take 1 tablet (7.5 mg total) by mouth at bedtime. 30 tablet 5  . Multiple Vitamins-Minerals (CENTRUM SILVER 50+MEN PO) Take 1 tablet by mouth daily.    Marland Kitchen omeprazole (PRILOSEC) 20 MG capsule Take 20 mg by mouth daily.    . ondansetron (ZOFRAN) 4 MG tablet Take 4 mg by mouth every 8 (eight) hours as needed for nausea or vomiting.     Glory Rosebush Delica Lancets 34V MISC CHECK BLOOD SUGAR EVERY DAY AS DIRECTED.    Marland Kitchen ONETOUCH VERIO test strip daily.    Dellia Nims CALCIUM + D3 500-200 MG-UNIT TABS Take 1 tablet by mouth every morning.    . prochlorperazine (COMPAZINE) 10 MG tablet Take 10 mg by mouth every 6 (six) hours as needed for nausea or vomiting.    Marland Kitchen rOPINIRole (REQUIP) 3 MG tablet Take by mouth.    . Semaglutide,0.25 or 0.5MG/DOS, (OZEMPIC, 0.25 OR 0.5 MG/DOSE,) 2 MG/1.5ML SOPN Inject 1 mg into the skin once a week.    . vitamin B-12 (CYANOCOBALAMIN) 1000 MCG tablet Take 1,000 mcg by mouth daily.     No current facility-administered medications for this visit.     ASSESSMENT & PLAN:   Assessment:   1. 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  We do not advise surgical resection or radiation.  MSI and MMR were normal, but he was positive for mutations of KRAS and PIK3CA.  He has regional and distal nodal metastatic disease.  He is receiving palliative FOLFOX/bevicizumab and tolerating this well. 2. Innumerable hypoattenuating masses throughout the liver consistent with metastatic disease.  There is little doubt that this is metastatic disease, so we will not pursue biopsy. 3. Iron deficiency anemia for which he is on oral iron supplement twice daily.  He will continue this regimen. He has persistent anemia despite this, yet his MCV is well within normal limits, so I will add B12 and folate today. 4. Exophytic nodule arising from the right kidney measuring 2.7 cm.  This has mildly enlarged since prior study, but has been present for the past 3 years.  We will continue to monitor this. 5. Complicated pain from issues with his spine, which he sees pain management.  He is currently on hydrocodone. 6. Weight loss secondary to malignancy, despite starting mirtazapine 7.5 mg daily. After discussing with Dr. Hinton Rao, we will increase this to 15 mg at  bedtime.  Plan:  Overall, he is doing quite well.  As above, we will increase the mirtazapine to 15 mg ( i.e. two 7.5 mg tablets ) at bedtime.  If he does not tolerate that, he will contact us. We will proceed with a 3rd cycle on November 29th.  We will plan to see him back on December 10th with a CBC and comprehensive metabolic panel prior to a 4th cycle palliative FOLFOX/bevacizumab on December 13th.  We will plan to repeat imaging after a 6th cycle.  The patient and his wife understand the plans discussed today and are in agreement with them.  They know to contact our office if he develops issues during immediate clinical assessment.   Marvia Pickles, PA-C [facility] AT [facility] [address] [place] [address] Dept: [phone] Dept Fax: [phone]   I, Rita Ohara, am acting as scribe for Derwood Kaplan, MD  I have reviewed this report as typed by the medical scribe, and it is complete and accurate.

## 2020-10-03 ENCOUNTER — Inpatient Hospital Stay: Payer: Medicare HMO

## 2020-10-03 ENCOUNTER — Encounter: Payer: Self-pay | Admitting: Hematology and Oncology

## 2020-10-03 ENCOUNTER — Other Ambulatory Visit: Payer: Self-pay

## 2020-10-03 ENCOUNTER — Inpatient Hospital Stay (INDEPENDENT_AMBULATORY_CARE_PROVIDER_SITE_OTHER): Payer: Medicare HMO | Admitting: Hematology and Oncology

## 2020-10-03 DIAGNOSIS — D649 Anemia, unspecified: Secondary | ICD-10-CM | POA: Diagnosis not present

## 2020-10-03 DIAGNOSIS — C20 Malignant neoplasm of rectum: Secondary | ICD-10-CM | POA: Diagnosis not present

## 2020-10-03 DIAGNOSIS — C787 Secondary malignant neoplasm of liver and intrahepatic bile duct: Secondary | ICD-10-CM | POA: Diagnosis not present

## 2020-10-03 LAB — BASIC METABOLIC PANEL
BUN: 19 (ref 4–21)
CO2: 24 — AB (ref 13–22)
Chloride: 106 (ref 99–108)
Creatinine: 1 (ref 0.6–1.3)
Glucose: 170
Potassium: 4.3 (ref 3.4–5.3)
Sodium: 139 (ref 137–147)

## 2020-10-03 LAB — CBC AND DIFFERENTIAL
HCT: 28 — AB (ref 41–53)
Hemoglobin: 9.1 — AB (ref 13.5–17.5)
Neutrophils Absolute: 3.43
Platelets: 393 (ref 150–399)
WBC: 5.2

## 2020-10-03 LAB — CBC: RBC: 3.29 — AB (ref 3.87–5.11)

## 2020-10-03 LAB — HEPATIC FUNCTION PANEL
ALT: 24 (ref 10–40)
AST: 41 — AB (ref 14–40)
Alkaline Phosphatase: 400 — AB (ref 25–125)
Bilirubin, Total: 0.5

## 2020-10-03 LAB — COMPREHENSIVE METABOLIC PANEL
Albumin: 3.5 (ref 3.5–5.0)
Calcium: 9 (ref 8.7–10.7)

## 2020-10-08 ENCOUNTER — Inpatient Hospital Stay: Payer: Medicare HMO

## 2020-10-08 ENCOUNTER — Other Ambulatory Visit: Payer: Self-pay

## 2020-10-08 VITALS — BP 107/70 | HR 92 | Temp 97.6°F | Resp 18 | Ht 70.5 in | Wt 244.8 lb

## 2020-10-08 DIAGNOSIS — Z5111 Encounter for antineoplastic chemotherapy: Secondary | ICD-10-CM | POA: Diagnosis not present

## 2020-10-08 DIAGNOSIS — C787 Secondary malignant neoplasm of liver and intrahepatic bile duct: Secondary | ICD-10-CM

## 2020-10-08 MED ORDER — PALONOSETRON HCL INJECTION 0.25 MG/5ML
INTRAVENOUS | Status: AC
  Filled 2020-10-08: qty 5

## 2020-10-08 MED ORDER — PALONOSETRON HCL INJECTION 0.25 MG/5ML
0.2500 mg | Freq: Once | INTRAVENOUS | Status: AC
  Administered 2020-10-08: 0.25 mg via INTRAVENOUS

## 2020-10-08 MED ORDER — SODIUM CHLORIDE 0.9 % IV SOLN
Freq: Once | INTRAVENOUS | Status: DC
  Filled 2020-10-08: qty 250

## 2020-10-08 MED ORDER — SODIUM CHLORIDE 0.9 % IV SOLN
5.0000 mg/kg | Freq: Once | INTRAVENOUS | Status: AC
  Administered 2020-10-08: 600 mg via INTRAVENOUS
  Filled 2020-10-08: qty 16

## 2020-10-08 MED ORDER — SODIUM CHLORIDE 0.9 % IV SOLN
10.0000 mg | Freq: Once | INTRAVENOUS | Status: AC
  Administered 2020-10-08: 10 mg via INTRAVENOUS
  Filled 2020-10-08: qty 1

## 2020-10-08 MED ORDER — HEPARIN SOD (PORK) LOCK FLUSH 100 UNIT/ML IV SOLN
500.0000 [IU] | Freq: Once | INTRAVENOUS | Status: DC | PRN
  Filled 2020-10-08: qty 5

## 2020-10-08 MED ORDER — OXALIPLATIN CHEMO INJECTION 100 MG/20ML
76.5000 mg/m2 | Freq: Once | INTRAVENOUS | Status: AC
  Administered 2020-10-08: 185 mg via INTRAVENOUS
  Filled 2020-10-08: qty 37

## 2020-10-08 MED ORDER — FLUOROURACIL CHEMO INJECTION 2.5 GM/50ML
360.0000 mg/m2 | Freq: Once | INTRAVENOUS | Status: AC
  Administered 2020-10-08: 850 mg via INTRAVENOUS
  Filled 2020-10-08: qty 17

## 2020-10-08 MED ORDER — DEXTROSE 5 % IV SOLN
Freq: Once | INTRAVENOUS | Status: AC
  Filled 2020-10-08: qty 250

## 2020-10-08 MED ORDER — LEUCOVORIN CALCIUM INJECTION 100 MG
50.0000 mg | Freq: Once | INTRAMUSCULAR | Status: AC
  Administered 2020-10-08: 50 mg via INTRAVENOUS
  Filled 2020-10-08: qty 2.5

## 2020-10-08 MED ORDER — SODIUM CHLORIDE 0.9 % IV SOLN
2160.0000 mg/m2 | INTRAVENOUS | Status: DC
  Administered 2020-10-08: 5200 mg via INTRAVENOUS
  Filled 2020-10-08: qty 104

## 2020-10-08 NOTE — Patient Instructions (Signed)
[facility] Discharge Instructions for Patients Receiving Chemotherapy  Today you received the following chemotherapy agents Oxaliplatin,Leucovorin,Fluorouracil,Bevacizumab  To help prevent nausea and vomiting after your treatment, we encourage you to take your nausea medication as directed.   If you develop nausea and vomiting that is not controlled by your nausea medication, call the clinic.   BELOW ARE SYMPTOMS THAT SHOULD BE REPORTED IMMEDIATELY:  *FEVER GREATER THAN 100.5 F  *CHILLS WITH OR WITHOUT FEVER  NAUSEA AND VOMITING THAT IS NOT CONTROLLED WITH YOUR NAUSEA MEDICATION  *UNUSUAL SHORTNESS OF BREATH  *UNUSUAL BRUISING OR BLEEDING  TENDERNESS IN MOUTH AND THROAT WITH OR WITHOUT PRESENCE OF ULCERS  *URINARY PROBLEMS  *BOWEL PROBLEMS  UNUSUAL RASH Items with * indicate a potential emergency and should be followed up as soon as possible.  Feel free to call the clinic should you have any questions or concerns at The clinic phone number is [phone].  Please show the CHEMO ALERT CARD at check-in to the Emergency Department and triage nurse.   

## 2020-10-08 NOTE — Progress Notes (Signed)
PT STABLE AT TIME OF DISCHARGE 

## 2020-10-10 ENCOUNTER — Inpatient Hospital Stay: Payer: Medicare HMO | Attending: Oncology

## 2020-10-10 ENCOUNTER — Other Ambulatory Visit: Payer: Self-pay

## 2020-10-10 VITALS — BP 125/57 | HR 90 | Temp 97.5°F | Resp 18 | Ht 70.5 in | Wt 246.2 lb

## 2020-10-10 DIAGNOSIS — Z79899 Other long term (current) drug therapy: Secondary | ICD-10-CM | POA: Insufficient documentation

## 2020-10-10 DIAGNOSIS — Z7984 Long term (current) use of oral hypoglycemic drugs: Secondary | ICD-10-CM | POA: Insufficient documentation

## 2020-10-10 DIAGNOSIS — C787 Secondary malignant neoplasm of liver and intrahepatic bile duct: Secondary | ICD-10-CM | POA: Diagnosis not present

## 2020-10-10 DIAGNOSIS — C778 Secondary and unspecified malignant neoplasm of lymph nodes of multiple regions: Secondary | ICD-10-CM | POA: Diagnosis not present

## 2020-10-10 DIAGNOSIS — Z5111 Encounter for antineoplastic chemotherapy: Secondary | ICD-10-CM | POA: Insufficient documentation

## 2020-10-10 DIAGNOSIS — C180 Malignant neoplasm of cecum: Secondary | ICD-10-CM | POA: Insufficient documentation

## 2020-10-10 DIAGNOSIS — M549 Dorsalgia, unspecified: Secondary | ICD-10-CM | POA: Insufficient documentation

## 2020-10-10 DIAGNOSIS — N281 Cyst of kidney, acquired: Secondary | ICD-10-CM | POA: Diagnosis not present

## 2020-10-10 DIAGNOSIS — R197 Diarrhea, unspecified: Secondary | ICD-10-CM | POA: Diagnosis not present

## 2020-10-10 DIAGNOSIS — R200 Anesthesia of skin: Secondary | ICD-10-CM | POA: Insufficient documentation

## 2020-10-10 DIAGNOSIS — D509 Iron deficiency anemia, unspecified: Secondary | ICD-10-CM | POA: Insufficient documentation

## 2020-10-10 DIAGNOSIS — C20 Malignant neoplasm of rectum: Secondary | ICD-10-CM

## 2020-10-10 DIAGNOSIS — R32 Unspecified urinary incontinence: Secondary | ICD-10-CM | POA: Diagnosis not present

## 2020-10-10 MED ORDER — SODIUM CHLORIDE 0.9% FLUSH
10.0000 mL | INTRAVENOUS | Status: DC | PRN
  Administered 2020-10-10: 10 mL
  Filled 2020-10-10: qty 10

## 2020-10-10 MED ORDER — HEPARIN SOD (PORK) LOCK FLUSH 100 UNIT/ML IV SOLN
500.0000 [IU] | Freq: Once | INTRAVENOUS | Status: AC | PRN
  Administered 2020-10-10: 500 [IU]
  Filled 2020-10-10: qty 5

## 2020-10-10 NOTE — Progress Notes (Signed)
Pt stable at time of discharge. 

## 2020-10-18 ENCOUNTER — Other Ambulatory Visit: Payer: Self-pay | Admitting: Oncology

## 2020-10-18 NOTE — Progress Notes (Signed)
[facility]  [address] 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   Current Treatment:  FOLFOX/bevacizumab.   HISTORY OF PRESENT ILLNESS:  Ralph Griffin is a 79 y.o. 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  He was referred from the emergency department for the evaluation and treatment of adenocarcinoma of the cecum.  He presented to the [facility] emergency department on September 24th due to abdominal discomfort, melena, hematochezia, anorexia and weight loss.  He had been started on Eliquis the week prior by his primary care physician for atrial fibrillation.  However, in 3-4 days the patient started having black stool and bright red blood per rectum.  This medication was held as advised by his primary physician and he was advised to present to the ER.  While in the ER he was started on Protonix drip due to concern for GI bleed and GI services was consulted.  CTA revealed findings most consistent with metastatic colorectal carcinoma with innumerable hypoattenuating masses throughout the liver, the largest measuring 4.8 cm, and both regional and distal nodal metastatic disease.  There was also a growing exophytic nodule arising from the right kidney measuring 2.7 cm, previously 2.2 cm, concerning for renal cell carcinoma.  There was no evidence of active arterial GI bleeding.  He underwent EGD and total colonoscopy with biopsy on September 25th which revealed several large nonobstructing malignant appearing masses in the cecum.  Surgical pathology from this procedure confirmed adenocarcinoma with necrosis.  Iron studies were notable for iron deficiency with an iron level of 12.0 with a TIBC of 242 for a  percent saturation of 4.9%.  The patient was given IV Feraheme and started on oral supplement 325 mg BID.  He was discharged on September 26th, and lab work at that time was unremarkable except for a hemoglobin of 8.3, and a calcium of 8.2, and an elevated alkaline phosphatase.  He had his first cycle of FOLFOX/bevacizumab on October 13 and tolerated this fairly well.  As he has continued to lose weight, he was placed on Remeron 7.5 mg at bedtime to improve his appetite.  INTERVAL HISTORY:  He is here for follow up prior to a 4th cycle of FOLFOX/bevacizumab.  The mirtazipine was increased to 15 mg at bedtime.  He states that he has been well, and is tolerating treatment without difficulty.  He continues oral iron supplement TID.  His hemoglobin has increased from 9.1 to 9.6, and his white count and platelets are normal.  Chemistries are unremarkable except for a BUN of 21, and an alkaline phosphatase of 323, down from 400.  His last CEA was 57.2 and down to 25.2 in November.  His  appetite is good, and he has gained 4 pounds since his last visit.  He denies fever, chills or other signs of infection.  He denies nausea, vomiting, bowel issues, or abdominal pain.  He denies sore throat, cough, dyspnea, or chest pain.  REVIEW OF SYSTEMS:  Review of Systems  Constitutional: Negative.   HENT:  Negative.  Negative for mouth sores.   Eyes: Negative.   Respiratory: Negative.   Cardiovascular: Negative.   Gastrointestinal: Negative.   Endocrine: Negative.   Genitourinary: Negative.    Musculoskeletal: Negative.  Skin: Negative.   Neurological: Positive for numbness (chronic and stable).  Hematological: Negative.   Psychiatric/Behavioral: Negative.   All other systems reviewed and are negative.    VITALS:  Vital signs include a weight of 246.9 lb, this is an increase of 4 lb, blood pressure 111/51, pulse 84, respirations, 18 and  Temperature97.9. Pulse oximetry 95%.  Wt Readings from Last 3  Encounters:  10/10/20 246 lb 4 oz (111.7 kg)  10/08/20 244 lb 12 oz (111 kg)  10/03/20 243 lb 9.6 oz (110.5 kg)    There is no height or weight on file to calculate BMI.  Performance status (ECOG): 1 - Symptomatic but completely ambulatory  PHYSICAL EXAM:  Physical Exam Constitutional:      General: He is not in acute distress.    Appearance: Normal appearance. He is normal weight.  HENT:     Head: Normocephalic and atraumatic.  Eyes:     General: No scleral icterus.    Extraocular Movements: Extraocular movements intact.     Conjunctiva/sclera: Conjunctivae normal.     Pupils: Pupils are equal, round, and reactive to light.  Cardiovascular:     Rate and Rhythm: Normal rate and regular rhythm.     Pulses: Normal pulses.     Heart sounds: Normal heart sounds. No murmur heard. No friction rub. No gallop.   Pulmonary:     Effort: Pulmonary effort is normal. No respiratory distress.     Breath sounds: Normal breath sounds.  Abdominal:     General: Bowel sounds are normal. There is no distension.     Palpations: Abdomen is soft. There is no mass.     Tenderness: There is no abdominal tenderness.  Musculoskeletal:        General: Normal range of motion.     Cervical back: Normal range of motion and neck supple.     Right lower leg: Edema (1-2+) present.     Left lower leg: Edema (1-2+) present.  Lymphadenopathy:     Cervical: No cervical adenopathy.  Skin:    General: Skin is warm and dry.  Neurological:     General: No focal deficit present.     Mental Status: He is alert and oriented to person, place, and time. Mental status is at baseline.  Psychiatric:        Mood and Affect: Mood normal.        Behavior: Behavior normal.        Thought Content: Thought content normal.        Judgment: Judgment normal.   Lymph nodes:   There is no cervical, clavicular, axillary or inguinal lymphadenopathy.  LABS:    CBC Latest Ref Rng & Units 10/03/2020 09/19/2020 09/07/2020   WBC - 5.2 6.3 8.0  Hemoglobin 13.5 - 17.5 9.1(A) 8.9(A) 9.1(A)  Hematocrit 41 - 53 28(A) 28(A) 29(A)  Platelets 150 - 399 393 350 433(A)   CMP Latest Ref Rng & Units 10/03/2020 09/19/2020 09/07/2020  Glucose 70 - 99 mg/dL - - -  BUN 4 - 21 19 19 15  Creatinine 0.6 - 1.3 1.0 1.0 0.9  Sodium 137 - 147 139 137 138  Potassium 3.4 - 5.3 4.3 4.6 4.2  Chloride 99 - 108 106 105 102  CO2 13 - 22 24(A) 22 24(A)  Calcium 8.7 - 10.7 9.0 8.9 8.7  Total Protein 6.5 - 8.1 g/dL - - -  Total Bilirubin 0.3 - 1.2 mg/dL - - -  Alkaline Phos 25 - 125   400(A) 462(A) 494(A)  AST 14 - 40 41(A) 42(A) 48(A)  ALT 10 - 40 _0 No results found for: CEA1 / No results found for: CEA1    STUDIES:  No results found.   Allergies: No Known Allergies  Current Medications: Current Outpatient Medications  Medication Sig Dispense Refill  . allopurinol (ZYLOPRIM) 100 MG tablet Take 100 mg by mouth daily.    Marland Kitchen aspirin EC 81 MG tablet Take 81 mg by mouth daily.    Marland Kitchen atorvastatin (LIPITOR) 20 MG tablet Take by mouth.    . Blood Glucose Monitoring Suppl (GLUCOCOM BLOOD GLUCOSE MONITOR) DEVI Check blood sugars once a day    . Blood Glucose Monitoring Suppl (ONETOUCH VERIO FLEX SYSTEM) w/Device KIT CHECK BLOOD SUGARS ONCE A DAY    . carvedilol (COREG) 12.5 MG tablet Take by mouth.    . cholecalciferol (VITAMIN D3) 25 MCG (1000 UT) tablet Take 2,000 Units by mouth daily.    Marland Kitchen escitalopram (LEXAPRO) 10 MG tablet Take 10 mg by mouth daily.    . ferrous sulfate 325 (65 FE) MG tablet Take 325 mg by mouth 2 (two) times daily with a meal.    . gabapentin (NEURONTIN) 100 MG capsule Take by mouth.    Marland Kitchen glipiZIDE (GLUCOTROL) 10 MG tablet Take 1 tablet in morning and 1/2 tablet in evening.    Marland Kitchen glucose blood (ONETOUCH VERIO) test strip Check blood sugars once a day    . HYDROcodone-acetaminophen (NORCO/VICODIN) 5-325 MG tablet Take 1 tablet by mouth 3 (three) times daily as needed.    . Insulin Pen Needle (B-D UF III  MINI PEN NEEDLES) 31G X 5 MM MISC Use as directed with victoza pens    . metFORMIN (GLUCOPHAGE-XR) 500 MG 24 hr tablet Take 1,000 mg by mouth 2 (two) times daily.    . mirtazapine (REMERON) 7.5 MG tablet Take 1 tablet (7.5 mg total) by mouth at bedtime. 30 tablet 5  . Multiple Vitamins-Minerals (CENTRUM SILVER 50+MEN PO) Take 1 tablet by mouth daily.    Marland Kitchen omeprazole (PRILOSEC) 20 MG capsule Take 20 mg by mouth daily.    . ondansetron (ZOFRAN) 4 MG tablet Take 4 mg by mouth every 8 (eight) hours as needed for nausea or vomiting.    Glory Rosebush Delica Lancets 56C MISC CHECK BLOOD SUGAR EVERY DAY AS DIRECTED.    Marland Kitchen ONETOUCH VERIO test strip daily.    Dellia Nims CALCIUM + D3 500-200 MG-UNIT TABS Take 1 tablet by mouth every morning.    . prochlorperazine (COMPAZINE) 10 MG tablet Take 10 mg by mouth every 6 (six) hours as needed for nausea or vomiting.    Marland Kitchen rOPINIRole (REQUIP) 3 MG tablet Take by mouth.    . Semaglutide,0.25 or 0.5MG/DOS, (OZEMPIC, 0.25 OR 0.5 MG/DOSE,) 2 MG/1.5ML SOPN Inject 1 mg into the skin once a week.    . vitamin B-12 (CYANOCOBALAMIN) 1000 MCG tablet Take 1,000 mcg by mouth daily.     No current facility-administered medications for this visit.     ASSESSMENT & PLAN:   Assessment:   1. 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  We do not advise surgical resection or radiation.  MSI and MMR were normal, but he was positive for mutations of KRAS and PIK3CA.  He has regional and distal nodal metastatic disease.  He is receiving palliative FOLFOX/bevicizumab and tolerating this well.  2. Innumerable hypoattenuating masses throughout the  liver consistent with metastatic disease.    3. Iron deficiency anemia for which he is on oral iron supplement three times daily.  He will continue this regimen.  4. Exophytic nodule arising from the right kidney measuring 2.7 cm.  This has mildly enlarged since prior study, but has been  present for the past 3 years.  We will continue to monitor this.  5. Complicated pain from issues with his spine, for which he sees pain management.  He is currently on hydrocodone.  6.  Numbness and paresthesias of his fingers, from prior spinal surgery.  This remains stable.   Plan: Overall, he is doing quite well and his CEA has shown good improvement.  He knows to continue mirtazapine to 15 mg at bedtime.  We will proceed with a 4th cycle on December 13th.  We will plan to see him back on December 23rd with a CBC, comprehensive metabolic panel and CEA prior to a 5th cycle palliative FOLFOX/bevacizumab on December 27th.  We will plan to repeat imaging after a 6th cycle at the end of January.  The patient and his son understand the plans discussed today and are in agreement with them.  They know to contact our office if he develops issues during immediate clinical assessment.   Christine H McCarty, MD [facility] [address] [place] [address] Dept: [phone] Dept Fax: [phone]   I, Lauren Curry, am acting as scribe for Christine H. McCarty, MD  I have reviewed this report as typed by the medical scribe, and it is complete and accurate.  

## 2020-10-19 ENCOUNTER — Inpatient Hospital Stay (INDEPENDENT_AMBULATORY_CARE_PROVIDER_SITE_OTHER): Payer: Medicare HMO | Admitting: Oncology

## 2020-10-19 ENCOUNTER — Other Ambulatory Visit: Payer: Self-pay | Admitting: Oncology

## 2020-10-19 ENCOUNTER — Other Ambulatory Visit: Payer: Medicare HMO

## 2020-10-19 ENCOUNTER — Telehealth: Payer: Self-pay | Admitting: Oncology

## 2020-10-19 ENCOUNTER — Other Ambulatory Visit: Payer: Self-pay | Admitting: Hematology and Oncology

## 2020-10-19 ENCOUNTER — Encounter: Payer: Self-pay | Admitting: Oncology

## 2020-10-19 ENCOUNTER — Inpatient Hospital Stay: Payer: Medicare HMO

## 2020-10-19 ENCOUNTER — Other Ambulatory Visit: Payer: Self-pay | Admitting: Pharmacist

## 2020-10-19 VITALS — BP 111/51 | HR 84 | Temp 97.9°F | Resp 18 | Ht 70.5 in | Wt 246.6 lb

## 2020-10-19 DIAGNOSIS — C20 Malignant neoplasm of rectum: Secondary | ICD-10-CM | POA: Diagnosis not present

## 2020-10-19 DIAGNOSIS — C787 Secondary malignant neoplasm of liver and intrahepatic bile duct: Secondary | ICD-10-CM

## 2020-10-19 LAB — BASIC METABOLIC PANEL
BUN: 21 (ref 4–21)
CO2: 23 — AB (ref 13–22)
Chloride: 103 (ref 99–108)
Creatinine: 0.9 (ref 0.6–1.3)
Glucose: 146
Potassium: 4.1 (ref 3.4–5.3)
Sodium: 137 (ref 137–147)

## 2020-10-19 LAB — CBC AND DIFFERENTIAL
HCT: 30 — AB (ref 41–53)
Hemoglobin: 9.6 — AB (ref 13.5–17.5)
Neutrophils Absolute: 3.1
Platelets: 287 (ref 150–399)
WBC: 4.7

## 2020-10-19 LAB — CBC: RBC: 3.42 — AB (ref 3.87–5.11)

## 2020-10-19 LAB — HEPATIC FUNCTION PANEL
ALT: 24 (ref 10–40)
AST: 39 (ref 14–40)
Alkaline Phosphatase: 323 — AB (ref 25–125)
Bilirubin, Total: 0.4

## 2020-10-19 LAB — COMPREHENSIVE METABOLIC PANEL
Albumin: 3.4 — AB (ref 3.5–5.0)
Calcium: 8.8 (ref 8.7–10.7)

## 2020-10-19 NOTE — Telephone Encounter (Signed)
Per 12/10 los next appt given to patient

## 2020-10-22 ENCOUNTER — Other Ambulatory Visit: Payer: Self-pay

## 2020-10-22 ENCOUNTER — Inpatient Hospital Stay: Payer: Medicare HMO

## 2020-10-22 VITALS — BP 97/57 | HR 85 | Temp 97.8°F | Resp 20 | Ht 70.5 in | Wt 243.8 lb

## 2020-10-22 DIAGNOSIS — C787 Secondary malignant neoplasm of liver and intrahepatic bile duct: Secondary | ICD-10-CM

## 2020-10-22 DIAGNOSIS — Z5111 Encounter for antineoplastic chemotherapy: Secondary | ICD-10-CM | POA: Diagnosis not present

## 2020-10-22 MED ORDER — SODIUM CHLORIDE 0.9 % IV SOLN
5.0000 mg/kg | Freq: Once | INTRAVENOUS | Status: AC
  Administered 2020-10-22: 600 mg via INTRAVENOUS
  Filled 2020-10-22: qty 16

## 2020-10-22 MED ORDER — PALONOSETRON HCL INJECTION 0.25 MG/5ML
0.2500 mg | Freq: Once | INTRAVENOUS | Status: AC
  Administered 2020-10-22: 0.25 mg via INTRAVENOUS

## 2020-10-22 MED ORDER — SODIUM CHLORIDE 0.9 % IV SOLN
2160.0000 mg/m2 | INTRAVENOUS | Status: DC
  Administered 2020-10-22: 5200 mg via INTRAVENOUS
  Filled 2020-10-22: qty 104

## 2020-10-22 MED ORDER — PALONOSETRON HCL INJECTION 0.25 MG/5ML
INTRAVENOUS | Status: AC
  Filled 2020-10-22: qty 5

## 2020-10-22 MED ORDER — SODIUM CHLORIDE 0.9% FLUSH
10.0000 mL | INTRAVENOUS | Status: DC | PRN
  Filled 2020-10-22: qty 10

## 2020-10-22 MED ORDER — OXALIPLATIN CHEMO INJECTION 100 MG/20ML
76.5000 mg/m2 | Freq: Once | INTRAVENOUS | Status: AC
  Administered 2020-10-22: 185 mg via INTRAVENOUS
  Filled 2020-10-22: qty 20

## 2020-10-22 MED ORDER — DEXTROSE 5 % IV SOLN
Freq: Once | INTRAVENOUS | Status: AC
  Filled 2020-10-22: qty 250

## 2020-10-22 MED ORDER — FLUOROURACIL CHEMO INJECTION 2.5 GM/50ML
360.0000 mg/m2 | Freq: Once | INTRAVENOUS | Status: AC
  Administered 2020-10-22: 850 mg via INTRAVENOUS
  Filled 2020-10-22: qty 17

## 2020-10-22 MED ORDER — LEUCOVORIN CALCIUM INJECTION 100 MG
50.0000 mg | Freq: Once | INTRAMUSCULAR | Status: AC
  Administered 2020-10-22: 50 mg via INTRAVENOUS
  Filled 2020-10-22: qty 2.5

## 2020-10-22 MED ORDER — SODIUM CHLORIDE 0.9 % IV SOLN
10.0000 mg | Freq: Once | INTRAVENOUS | Status: AC
  Administered 2020-10-22: 10 mg via INTRAVENOUS
  Filled 2020-10-22: qty 10

## 2020-10-22 NOTE — Progress Notes (Signed)
Pt stable at time of discharge. 

## 2020-10-22 NOTE — Patient Instructions (Signed)
[facility] Discharge Instructions for Patients Receiving Chemotherapy  Today you received the following chemotherapy agents Oxaliplatin, Leucovorin,Fluorouracil  To help prevent nausea and vomiting after your treatment, we encourage you to take your nausea medication as directed.   If you develop nausea and vomiting that is not controlled by your nausea medication, call the clinic.   BELOW ARE SYMPTOMS THAT SHOULD BE REPORTED IMMEDIATELY:  *FEVER GREATER THAN 100.5 F  *CHILLS WITH OR WITHOUT FEVER  NAUSEA AND VOMITING THAT IS NOT CONTROLLED WITH YOUR NAUSEA MEDICATION  *UNUSUAL SHORTNESS OF BREATH  *UNUSUAL BRUISING OR BLEEDING  TENDERNESS IN MOUTH AND THROAT WITH OR WITHOUT PRESENCE OF ULCERS  *URINARY PROBLEMS  *BOWEL PROBLEMS  UNUSUAL RASH Items with * indicate a potential emergency and should be followed up as soon as possible.  Feel free to call the clinic should you have any questions or concerns at The clinic phone number is [phone].  Please show the CHEMO ALERT CARD at check-in to the Emergency Department and triage nurse.   

## 2020-10-24 ENCOUNTER — Inpatient Hospital Stay: Payer: Medicare HMO

## 2020-10-24 ENCOUNTER — Other Ambulatory Visit: Payer: Self-pay

## 2020-10-24 VITALS — BP 109/59 | HR 87 | Temp 97.5°F | Resp 16 | Ht 70.5 in | Wt 242.2 lb

## 2020-10-24 DIAGNOSIS — Z5111 Encounter for antineoplastic chemotherapy: Secondary | ICD-10-CM | POA: Diagnosis not present

## 2020-10-24 DIAGNOSIS — C20 Malignant neoplasm of rectum: Secondary | ICD-10-CM

## 2020-10-24 MED ORDER — SODIUM CHLORIDE 0.9% FLUSH
10.0000 mL | INTRAVENOUS | Status: DC | PRN
  Administered 2020-10-24: 10 mL
  Filled 2020-10-24: qty 10

## 2020-10-24 MED ORDER — HEPARIN SOD (PORK) LOCK FLUSH 100 UNIT/ML IV SOLN
500.0000 [IU] | Freq: Once | INTRAVENOUS | Status: AC | PRN
  Administered 2020-10-24: 500 [IU]
  Filled 2020-10-24: qty 5

## 2020-10-24 NOTE — Patient Instructions (Signed)
Fluorouracil, 5-FU injection What is this medicine? FLUOROURACIL, 5-FU (flure oh YOOR a sil) is a chemotherapy drug. It slows the growth of cancer cells. This medicine is used to treat many types of cancer like breast cancer, colon or rectal cancer, pancreatic cancer, and stomach cancer. This medicine may be used for other purposes; ask your health care provider or pharmacist if you have questions. COMMON BRAND NAME(S): Adrucil What should I tell my health care provider before I take this medicine? They need to know if you have any of these conditions:  blood disorders  dihydropyrimidine dehydrogenase (DPD) deficiency  infection (especially a virus infection such as chickenpox, cold sores, or herpes)  kidney disease  liver disease  malnourished, poor nutrition  recent or ongoing radiation therapy  an unusual or allergic reaction to fluorouracil, other chemotherapy, other medicines, foods, dyes, or preservatives  pregnant or trying to get pregnant  breast-feeding How should I use this medicine? This drug is given as an infusion or injection into a vein. It is administered in a hospital or clinic by a specially trained health care professional. Talk to your pediatrician regarding the use of this medicine in children. Special care may be needed. Overdosage: If you think you have taken too much of this medicine contact a poison control center or emergency room at once. NOTE: This medicine is only for you. Do not share this medicine with others. What if I miss a dose? It is important not to miss your dose. Call your doctor or health care professional if you are unable to keep an appointment. What may interact with this medicine?  allopurinol  cimetidine  dapsone  digoxin  hydroxyurea  leucovorin  levamisole  medicines for seizures like ethotoin, fosphenytoin, phenytoin  medicines to increase blood counts like filgrastim, pegfilgrastim, sargramostim  medicines that  treat or prevent blood clots like warfarin, enoxaparin, and dalteparin  methotrexate  metronidazole  pyrimethamine  some other chemotherapy drugs like busulfan, cisplatin, estramustine, vinblastine  trimethoprim  trimetrexate  vaccines Talk to your doctor or health care professional before taking any of these medicines:  acetaminophen  aspirin  ibuprofen  ketoprofen  naproxen This list may not describe all possible interactions. Give your health care provider a list of all the medicines, herbs, non-prescription drugs, or dietary supplements you use. Also tell them if you smoke, drink alcohol, or use illegal drugs. Some items may interact with your medicine. What should I watch for while using this medicine? Visit your doctor for checks on your progress. This drug may make you feel generally unwell. This is not uncommon, as chemotherapy can affect healthy cells as well as cancer cells. Report any side effects. Continue your course of treatment even though you feel ill unless your doctor tells you to stop. In some cases, you may be given additional medicines to help with side effects. Follow all directions for their use. Call your doctor or health care professional for advice if you get a fever, chills or sore throat, or other symptoms of a cold or flu. Do not treat yourself. This drug decreases your body's ability to fight infections. Try to avoid being around people who are sick. This medicine may increase your risk to bruise or bleed. Call your doctor or health care professional if you notice any unusual bleeding. Be careful brushing and flossing your teeth or using a toothpick because you may get an infection or bleed more easily. If you have any dental work done, tell your dentist you are   receiving this medicine. Avoid taking products that contain aspirin, acetaminophen, ibuprofen, naproxen, or ketoprofen unless instructed by your doctor. These medicines may hide a fever. Do not  become pregnant while taking this medicine. Women should inform their doctor if they wish to become pregnant or think they might be pregnant. There is a potential for serious side effects to an unborn child. Talk to your health care professional or pharmacist for more information. Do not breast-feed an infant while taking this medicine. Men should inform their doctor if they wish to father a child. This medicine may lower sperm counts. Do not treat diarrhea with over the counter products. Contact your doctor if you have diarrhea that lasts more than 2 days or if it is severe and watery. This medicine can make you more sensitive to the sun. Keep out of the sun. If you cannot avoid being in the sun, wear protective clothing and use sunscreen. Do not use sun lamps or tanning beds/booths. What side effects may I notice from receiving this medicine? Side effects that you should report to your doctor or health care professional as soon as possible:  allergic reactions like skin rash, itching or hives, swelling of the face, lips, or tongue  low blood counts - this medicine may decrease the number of white blood cells, red blood cells and platelets. You may be at increased risk for infections and bleeding.  signs of infection - fever or chills, cough, sore throat, pain or difficulty passing urine  signs of decreased platelets or bleeding - bruising, pinpoint red spots on the skin, black, tarry stools, blood in the urine  signs of decreased red blood cells - unusually weak or tired, fainting spells, lightheadedness  breathing problems  changes in vision  chest pain  mouth sores  nausea and vomiting  pain, swelling, redness at site where injected  pain, tingling, numbness in the hands or feet  redness, swelling, or sores on hands or feet  stomach pain  unusual bleeding Side effects that usually do not require medical attention (report to your doctor or health care professional if they  continue or are bothersome):  changes in finger or toe nails  diarrhea  dry or itchy skin  hair loss  headache  loss of appetite  sensitivity of eyes to the light  stomach upset  unusually teary eyes This list may not describe all possible side effects. Call your doctor for medical advice about side effects. You may report side effects to FDA at 1-800-FDA-1088. Where should I keep my medicine? This drug is given in a hospital or clinic and will not be stored at home. NOTE: This sheet is a summary. It may not cover all possible information. If you have questions about this medicine, talk to your doctor, pharmacist, or health care provider.  2020 Elsevier/Gold Standard (2008-03-01 13:53:16)  

## 2020-10-25 ENCOUNTER — Encounter: Payer: Self-pay | Admitting: Hematology and Oncology
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
## 2020-10-29 ENCOUNTER — Encounter: Payer: Self-pay | Admitting: Oncology

## 2020-10-29 ENCOUNTER — Ambulatory Visit: Payer: Medicare HMO | Admitting: Hematology and Oncology

## 2020-10-29 ENCOUNTER — Inpatient Hospital Stay (INDEPENDENT_AMBULATORY_CARE_PROVIDER_SITE_OTHER): Payer: Medicare HMO | Admitting: Hematology and Oncology

## 2020-10-29 ENCOUNTER — Other Ambulatory Visit: Payer: Self-pay | Admitting: Hematology and Oncology

## 2020-10-29 ENCOUNTER — Encounter: Payer: Self-pay | Admitting: Hematology and Oncology

## 2020-10-29 ENCOUNTER — Other Ambulatory Visit: Payer: Self-pay

## 2020-10-29 ENCOUNTER — Inpatient Hospital Stay: Payer: Medicare HMO

## 2020-10-29 ENCOUNTER — Telehealth: Payer: Self-pay | Admitting: Hematology and Oncology

## 2020-10-29 VITALS — BP 119/56 | HR 97 | Temp 97.7°F | Resp 18 | Ht 70.5 in | Wt 240.3 lb

## 2020-10-29 DIAGNOSIS — C787 Secondary malignant neoplasm of liver and intrahepatic bile duct: Secondary | ICD-10-CM

## 2020-10-29 DIAGNOSIS — C20 Malignant neoplasm of rectum: Secondary | ICD-10-CM

## 2020-10-29 DIAGNOSIS — N39498 Other specified urinary incontinence: Secondary | ICD-10-CM

## 2020-10-29 LAB — COMPREHENSIVE METABOLIC PANEL
Albumin: 3.4 — AB (ref 3.5–5.0)
Calcium: 8.5 — AB (ref 8.7–10.7)

## 2020-10-29 LAB — CBC AND DIFFERENTIAL
HCT: 32 — AB (ref 41–53)
Hemoglobin: 10.2 — AB (ref 13.5–17.5)
Neutrophils Absolute: 8.13
Platelets: 220 (ref 150–399)
WBC: 9.8

## 2020-10-29 LAB — HEPATIC FUNCTION PANEL
ALT: 19 (ref 10–40)
AST: 33 (ref 14–40)
Alkaline Phosphatase: 246 — AB (ref 25–125)
Bilirubin, Total: 0.6

## 2020-10-29 LAB — BASIC METABOLIC PANEL
BUN: 26 — AB (ref 4–21)
CO2: 21 (ref 13–22)
Chloride: 99 (ref 99–108)
Creatinine: 1.2 (ref 0.6–1.3)
Glucose: 109
Potassium: 4 (ref 3.4–5.3)
Sodium: 134 — AB (ref 137–147)

## 2020-10-29 LAB — CBC: RBC: 3.59 — AB (ref 3.87–5.11)

## 2020-10-29 NOTE — Telephone Encounter (Signed)
Patient scheduled for Ralph Griffin.  Requested patient to be given updated Appt Summary on 12/27

## 2020-10-29 NOTE — Progress Notes (Signed)
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   Current Treatment:  FOLFOX/bevacizumab.   HISTORY OF PRESENT ILLNESS:  Ralph Griffin is a 79 y.o. 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  He was referred from the emergency department for the evaluation and treatment of adenocarcinoma of the cecum.  He presented to the [facility] emergency department on September 24th due to abdominal discomfort, melena, hematochezia, anorexia and weight loss.  He had been started on Eliquis the week prior by his primary care physician for atrial fibrillation.  However, in 3-4 days the patient started having black stool and bright red blood per rectum.  This medication was held as advised by his primary physician and he was advised to present to the ER.  While in the ER he was started on Protonix drip due to concern for GI bleed and GI services was consulted.  CTA revealed findings most consistent with metastatic colorectal carcinoma with innumerable hypoattenuating masses throughout the liver, the largest measuring 4.8 cm, and both regional and distal nodal metastatic disease.  There was also a growing exophytic nodule arising from the right kidney measuring 2.7 cm, previously 2.2 cm, concerning for renal cell carcinoma.  There was no evidence of active arterial GI bleeding.  He underwent EGD and total colonoscopy with biopsy on September 25th which revealed several large nonobstructing malignant appearing masses in the cecum.  Surgical pathology from this procedure confirmed adenocarcinoma with necrosis.  Iron studies were notable for iron deficiency with an iron level of 12.0 with a TIBC of 242 for a  percent saturation of 4.9%.  The patient was given IV Feraheme and started on oral supplement 325 mg BID.  He was discharged on September 26th, and lab work at that time was unremarkable except for a hemoglobin of 8.3, and a calcium of 8.2, and an elevated alkaline phosphatase.  He had his first cycle of FOLFOX/bevacizumab on October 13 and tolerated this fairly well.  As he has continued to lose weight, he was placed on Remeron 7.5 mg at bedtime to improve his appetite.  INTERVAL HISTORY:  He is here for follow up prior to a 5th cycle of FOLFOX/bevacizumab.  The mirtazipine was increased to 15 mg at bedtime. He presented to the urgent care for complaints consistent with a UTI. He was placed on an antibiotic and has two days left. He notes an improvement to both the color and odor of his urine. He also denies further incontinence.  He states that he has been well, and is tolerating treatment without difficulty.  He continues oral iron supplement TID.  His hemoglobin has increased to 10.3, and his white count and platelets are normal.  Chemistries are unremarkable. His last CEA was 57.2 and down to 25.2 in November.  His  appetite is fair, and he has lost 4 pounds since his last visit.  He denies fever, chills or other signs of infection.  He denies nausea, vomiting, bowel issues, or abdominal pain.  He denies sore throat, cough, dyspnea, or chest pain.  REVIEW OF SYSTEMS:  Review of Systems  Constitutional: Positive for appetite change. Negative for chills, diaphoresis, fatigue, fever and unexpected weight change.  HENT:   Positive  for mouth sores and trouble swallowing. Negative for hearing loss, lump/mass, nosebleeds, sore throat, tinnitus and voice change.   Eyes: Negative for eye problems and icterus.  Respiratory: Negative for chest tightness, cough, hemoptysis, shortness of breath and wheezing.   Cardiovascular: Negative for chest pain, leg swelling and palpitations.  Gastrointestinal: Negative for  abdominal distention, abdominal pain, blood in stool, constipation, diarrhea, nausea, rectal pain and vomiting.  Endocrine: Negative for hot flashes.  Genitourinary: Negative for bladder incontinence, difficulty urinating, dyspareunia, dysuria, frequency, hematuria and nocturia.   Musculoskeletal: Negative for arthralgias, back pain, flank pain, gait problem, myalgias, neck pain and neck stiffness.  Skin: Negative for itching, rash and wound.  Neurological: Negative for dizziness, extremity weakness, gait problem, headaches, light-headedness, numbness, seizures and speech difficulty.  Hematological: Negative for adenopathy. Does not bruise/bleed easily.  Psychiatric/Behavioral: Negative for confusion, decreased concentration, depression, sleep disturbance and suicidal ideas. The patient is not nervous/anxious.      VITALS:  Vital signs include a weight of 246.9 lb, this is an increase of 4 lb, blood pressure 111/51, pulse 84, respirations, 18 and  Temperature97.9. Pulse oximetry 95%.  Wt Readings from Last 3 Encounters:  11/01/20 234 lb 1.6 oz (106.2 kg)  10/29/20 240 lb 4.8 oz (109 kg)  10/24/20 242 lb 4 oz (109.9 kg)    Body mass index is 33.12 kg/m.  Performance status (ECOG): 1 - Symptomatic but completely ambulatory  PHYSICAL EXAM:  Physical Exam Constitutional:      General: He is not in acute distress.    Appearance: Normal appearance. He is normal weight. He is not ill-appearing, toxic-appearing or diaphoretic.  HENT:     Head: Normocephalic and atraumatic.     Right Ear: Tympanic membrane normal.     Left Ear: Tympanic membrane normal.     Nose: Nose normal. No congestion or rhinorrhea.     Mouth/Throat:     Mouth: Mucous membranes are moist.     Pharynx: Oropharynx is clear. No oropharyngeal exudate or posterior oropharyngeal erythema.  Eyes:     General: No scleral icterus.       Right eye: No discharge.        Left eye: No discharge.     Extraocular Movements:  Extraocular movements intact.     Conjunctiva/sclera: Conjunctivae normal.     Pupils: Pupils are equal, round, and reactive to light.  Neck:     Vascular: No carotid bruit.  Cardiovascular:     Rate and Rhythm: Normal rate and regular rhythm.     Heart sounds: No murmur heard. No friction rub. No gallop.   Pulmonary:     Effort: Pulmonary effort is normal. No respiratory distress.     Breath sounds: Normal breath sounds. No stridor. No wheezing, rhonchi or rales.  Chest:     Chest wall: No tenderness.  Abdominal:     General: Abdomen is flat. Bowel sounds are normal. There is no distension.     Palpations: There is no mass.     Tenderness: There is no abdominal tenderness. There is no right CVA tenderness, left CVA tenderness, guarding or rebound.     Hernia: No hernia is present.  Musculoskeletal:        General: No swelling, tenderness, deformity or signs of injury. Normal range of motion.     Cervical back: Normal range of motion and neck supple. No rigidity or tenderness.     Right lower leg: No edema.     Left lower  leg: No edema.  Lymphadenopathy:     Cervical: No cervical adenopathy.  Skin:    General: Skin is warm and dry.     Capillary Refill: Capillary refill takes less than 2 seconds.     Coloration: Skin is not jaundiced or pale.     Findings: No bruising, erythema, lesion or rash.  Neurological:     General: No focal deficit present.     Mental Status: He is alert and oriented to person, place, and time. Mental status is at baseline.     Cranial Nerves: No cranial nerve deficit.     Sensory: No sensory deficit.     Motor: No weakness.     Coordination: Coordination normal.     Gait: Gait normal.     Deep Tendon Reflexes: Reflexes normal.  Psychiatric:        Mood and Affect: Mood normal.        Behavior: Behavior normal.        Thought Content: Thought content normal.        Judgment: Judgment normal.   Lymph nodes:   There is no cervical, clavicular,  axillary or inguinal lymphadenopathy.  LABS:    CBC Latest Ref Rng & Units 11/01/2020 10/29/2020 10/19/2020  WBC - 4.9 9.8 4.7  Hemoglobin 13.5 - 17.5 10.3(A) 10.2(A) 9.6(A)  Hematocrit 41 - 53 32(A) 32(A) 30(A)  Platelets 150 - 399 257 220 287   CMP Latest Ref Rng & Units 11/01/2020 10/29/2020 10/19/2020  Glucose 70 - 99 mg/dL - - -  BUN 4 - 21 28(A) 26(A) 21  Creatinine 0.6 - 1.3 1.3 1.2 0.9  Sodium 137 - 147 136(A) 134(A) 137  Potassium 3.4 - 5.3 4.2 4.0 4.1  Chloride 99 - 108 108 99 103  CO2 13 - 22 16 21  23(A)  Calcium 8.7 - 10.7 9.0 8.5(A) 8.8  Total Protein 6.5 - 8.1 g/dL - - -  Total Bilirubin 0.3 - 1.2 mg/dL - - -  Alkaline Phos 25 - 125 234(A) 246(A) 323(A)  AST 14 - 40 37 33 39  ALT 10 - 40 22 19 24      No results found for: CEA1 / No results found for: CEA1    STUDIES:  No results found.   Allergies: No Known Allergies  Current Medications: Current Outpatient Medications  Medication Sig Dispense Refill  . allopurinol (ZYLOPRIM) 100 MG tablet Take 100 mg by mouth daily.    Marland Kitchen aspirin EC 81 MG tablet Take 81 mg by mouth daily.    Marland Kitchen atorvastatin (LIPITOR) 20 MG tablet Take by mouth.    . Blood Glucose Monitoring Suppl (GLUCOCOM BLOOD GLUCOSE MONITOR) DEVI Check blood sugars once a day    . Blood Glucose Monitoring Suppl (ONETOUCH VERIO FLEX SYSTEM) w/Device KIT CHECK BLOOD SUGARS ONCE A DAY    . carvedilol (COREG) 12.5 MG tablet Take by mouth.    . cholecalciferol (VITAMIN D3) 25 MCG (1000 UT) tablet Take 2,000 Units by mouth daily.    Marland Kitchen escitalopram (LEXAPRO) 10 MG tablet Take 10 mg by mouth daily.    . ferrous sulfate 325 (65 FE) MG tablet Take 325 mg by mouth 2 (two) times daily with a meal.    . gabapentin (NEURONTIN) 100 MG capsule Take by mouth.    Marland Kitchen glipiZIDE (GLUCOTROL) 10 MG tablet Take 1 tablet in morning and 1/2 tablet in evening.    Marland Kitchen glucose blood (ONETOUCH VERIO) test strip Check blood sugars once a day    .  HYDROcodone-acetaminophen  (NORCO/VICODIN) 5-325 MG tablet Take 1 tablet by mouth 3 (three) times daily as needed.    . Insulin Pen Needle (B-D UF III MINI PEN NEEDLES) 31G X 5 MM MISC Use as directed with victoza pens    . levofloxacin (LEVAQUIN) 750 MG tablet Take 750 mg by mouth daily.    . magic mouthwash w/lidocaine SOLN Take 5 mLs by mouth 4 (four) times daily as needed for mouth pain. 240 mL 0  . metFORMIN (GLUCOPHAGE) 500 MG tablet Take 1,000 mg by mouth 2 (two) times daily.    . mirtazapine (REMERON) 7.5 MG tablet TAKE 1 TABLET BY MOUTH AT BEDTIME. 90 tablet 2  . Multiple Vitamins-Minerals (CENTRUM SILVER 50+MEN PO) Take 1 tablet by mouth daily.    Marland Kitchen omeprazole (PRILOSEC) 40 MG capsule Take 40 mg by mouth daily.    . ondansetron (ZOFRAN) 4 MG tablet Take 4 mg by mouth every 8 (eight) hours as needed for nausea or vomiting.    Glory Rosebush Delica Lancets 95M MISC CHECK BLOOD SUGAR EVERY DAY AS DIRECTED.    Marland Kitchen ONETOUCH VERIO test strip daily.    Dellia Nims CALCIUM + D3 500-200 MG-UNIT TABS Take 1 tablet by mouth every morning.    . prochlorperazine (COMPAZINE) 10 MG tablet Take 10 mg by mouth every 6 (six) hours as needed for nausea or vomiting.    Marland Kitchen rOPINIRole (REQUIP) 3 MG tablet Take by mouth.    . Semaglutide,0.25 or 0.5MG/DOS, (OZEMPIC, 0.25 OR 0.5 MG/DOSE,) 2 MG/1.5ML SOPN Inject 1 mg into the skin once a week.    . vitamin B-12 (CYANOCOBALAMIN) 1000 MCG tablet Take 1,000 mcg by mouth daily.     No current facility-administered medications for this visit.     ASSESSMENT & PLAN:   Assessment:   1. 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  We do not advise surgical resection or radiation.  MSI and MMR were normal, but he was positive for mutations of KRAS and PIK3CA.  He has regional and distal nodal metastatic disease.  He is receiving palliative FOLFOX/bevicizumab and tolerating this well.  2. Innumerable hypoattenuating masses throughout the liver  consistent with metastatic disease.    3. Iron deficiency anemia for which he is on oral iron supplement three times daily.  He will continue this regimen.  4. Exophytic nodule arising from the right kidney measuring 2.7 cm.  This has mildly enlarged since prior study, but has been present for the past 3 years.  We will continue to monitor this.  5. Complicated pain from issues with his spine, for which he sees pain management.  He is currently on hydrocodone.  6.  Numbness and paresthesias of his fingers, from prior spinal surgery.  This remains stable.   Plan: Overall, he is doing quite well and his CEA has shown good improvement.  He knows to continue mirtazapine to 15 mg at bedtime.  We will proceed with a 5th cycle on December 27th. We will  plan to see him back in 2 weeks with a CBC, comprehensive metabolic panel and CEA prior to a 6th cycle palliative FOLFOX/bevacizumab.  We will plan to repeat imaging after a 6th cycle at the end of January.  The patient and his son understand the plans discussed today and are in agreement with them.  They know to contact our office if he develops issues during immediate clinical assessment.   Melodye Ped, NP [place] [facility]  [place] [address] [place] [address] Dept: [phone] Dept Fax: [phone]

## 2020-10-30 ENCOUNTER — Encounter: Payer: Self-pay | Admitting: Hematology and Oncology

## 2020-11-01 ENCOUNTER — Encounter: Payer: Self-pay | Admitting: Hematology and Oncology

## 2020-11-01 ENCOUNTER — Telehealth: Payer: Self-pay | Admitting: Hematology and Oncology

## 2020-11-01 ENCOUNTER — Inpatient Hospital Stay (INDEPENDENT_AMBULATORY_CARE_PROVIDER_SITE_OTHER): Payer: Medicare HMO | Admitting: Hematology and Oncology

## 2020-11-01 ENCOUNTER — Other Ambulatory Visit: Payer: Self-pay

## 2020-11-01 ENCOUNTER — Inpatient Hospital Stay: Payer: Medicare HMO

## 2020-11-01 ENCOUNTER — Other Ambulatory Visit: Payer: Self-pay | Admitting: Hematology and Oncology

## 2020-11-01 VITALS — BP 108/65 | HR 104 | Temp 97.7°F | Resp 18 | Ht 70.5 in | Wt 234.1 lb

## 2020-11-01 DIAGNOSIS — C20 Malignant neoplasm of rectum: Secondary | ICD-10-CM

## 2020-11-01 DIAGNOSIS — Z5111 Encounter for antineoplastic chemotherapy: Secondary | ICD-10-CM | POA: Diagnosis not present

## 2020-11-01 DIAGNOSIS — C787 Secondary malignant neoplasm of liver and intrahepatic bile duct: Secondary | ICD-10-CM

## 2020-11-01 LAB — HEPATIC FUNCTION PANEL
ALT: 22 (ref 10–40)
AST: 37 (ref 14–40)
Alkaline Phosphatase: 234 — AB (ref 25–125)
Bilirubin, Total: 0.5

## 2020-11-01 LAB — COMPREHENSIVE METABOLIC PANEL
Albumin: 3.4 — AB (ref 3.5–5.0)
Calcium: 9 (ref 8.7–10.7)

## 2020-11-01 LAB — BASIC METABOLIC PANEL
BUN: 28 — AB (ref 4–21)
CO2: 16 (ref 13–22)
Chloride: 108 (ref 99–108)
Creatinine: 1.3 (ref 0.6–1.3)
Glucose: 121
Potassium: 4.2 (ref 3.4–5.3)
Sodium: 136 — AB (ref 137–147)

## 2020-11-01 LAB — CBC: RBC: 3.64 — AB (ref 3.87–5.11)

## 2020-11-01 LAB — CBC AND DIFFERENTIAL
HCT: 32 — AB (ref 41–53)
Hemoglobin: 10.3 — AB (ref 13.5–17.5)
Neutrophils Absolute: 3.19
Platelets: 257 (ref 150–399)
WBC: 4.9

## 2020-11-01 LAB — CORRECTED CALCIUM (CC13): Calcium, Corrected: 9.6

## 2020-11-01 MED ORDER — MAGIC MOUTHWASH W/LIDOCAINE: 5.0000 mL | Freq: Four times a day (QID) | ORAL | 0 refills | Status: DC | PRN

## 2020-11-01 NOTE — Telephone Encounter (Signed)
Per 12/23 LOS - Patient has been scheduled for Jan 2022 Appt's already

## 2020-11-02 LAB — CEA: CEA: 12.9 ng/mL — ABNORMAL HIGH (ref 0.0–4.7)

## 2020-11-05 ENCOUNTER — Encounter: Payer: Self-pay | Admitting: Hematology and Oncology

## 2020-11-05 ENCOUNTER — Inpatient Hospital Stay: Payer: Medicare HMO

## 2020-11-05 ENCOUNTER — Other Ambulatory Visit: Payer: Self-pay

## 2020-11-05 VITALS — BP 115/54 | HR 71 | Temp 97.8°F | Resp 16 | Ht 70.5 in | Wt 227.2 lb

## 2020-11-05 DIAGNOSIS — C20 Malignant neoplasm of rectum: Secondary | ICD-10-CM

## 2020-11-05 DIAGNOSIS — Z5111 Encounter for antineoplastic chemotherapy: Secondary | ICD-10-CM | POA: Diagnosis not present

## 2020-11-05 MED ORDER — OXALIPLATIN CHEMO INJECTION 100 MG/20ML
76.5000 mg/m2 | Freq: Once | INTRAVENOUS | Status: AC
  Administered 2020-11-05: 185 mg via INTRAVENOUS
  Filled 2020-11-05: qty 37

## 2020-11-05 MED ORDER — LEUCOVORIN CALCIUM INJECTION 100 MG
50.0000 mg | Freq: Once | INTRAMUSCULAR | Status: AC
  Administered 2020-11-05: 50 mg via INTRAVENOUS
  Filled 2020-11-05: qty 2.5

## 2020-11-05 MED ORDER — SODIUM CHLORIDE 0.9 % IV SOLN
550.0000 mg | Freq: Once | INTRAVENOUS | Status: AC
  Administered 2020-11-05: 550 mg via INTRAVENOUS
  Filled 2020-11-05: qty 16

## 2020-11-05 MED ORDER — FLUOROURACIL CHEMO INJECTION 2.5 GM/50ML
360.0000 mg/m2 | Freq: Once | INTRAVENOUS | Status: AC
  Administered 2020-11-05: 850 mg via INTRAVENOUS
  Filled 2020-11-05: qty 17

## 2020-11-05 MED ORDER — PALONOSETRON HCL INJECTION 0.25 MG/5ML
0.2500 mg | Freq: Once | INTRAVENOUS | Status: AC
  Administered 2020-11-05: 0.25 mg via INTRAVENOUS

## 2020-11-05 MED ORDER — HEPARIN SOD (PORK) LOCK FLUSH 100 UNIT/ML IV SOLN
500.0000 [IU] | Freq: Once | INTRAVENOUS | Status: DC | PRN
  Filled 2020-11-05: qty 5

## 2020-11-05 MED ORDER — DEXTROSE 5 % IV SOLN
Freq: Once | INTRAVENOUS | Status: AC
  Filled 2020-11-05: qty 250

## 2020-11-05 MED ORDER — PALONOSETRON HCL INJECTION 0.25 MG/5ML
INTRAVENOUS | Status: AC
  Filled 2020-11-05: qty 5

## 2020-11-05 MED ORDER — SODIUM CHLORIDE 0.9 % IV SOLN
10.0000 mg | Freq: Once | INTRAVENOUS | Status: AC
  Administered 2020-11-05: 10 mg via INTRAVENOUS
  Filled 2020-11-05: qty 10

## 2020-11-05 MED ORDER — FLUOROURACIL CHEMO INJECTION 5 GM/100ML
2160.0000 mg/m2 | INTRAVENOUS | Status: DC
  Administered 2020-11-05: 5200 mg via INTRAVENOUS
  Filled 2020-11-05: qty 104

## 2020-11-05 NOTE — Patient Instructions (Signed)
[facility] [place] Discharge Instructions for Patients Receiving Chemotherapy           Today you received the following chemotherapy agents Bevacizumab, Fluorouracil, Oxaliplatin,          Leucovorin    To help prevent nausea and vomiting after your treatment, we encourage you to take your nausea medication.   If you develop nausea and vomiting that is not controlled by your nausea medication, call the clinic.   BELOW ARE SYMPTOMS THAT SHOULD BE REPORTED IMMEDIATELY:  *FEVER GREATER THAN 100.5 F  *CHILLS WITH OR WITHOUT FEVER  NAUSEA AND VOMITING THAT IS NOT CONTROLLED WITH YOUR NAUSEA MEDICATION  *UNUSUAL SHORTNESS OF BREATH  *UNUSUAL BRUISING OR BLEEDING  TENDERNESS IN MOUTH AND THROAT WITH OR WITHOUT PRESENCE OF ULCERS  *URINARY PROBLEMS  *BOWEL PROBLEMS  UNUSUAL RASH Items with * indicate a potential emergency and should be followed up as soon as possible.  Feel free to call the clinic should you have any questions or concerns at The clinic phone number is [phone].  Please show the CHEMO ALERT CARD at check-in to the Emergency Department and triage nurse.   

## 2020-11-07 ENCOUNTER — Other Ambulatory Visit: Payer: Self-pay

## 2020-11-07 ENCOUNTER — Inpatient Hospital Stay: Payer: Medicare HMO

## 2020-11-07 DIAGNOSIS — Z5111 Encounter for antineoplastic chemotherapy: Secondary | ICD-10-CM | POA: Diagnosis not present

## 2020-11-07 DIAGNOSIS — C20 Malignant neoplasm of rectum: Secondary | ICD-10-CM

## 2020-11-07 MED ORDER — HEPARIN SOD (PORK) LOCK FLUSH 100 UNIT/ML IV SOLN
500.0000 [IU] | Freq: Once | INTRAVENOUS | Status: AC | PRN
  Administered 2020-11-07: 500 [IU]
  Filled 2020-11-07: qty 5

## 2020-11-07 NOTE — Patient Instructions (Signed)
[facility] [place] Discharge Instructions for Patients Receiving Chemotherapy  Today you received the following chemotherapy agents Fluorouracil  To help prevent nausea and vomiting after your treatment, we encourage you to take your nausea medication.   If you develop nausea and vomiting that is not controlled by your nausea medication, call the clinic.   BELOW ARE SYMPTOMS THAT SHOULD BE REPORTED IMMEDIATELY:  *FEVER GREATER THAN 100.5 F  *CHILLS WITH OR WITHOUT FEVER  NAUSEA AND VOMITING THAT IS NOT CONTROLLED WITH YOUR NAUSEA MEDICATION  *UNUSUAL SHORTNESS OF BREATH  *UNUSUAL BRUISING OR BLEEDING  TENDERNESS IN MOUTH AND THROAT WITH OR WITHOUT PRESENCE OF ULCERS  *URINARY PROBLEMS  *BOWEL PROBLEMS  UNUSUAL RASH Items with * indicate a potential emergency and should be followed up as soon as possible.  Feel free to call the clinic should you have any questions or concerns at The clinic phone number is [phone].  Please show the CHEMO ALERT CARD at check-in to the Emergency Department and triage nurse.   

## 2020-11-12 ENCOUNTER — Other Ambulatory Visit: Payer: Self-pay | Admitting: Oncology

## 2020-11-12 ENCOUNTER — Encounter: Payer: Self-pay | Admitting: Hematology and Oncology

## 2020-11-12 DIAGNOSIS — C20 Malignant neoplasm of rectum: Secondary | ICD-10-CM

## 2020-11-14 ENCOUNTER — Inpatient Hospital Stay: Payer: Medicare HMO | Attending: Oncology

## 2020-11-14 ENCOUNTER — Other Ambulatory Visit: Payer: Self-pay | Admitting: Hematology and Oncology

## 2020-11-14 ENCOUNTER — Other Ambulatory Visit: Payer: Self-pay

## 2020-11-14 ENCOUNTER — Telehealth: Payer: Self-pay | Admitting: Hematology and Oncology

## 2020-11-14 ENCOUNTER — Inpatient Hospital Stay (INDEPENDENT_AMBULATORY_CARE_PROVIDER_SITE_OTHER): Payer: Medicare HMO | Admitting: Hematology and Oncology

## 2020-11-14 VITALS — BP 112/60 | HR 96 | Temp 97.6°F | Resp 16 | Ht 70.5 in | Wt 225.9 lb

## 2020-11-14 DIAGNOSIS — C787 Secondary malignant neoplasm of liver and intrahepatic bile duct: Secondary | ICD-10-CM

## 2020-11-14 DIAGNOSIS — C20 Malignant neoplasm of rectum: Secondary | ICD-10-CM

## 2020-11-14 LAB — CBC AND DIFFERENTIAL
HCT: 35 — AB (ref 41–53)
Hemoglobin: 11.4 — AB (ref 13.5–17.5)
Neutrophils Absolute: 8.2
Platelets: 256 (ref 150–399)
WBC: 10

## 2020-11-14 LAB — CBC: RBC: 3.91 (ref 3.87–5.11)

## 2020-11-14 LAB — HEPATIC FUNCTION PANEL
ALT: 17 (ref 10–40)
AST: 31 (ref 14–40)
Alkaline Phosphatase: 209 — AB (ref 25–125)
Bilirubin, Total: 0.6

## 2020-11-14 LAB — COMPREHENSIVE METABOLIC PANEL
Albumin: 3.2 — AB (ref 3.5–5.0)
Calcium: 9.7 (ref 8.7–10.7)

## 2020-11-14 LAB — BASIC METABOLIC PANEL
BUN: 29 — AB (ref 4–21)
CO2: 21 (ref 13–22)
Chloride: 105 (ref 99–108)
Creatinine: 1 (ref 0.6–1.3)
Glucose: 279
Potassium: 4.1 (ref 3.4–5.3)
Sodium: 134 — AB (ref 137–147)

## 2020-11-14 MED ORDER — MIRTAZAPINE 15 MG PO TABS: 15.0000 mg | ORAL_TABLET | Freq: Every day | ORAL | 3 refills | Status: DC

## 2020-11-14 MED ORDER — PROCHLORPERAZINE MALEATE 10 MG PO TABS: 10.0000 mg | ORAL_TABLET | Freq: Four times a day (QID) | ORAL | 3 refills | Status: AC | PRN

## 2020-11-14 NOTE — Telephone Encounter (Signed)
Per 1/5 los next appt given to patient 

## 2020-11-14 NOTE — Progress Notes (Signed)
ppai

## 2020-11-14 NOTE — Progress Notes (Unsigned)
Ralph Griffin  [address] 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   Current Treatment:  FOLFOX/bevacizumab.   HISTORY OF PRESENT ILLNESS:  Ralph Griffin is a 80 y.o. 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  He was referred from the emergency department for the evaluation and treatment of adenocarcinoma of the cecum.  He presented to the [facility] emergency department on September 24th due to abdominal discomfort, melena, hematochezia, anorexia and weight loss.  He had been started on Eliquis the week prior by his primary care physician for atrial fibrillation.  However, in 3-4 days the patient started having black stool and bright red blood per rectum.  This medication was held as advised by his primary physician and he was advised to present to the ER.  While in the ER he was started on Protonix drip due to concern for GI bleed and GI services was consulted.  CTA revealed findings most consistent with metastatic colorectal carcinoma with innumerable hypoattenuating masses throughout the liver, the largest measuring 4.8 cm, and both regional and distal nodal metastatic disease.  There was also a growing exophytic nodule arising from the right kidney measuring 2.7 cm, previously 2.2 cm, concerning for renal cell carcinoma.  There was no evidence of active arterial GI bleeding.  He underwent EGD and total colonoscopy with biopsy on September 25th which revealed several large nonobstructing malignant appearing masses in the cecum.  Surgical pathology from this procedure confirmed adenocarcinoma with necrosis.  Iron studies were notable for iron deficiency with an iron level of 12.0 with a TIBC of 242 for a  percent saturation of 4.9%.  The patient was given IV Feraheme and started on oral supplement 325 mg BID.  He was discharged on September 26th, and lab work at that time was unremarkable except for a hemoglobin of 8.3, and a calcium of 8.2, and an elevated alkaline phosphatase.  He had his first cycle of FOLFOX/bevacizumab on October 13 and tolerated this fairly well.  As he has continued to lose weight, he was placed on Remeron 7.5 mg at bedtime to improve his appetite.  INTERVAL HISTORY:  He is here for follow up prior to a 6th cycle of FOLFOX/bevacizumab.  The mirtazipine was increased to 15 mg at bedtime.  He also denies further incontinence.  He states that he has been well, and is tolerating treatment without difficulty.  He continues oral iron supplement TID.  His hemoglobin has increased to 10.3, and his white count and platelets are normal.  Chemistries are unremarkable. His last CEA was 57.2 and down to 25.2 in November.  His  appetite is fair, and he has lost 4 pounds since his last visit.  He denies fever, chills or other signs of infection.  He denies nausea, vomiting, bowel issues, or abdominal pain.  He denies sore throat, cough, dyspnea, or chest pain.  REVIEW OF SYSTEMS:  Review of Systems  Constitutional: Negative for appetite change, chills, diaphoresis, fatigue, fever and unexpected weight change.  HENT:   Negative for hearing loss, lump/mass, mouth sores, nosebleeds, sore throat, tinnitus, trouble swallowing and voice change.   Eyes: Negative for eye problems and icterus.  Respiratory: Negative for chest tightness, cough, hemoptysis, shortness of breath and wheezing.  Cardiovascular: Negative for chest pain, leg swelling and palpitations.  Gastrointestinal: Negative for abdominal distention, abdominal pain, blood in stool, constipation, diarrhea, nausea, rectal pain and vomiting.  Endocrine: Negative for hot flashes.  Genitourinary: Negative for bladder incontinence, difficulty  urinating, dyspareunia, dysuria, frequency, hematuria and nocturia.   Musculoskeletal: Negative for arthralgias, back pain, flank pain, gait problem, myalgias, neck pain and neck stiffness.  Skin: Negative for itching, rash and wound.  Neurological: Negative for dizziness, extremity weakness, gait problem, headaches, light-headedness, numbness, seizures and speech difficulty.  Hematological: Negative for adenopathy. Does not bruise/bleed easily.  Psychiatric/Behavioral: Negative for confusion, decreased concentration, depression, sleep disturbance and suicidal ideas. The patient is not nervous/anxious.      VITALS:  Vital signs include a weight of 246.9 lb, this is an increase of 4 lb, blood pressure 111/51, pulse 84, respirations, 18 and  Temperature97.9. Pulse oximetry 95%.  Wt Readings from Last 3 Encounters:  11/05/20 227 lb 4 oz (103.1 kg)  11/01/20 234 lb 1.6 oz (106.2 kg)  10/29/20 240 lb 4.8 oz (109 kg)    There is no height or weight on file to calculate BMI.  Performance status (ECOG): 1 - Symptomatic but completely ambulatory  PHYSICAL EXAM:  Physical Exam Constitutional:      General: He is not in acute distress.    Appearance: Normal appearance. He is normal weight. He is not ill-appearing, toxic-appearing or diaphoretic.  HENT:     Head: Normocephalic and atraumatic.     Right Ear: Tympanic membrane normal.     Left Ear: Tympanic membrane normal.     Nose: Nose normal. No congestion or rhinorrhea.     Mouth/Throat:     Mouth: Mucous membranes are moist.     Pharynx: Oropharynx is clear. No oropharyngeal exudate or posterior oropharyngeal erythema.  Eyes:     General: No scleral icterus.       Right eye: No discharge.        Left eye: No discharge.     Extraocular Movements: Extraocular movements intact.     Conjunctiva/sclera: Conjunctivae normal.     Pupils: Pupils are equal, round, and reactive to light.  Neck:     Vascular: No carotid bruit.   Cardiovascular:     Rate and Rhythm: Normal rate and regular rhythm.     Heart sounds: No murmur heard. No friction rub. No gallop.   Pulmonary:     Effort: Pulmonary effort is normal. No respiratory distress.     Breath sounds: Normal breath sounds. No stridor. No wheezing, rhonchi or rales.  Chest:     Chest wall: No tenderness.  Abdominal:     General: Abdomen is flat. Bowel sounds are normal. There is no distension.     Palpations: There is no mass.     Tenderness: There is no abdominal tenderness. There is no right CVA tenderness, left CVA tenderness, guarding or rebound.     Hernia: No hernia is present.  Musculoskeletal:        General: No swelling, tenderness, deformity or signs of injury. Normal range of motion.     Cervical back: Normal range of motion and neck supple. No rigidity or tenderness.     Right lower leg: No edema.     Left lower leg: No edema.  Lymphadenopathy:     Cervical: No cervical adenopathy.  Skin:    General: Skin is warm and dry.     Capillary Refill: Capillary refill takes less than 2 seconds.  Coloration: Skin is not jaundiced or pale.     Findings: No bruising, erythema, lesion or rash.  Neurological:     General: No focal deficit present.     Mental Status: He is alert and oriented to person, place, and time. Mental status is at baseline.     Cranial Nerves: No cranial nerve deficit.     Sensory: No sensory deficit.     Motor: No weakness.     Coordination: Coordination normal.     Gait: Gait normal.     Deep Tendon Reflexes: Reflexes normal.  Psychiatric:        Mood and Affect: Mood normal.        Behavior: Behavior normal.        Thought Content: Thought content normal.        Judgment: Judgment normal.   Lymph nodes:   There is no cervical, clavicular, axillary or inguinal lymphadenopathy.  LABS:    CBC Latest Ref Rng & Units 11/01/2020 10/29/2020 10/19/2020  WBC - 4.9 9.8 4.7  Hemoglobin 13.5 - 17.5 10.3(A) 10.2(A) 9.6(A)   Hematocrit 41 - 53 32(A) 32(A) 30(A)  Platelets 150 - 399 257 220 287   CMP Latest Ref Rng & Units 11/01/2020 10/29/2020 10/19/2020  Glucose 70 - 99 mg/dL - - -  BUN 4 - 21 28(A) 26(A) 21  Creatinine 0.6 - 1.3 1.3 1.2 0.9  Sodium 137 - 147 136(A) 134(A) 137  Potassium 3.4 - 5.3 4.2 4.0 4.1  Chloride 99 - 108 108 99 103  CO2 13 - 22 16 21  23(A)  Calcium 8.7 - 10.7 9.0 8.5(A) 8.8  Total Protein 6.5 - 8.1 g/dL - - -  Total Bilirubin 0.3 - 1.2 mg/dL - - -  Alkaline Phos 25 - 125 234(A) 246(A) 323(A)  AST 14 - 40 37 33 39  ALT 10 - 40 22 19 24      Lab Results  Component Value Date   CEA1 12.9 (H) 11/01/2020   /  CEA  Date Value Ref Range Status  11/01/2020 12.9 (H) 0.0 - 4.7 ng/mL Final    Comment:    (NOTE)                             Nonsmokers          <3.9                             Smokers             <5.6 [employer] Electrochemiluminescence Immunoassay (ECLIA) Values obtained with different assay methods or kits cannot be used interchangeably.  Results cannot be interpreted as absolute evidence of the presence or absence of malignant disease. Performed At: [facility] [place], [place] [id number] Rush Farmer MD QV:[id number]       STUDIES:  No results found.   Allergies: No Known Allergies  Current Medications: Current Outpatient Medications  Medication Sig Dispense Refill  . allopurinol (ZYLOPRIM) 100 MG tablet Take 100 mg by mouth daily.    Marland Kitchen aspirin EC 81 MG tablet Take 81 mg by mouth daily.    Marland Kitchen atorvastatin (LIPITOR) 20 MG tablet Take by mouth.    . Blood Glucose Monitoring Suppl (GLUCOCOM BLOOD GLUCOSE MONITOR) DEVI Check blood sugars once a day    . Blood Glucose Monitoring Suppl ([place]) w/Device KIT CHECK  BLOOD SUGARS ONCE A DAY    . carvedilol (COREG) 12.5 MG tablet Take by mouth.    . cholecalciferol (VITAMIN D3) 25 MCG (1000 UT) tablet Take 2,000 Units by mouth daily.    Marland Kitchen escitalopram  (LEXAPRO) 10 MG tablet Take 10 mg by mouth daily.    . ferrous sulfate 325 (65 FE) MG tablet Take 325 mg by mouth 2 (two) times daily with a meal.    . gabapentin (NEURONTIN) 100 MG capsule Take by mouth.    Marland Kitchen glipiZIDE (GLUCOTROL) 10 MG tablet Take 1 tablet in morning and 1/2 tablet in evening.    Marland Kitchen glucose blood (ONETOUCH VERIO) test strip Check blood sugars once a day    . HYDROcodone-acetaminophen (NORCO/VICODIN) 5-325 MG tablet Take 1 tablet by mouth 3 (three) times daily as needed.    . Insulin Pen Needle (B-D UF III MINI PEN NEEDLES) 31G X 5 MM MISC Use as directed with victoza pens    . levofloxacin (LEVAQUIN) 750 MG tablet Take 750 mg by mouth daily.    . magic mouthwash w/lidocaine SOLN Take 5 mLs by mouth 4 (four) times daily as needed for mouth pain. 240 mL 0  . metFORMIN (GLUCOPHAGE) 500 MG tablet Take 1,000 mg by mouth 2 (two) times daily.    . mirtazapine (REMERON) 7.5 MG tablet TAKE 1 TABLET BY MOUTH AT BEDTIME. 90 tablet 2  . Multiple Vitamins-Minerals (CENTRUM SILVER 50+MEN PO) Take 1 tablet by mouth daily.    Marland Kitchen omeprazole (PRILOSEC) 40 MG capsule Take 40 mg by mouth daily.    . ondansetron (ZOFRAN) 4 MG tablet Take 4 mg by mouth every 8 (eight) hours as needed for nausea or vomiting.    Glory Rosebush Delica Lancets 67H MISC CHECK BLOOD SUGAR EVERY DAY AS DIRECTED.    Marland Kitchen ONETOUCH VERIO test strip daily.    Dellia Nims CALCIUM + D3 500-200 MG-UNIT TABS Take 1 tablet by mouth every morning.    . prochlorperazine (COMPAZINE) 10 MG tablet Take 10 mg by mouth every 6 (six) hours as needed for nausea or vomiting.    Marland Kitchen rOPINIRole (REQUIP) 3 MG tablet Take by mouth.    . Semaglutide,0.25 or 0.5MG/DOS, (OZEMPIC, 0.25 OR 0.5 MG/DOSE,) 2 MG/1.5ML SOPN Inject 1 mg into the skin once a week.    . vitamin B-12 (CYANOCOBALAMIN) 1000 MCG tablet Take 1,000 mcg by mouth daily.     No current facility-administered medications for this visit.     ASSESSMENT & PLAN:   Assessment:   1. Stage IV  adenocarcinoma with several large nonobstructing malignant appearing masses in the cecum.  We do not advise surgical resection or radiation.  MSI and MMR were normal, but he was positive for mutations of KRAS and PIK3CA.  He has regional and distal nodal metastatic disease.  He is receiving palliative FOLFOX/bevicizumab and tolerating this well.  2. Innumerable hypoattenuating masses throughout the liver consistent with metastatic disease.    3. Iron deficiency anemia for which he is on oral iron supplement three times daily.  He will continue this regimen.  4. Exophytic nodule arising from the right kidney measuring 2.7 cm.  This has mildly enlarged since prior study, but has been present for the past 3 years.  We will continue to monitor this.  5. Complicated pain from issues with his spine, for which he sees pain management.  He is currently on hydrocodone.  6.  Numbness and paresthesias of his fingers, from prior spinal surgery.  This remains stable.   Plan: Overall, he is doing quite well and his CEA has shown good improvement.  He knows to continue mirtazapine to 15 mg at bedtime.  We will proceed with a 6th cycle on 11-19-2020 . We will  plan to see him back in 2 weeks with a CBC, comprehensive metabolic panel and CEA prior to a 7th cycle palliative FOLFOX/bevacizumab.  We will plan to repeat imaging after a 6th cycle at the end of January.  The patient and his son understand the plans discussed today and are in agreement with them.  They know to contact our office if he develops issues during immediate clinical assessment.   Melodye Ped, NP [facility] [address] [place] [address] Dept: [phone] Dept Fax: [phone]

## 2020-11-15 MED ORDER — ONDANSETRON HCL 4 MG PO TABS: 4.0000 mg | ORAL_TABLET | ORAL | 3 refills | Status: AC | PRN

## 2020-11-16 MED FILL — Oxaliplatin IV Soln 100 MG/20ML: INTRAVENOUS | Qty: 37 | Status: AC

## 2020-11-16 MED FILL — Fluorouracil IV Soln 2.5 GM/50ML (50 MG/ML): INTRAVENOUS | Qty: 17 | Status: AC

## 2020-11-16 MED FILL — Fluorouracil IV Soln 5 GM/100ML (50 MG/ML): INTRAVENOUS | Qty: 100 | Status: AC

## 2020-11-16 MED FILL — Bevacizumab-bvzr IV Soln 400 MG/16ML (For Infusion): INTRAVENOUS | Qty: 22 | Status: AC

## 2020-11-16 MED FILL — Leucovorin Calcium For Inj 100 MG: INTRAMUSCULAR | Qty: 2.5 | Status: AC

## 2020-11-16 MED FILL — Dexamethasone Sodium Phosphate Inj 100 MG/10ML: INTRAMUSCULAR | Qty: 1 | Status: AC

## 2020-11-19 ENCOUNTER — Other Ambulatory Visit: Payer: Self-pay

## 2020-11-19 ENCOUNTER — Inpatient Hospital Stay: Payer: Medicare HMO

## 2020-11-21 ENCOUNTER — Inpatient Hospital Stay: Payer: Medicare HMO

## 2020-11-22 ENCOUNTER — Encounter: Payer: Self-pay | Admitting: Hematology and Oncology

## 2020-11-27 ENCOUNTER — Other Ambulatory Visit: Payer: Self-pay | Admitting: Oncology

## 2020-11-28 ENCOUNTER — Inpatient Hospital Stay: Payer: Medicare HMO

## 2020-11-28 ENCOUNTER — Other Ambulatory Visit: Payer: Self-pay

## 2020-11-28 ENCOUNTER — Telehealth: Payer: Self-pay | Admitting: Oncology

## 2020-11-28 ENCOUNTER — Inpatient Hospital Stay (INDEPENDENT_AMBULATORY_CARE_PROVIDER_SITE_OTHER): Payer: Medicare HMO | Admitting: Hematology and Oncology

## 2020-11-28 VITALS — BP 106/67 | HR 96 | Resp 18

## 2020-11-28 DIAGNOSIS — M4802 Spinal stenosis, cervical region: Secondary | ICD-10-CM

## 2020-11-28 DIAGNOSIS — C180 Malignant neoplasm of cecum: Secondary | ICD-10-CM

## 2020-11-28 DIAGNOSIS — C787 Secondary malignant neoplasm of liver and intrahepatic bile duct: Secondary | ICD-10-CM | POA: Diagnosis not present

## 2020-11-28 DIAGNOSIS — E860 Dehydration: Secondary | ICD-10-CM | POA: Diagnosis not present

## 2020-11-28 DIAGNOSIS — C20 Malignant neoplasm of rectum: Secondary | ICD-10-CM | POA: Diagnosis not present

## 2020-11-28 DIAGNOSIS — I959 Hypotension, unspecified: Secondary | ICD-10-CM | POA: Insufficient documentation

## 2020-11-28 LAB — HEPATIC FUNCTION PANEL
ALT: 24 (ref 10–40)
AST: 40 (ref 14–40)
Alkaline Phosphatase: 340 — AB (ref 25–125)
Bilirubin, Total: 0.6

## 2020-11-28 LAB — BASIC METABOLIC PANEL
BUN: 18 (ref 4–21)
CO2: 20 (ref 13–22)
Chloride: 110 — AB (ref 99–108)
Creatinine: 1.1 (ref 0.6–1.3)
Glucose: 149
Potassium: 4.9 (ref 3.4–5.3)
Sodium: 137 (ref 137–147)

## 2020-11-28 LAB — CBC AND DIFFERENTIAL
HCT: 40 — AB (ref 41–53)
Hemoglobin: 12.7 — AB (ref 13.5–17.5)
Neutrophils Absolute: 6.64
Platelets: 397 (ref 150–399)
WBC: 8.4

## 2020-11-28 LAB — COMPREHENSIVE METABOLIC PANEL
Albumin: 3.1 — AB (ref 3.5–5.0)
Calcium: 8.8 (ref 8.7–10.7)

## 2020-11-28 LAB — CBC: RBC: 4.35 (ref 3.87–5.11)

## 2020-11-28 LAB — CORRECTED CALCIUM (CC13): Calcium, Corrected: 9.7

## 2020-11-28 NOTE — Progress Notes (Signed)
Zeb  [address] 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   Current Treatment:  FOLFOX/bevacizumab.  HISTORY OF PRESENT ILLNESS:  Ralph Griffin is a 80 y.o. 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  He was referred from the emergency department for the evaluation and treatment of adenocarcinoma of the cecum.  He presented to the [facility] emergency department on September 24th due to abdominal discomfort, melena, hematochezia, anorexia and weight loss.  He had been started on apixaban the week prior by his primary care physician for atrial fibrillation.  However, in 3-4 days the patient started having black stool and bright red blood per rectum.  This medication was held as advised by his primary physician and he was advised to present to the ER.  While in the ER he was started on pantoprazole drip due to concern for GI bleed and GI services was consulted.  CTA imaging revealed findings most consistent with metastatic colorectal carcinoma with innumerable hypoattenuating masses throughout the liver, the largest measuring 4.8 cm, and both regional and distal nodal metastatic disease.  There was also a growing exophytic nodule arising from the right kidney measuring 2.7 cm, previously 2.2 cm, concerning for renal cell carcinoma.  There was no evidence of active arterial GI bleeding.  He underwent EGD and total colonoscopy with biopsy on September 25th, which revealed several large nonobstructing malignant appearing masses in the cecum.  Surgical pathology from this procedure confirmed adenocarcinoma with necrosis.  MMR was normal.  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 Iron studies were notable for iron deficiency with an iron level of 12.0 with a TIBC of 242 for a percent saturation of 4.9%.  The patient was given IV Feraheme and started on oral supplement 325 mg BID. His iron supplement was subsequently increased to TID. He was discharged on September 26th, and lab work at that time was unremarkable except for a hemoglobin of 8.3, and a calcium of 8.2, and an elevated alkaline phosphatase.  He had his first cycle of FOLFOX/bevacizumab on October 13th and tolerated this fairly well.  As he has continued to lose weight, he was placed on mirtazapine 7.5 mg at bedtime to improve his appetite, this was subsequently increased to 15 mg at bedtime.  The CEA has steadily decreased from 57.2 down to 25.2 in November and 12.9 in December.  INTERVAL HISTORY:  He is here for follow up prior to a 7th cycle of FOLFOX/bevacizumab.  Since his last chemotherapy, he has been very weak and fatigued  He has been spending the majority of his time in bed.  He has not been eating or drinking well. He had a fall a couple of weeks ago and was seen a few days later in the emergency department with back pain a few days later.  CT abdomen and pelvis on January 8th revealed a mixed response with an increase in the porta hepatis lymph nodes from previous and new small bilateral pleural effusions right greater than left.  The liver metastasis was fairly stable.  There was a decrease in the cecal mass from previous. The exophytic right kidney nodule measured  approximately 2.3 cm, which was stable to improved.  A chest x-ray revealed a new mild opacity in the medial right lung base felt to be atelectasis versus infiltrate.  He was given IV fluids and discharged home on azithromycin for possible pneumonia.  He reports lightheadedness.  He reports palpitations the past few days. He denies chest pain.  He has atrial fibrillation, for which he is on carvedilol.   He denies cough.  He denies fevers or chills.  He denies pain.  His  appetite is fair.  His weight has decreased 4 pounds over last 2 weeks.  He continues iron TID, but his daughter feels it caused him diarrhea. He is accompanied by his wife and daughter today.  REVIEW OF SYSTEMS:  Review of Systems  Constitutional: Positive for appetite change, fatigue and unexpected weight change. Negative for chills and fever.  HENT:   Negative for lump/mass, mouth sores and sore throat.   Eyes: Negative for icterus.  Respiratory: Negative for cough and shortness of breath.   Cardiovascular: Positive for palpitations. Negative for chest pain and leg swelling.  Gastrointestinal: Negative for abdominal pain, blood in stool, constipation, diarrhea, nausea and vomiting.  Endocrine: Negative for hot flashes.  Genitourinary: Negative for difficulty urinating, dysuria, frequency and hematuria.   Musculoskeletal: Negative for arthralgias, back pain and myalgias.  Skin: Negative for itching, rash and wound.  Neurological: Negative for dizziness, extremity weakness, headaches, light-headedness and numbness.  Hematological: Negative for adenopathy. Does not bruise/bleed easily.  Psychiatric/Behavioral: Negative for depression. The patient is not nervous/anxious.      VITALS:  Vital signs today include a weight of 221.6 lb, blood pressure 75/47, pulse 45, respirations 18 and temperature 97.7.  Pulse oximetry is 94% on room air. Wt Readings from Last 3 Encounters:  11/28/20 221 lb 9.6 oz (100.5 kg)  11/14/20 225 lb 14.4 oz (102.5 kg)  11/05/20 227 lb 4 oz (103.1 kg)    There is no height or weight on file to calculate BMI.  Performance status (ECOG): 1 - Symptomatic but completely ambulatory  PHYSICAL EXAM:  Physical Exam Vitals and nursing note reviewed.  Constitutional:      General: He is not in acute distress.    Appearance: Normal appearance. He is not toxic-appearing.     Comments: He is generally  weak and can barely stand to transfer.  HENT:     Head: Normocephalic and atraumatic.     Mouth/Throat:     Mouth: Mucous membranes are moist.     Pharynx: Oropharynx is clear. No oropharyngeal exudate or posterior oropharyngeal erythema.  Eyes:     General: No scleral icterus.    Extraocular Movements: Extraocular movements intact.     Conjunctiva/sclera: Conjunctivae normal.     Pupils: Pupils are equal, round, and reactive to light.  Cardiovascular:     Rate and Rhythm: Normal rate. Rhythm irregular.     Pulses: Decreased pulses.     Heart sounds: Heart sounds are distant. No murmur heard. No friction rub. No gallop.   Pulmonary:     Effort: Pulmonary effort is normal. No respiratory distress.     Breath sounds: Normal breath sounds. No stridor. No wheezing, rhonchi or rales.  Abdominal:     General: Bowel sounds are normal. There is no distension.     Palpations: Abdomen is soft. There is no mass.     Tenderness: There is no abdominal tenderness. There is no guarding.  Musculoskeletal:  General: Normal range of motion.     Cervical back: Normal range of motion and neck supple.     Right lower leg: No edema.     Left lower leg: No edema.  Skin:    General: Skin is warm and dry.     Coloration: Skin is not jaundiced.     Findings: No rash.  Neurological:     Mental Status: He is alert and oriented to person, place, and time.     Cranial Nerves: No cranial nerve deficit.  Psychiatric:        Mood and Affect: Mood normal.        Behavior: Behavior normal.        Thought Content: Thought content normal.    LABS:    CBC Latest Ref Rng & Units 11/28/2020 11/14/2020 11/01/2020  WBC - 8.4 10.0 4.9  Hemoglobin 13.5 - 17.5 12.7(A) 11.4(A) 10.3(A)  Hematocrit 41 - 53 40(A) 35(A) 32(A)  Platelets 150 - 399 397 256 257   CMP Latest Ref Rng & Units 11/28/2020 11/14/2020 11/01/2020  Glucose 70 - 99 mg/dL - - -  BUN 4 - 21 18 29(A) 28(A)  Creatinine 0.6 - 1.3 1.1 1.0 1.3   Sodium 137 - 147 137 134(A) 136(A)  Potassium 3.4 - 5.3 4.9 4.1 4.2  Chloride 99 - 108 110(A) 105 108  CO2 13 - 22 20 21 16   Calcium 8.7 - 10.7 8.8 9.7 9.0  Total Protein 6.5 - 8.1 g/dL - - -  Total Bilirubin 0.3 - 1.2 mg/dL - - -  Alkaline Phos 25 - 125 340(A) 209(A) 234(A)  AST 14 - 40 40 31 37  ALT 10 - 40 24 17 22      Lab Results  Component Value Date   CEA1 12.9 (H) 11/01/2020   /  CEA  Date Value Ref Range Status  11/01/2020 12.9 (H) 0.0 - 4.7 ng/mL Final    Comment:    (NOTE)                             Nonsmokers          <3.9                             Smokers             <5.6 [employer] Electrochemiluminescence Immunoassay (ECLIA) Values obtained with different assay methods or kits cannot be used interchangeably.  Results cannot be interpreted as absolute evidence of the presence or absence of malignant disease. Performed At: [facility] [place], [place] [id number] Rush Farmer MD KU:[id number]       STUDIES:   Fraser Din Name:     NATANIEL GASPER           Department:    Patient ID:   [id number]               Room:          Gender:       Male                     Technician:   Craig Staggers DOB:          [birth year]               Requested By: Sherri Rad Order Number: [id number]  Reading MD:   Jenne Campus MD                                  Measurements Intervals                              Axis           Rate:         103                      P:             PR:                                    QRS:          11 QRSD:         88                       T:            -2 QT:           380                                     QTc:          497                                                                Interpretive Statements Atrial fibrillation with rapid ventricular response Nonspecific ST and T wave abnormality , probably digitalis effect Abnormal ECG Interpreted and Electronically Signed On 11-29-2020 9:58:19  EST by Jenne Campus MD    11/29/20 0958   11/29/20 0958 Dictated By: Jenne Campus J MD01/20/22 0958  Allergies: No Known Allergies  Current Medications: Current Outpatient Medications  Medication Sig Dispense Refill  . allopurinol (ZYLOPRIM) 100 MG tablet Take 100 mg by mouth daily.    Marland Kitchen aspirin EC 81 MG tablet Take 81 mg by mouth daily.    Marland Kitchen atorvastatin (LIPITOR) 20 MG tablet Take by mouth.    . Blood Glucose Monitoring Suppl (GLUCOCOM BLOOD GLUCOSE MONITOR) DEVI Check blood sugars once a day    . Blood Glucose Monitoring Suppl (ONETOUCH VERIO FLEX SYSTEM) w/Device KIT CHECK BLOOD SUGARS ONCE A DAY    . carvedilol (COREG) 12.5 MG tablet Take by mouth.    . cholecalciferol (VITAMIN D3) 25 MCG (1000 UT) tablet Take 2,000 Units by mouth daily.    Marland Kitchen escitalopram (LEXAPRO) 10 MG tablet Take 10 mg by mouth daily.    . ferrous sulfate 325 (65 FE) MG tablet Take 325 mg by mouth 2 (two) times daily with a meal.    . gabapentin (NEURONTIN) 100 MG capsule Take by mouth.    Marland Kitchen glipiZIDE (GLUCOTROL) 10 MG tablet Take 1 tablet in morning and 1/2 tablet in evening.    Marland Kitchen glucose blood (ONETOUCH VERIO) test strip Check blood sugars once a day    .  HYDROcodone-acetaminophen (NORCO/VICODIN) 5-325 MG tablet Take 1 tablet by mouth 3 (three) times daily as needed.    . Insulin Pen Needle (B-D UF III MINI PEN NEEDLES) 31G X 5 MM MISC Use as directed with victoza pens    . levofloxacin (LEVAQUIN) 750 MG tablet Take 750 mg by mouth daily.    . magic mouthwash w/lidocaine SOLN Take 5 mLs by mouth 4 (four) times daily as needed for mouth pain. 240 mL 0  . metFORMIN (GLUCOPHAGE) 500 MG tablet Take 1,000 mg by mouth 2 (two) times daily.    . mirtazapine (REMERON) 15 MG tablet Take 1 tablet (15 mg total) by mouth at bedtime. 30 tablet 3  . Multiple Vitamins-Minerals (CENTRUM SILVER 50+MEN PO) Take 1 tablet by mouth daily.    Marland Kitchen omeprazole (PRILOSEC) 40 MG capsule Take 40 mg by mouth daily.    . ondansetron  (ZOFRAN) 4 MG tablet Take 4 mg by mouth every 8 (eight) hours as needed for nausea or vomiting.    . ondansetron (ZOFRAN) 4 MG tablet Take 1 tablet (4 mg total) by mouth every 4 (four) hours as needed for nausea. 90 tablet 3  . OneTouch Delica Lancets 07P MISC CHECK BLOOD SUGAR EVERY DAY AS DIRECTED.    Marland Kitchen ONETOUCH VERIO test strip daily.    Dellia Nims CALCIUM + D3 500-200 MG-UNIT TABS Take 1 tablet by mouth every morning.    . prochlorperazine (COMPAZINE) 10 MG tablet Take 10 mg by mouth every 6 (six) hours as needed for nausea or vomiting.    . prochlorperazine (COMPAZINE) 10 MG tablet Take 1 tablet (10 mg total) by mouth every 6 (six) hours as needed for nausea or vomiting. 90 tablet 3  . rOPINIRole (REQUIP) 3 MG tablet Take by mouth.    . Semaglutide,0.25 or 0.5MG/DOS, (OZEMPIC, 0.25 OR 0.5 MG/DOSE,) 2 MG/1.5ML SOPN Inject 1 mg into the skin once a week.    . vitamin B-12 (CYANOCOBALAMIN) 1000 MCG tablet Take 1,000 mcg by mouth daily.     Current Facility-Administered Medications  Medication Dose Route Frequency Provider Last Rate Last Admin  . 0.9 %  sodium chloride infusion   Intravenous Once Rosanne Sack A, PA-C         ASSESSMENT & PLAN:   Assessment:   1. 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  We do not advise surgical resection or radiation.  MMR were normal. KRAS and PIK3CA were positive.  He has regional and distal nodal metastatic disease.   He tolerated his last cycle of palliative FOLFOX/bevacizumab poorly due to fatigue, general weakness and decreased appetite.  Recent CT imaging in the emergency room revealed mixed response with a decrease in his cecal mass and stable liver metastasis, but an increase of the porta hepatis lymphadenopathy, as well as new small bilateral pleural effusions.  2. Innumerable hypoattenuating masses throughout the liver consistent with metastatic disease, which appears fairly stable on most  recent CT imaging.  3. Iron deficiency anemia for which he is on oral iron supplement three times daily.  I told them they could decrease the iron to daily for now.  4. Exophytic nodule arising from the right kidney measuring 2.7 cm.  This has mildly enlarged since prior study, but has been present for the past 3 years.  This measured 2.3 cm on recent CT.  5. Complicated pain from issues with his spine, for which he sees pain management.  He is currently on  hydrocodone/APAP as needed.  6.  Numbness and paresthesias of his fingers, from prior spinal surgery.  This remains stable.   7. Possible early pneumonia on chest x-ray from January 8th, treated with azithromycin.  He is without symptoms of infection.  8. Hypotension likely due to dehydration. We will give him IV fluids today to see if we can avoid sending him to the emergency room.  His EKG is stable from previous.  We will arrange for him to have IV fluids as an outpatient again tomorrow.  We stressed the importance of him maintaining his fluid intake at home.  9.  Severe debilitation, we will arrange for home health and home physical therapy to monitor him and try strengthening exercises.  We stressed the importance of using nutritional supplements and/or eating several small meals a day in order to avoid further general decline.  Plan:    The patient has done poorly since his last cycle of FOLFOX/bevacizumab.  He has had a mixed response to chemotherapy, so we will discontinue FOLFOX/bevacizumab.  We don not feel he can tolerate any further aggressive chemotherapy. He is not eligible for EGFR targeted therapies due to his tumor being KRAS mutated. If he improves significantly, we may be able to consider treatment with an oral chemotherapy agent.  We also mentioned hospice care because if we are unable to give further treatment his prognosis is poor. We will plan to see him back in 1 week for continued supportive care. The patient and his  family understand the plans discussed today and are in agreement with them.  They know to contact our office if he develops issues during immediate clinical assessment.  The patient was seen and plan formulated with Dr. Hinton Rao.  We provided 40 minutes of face-to-face time during this this encounter and > 50% was spent counseling as documented under my assessment and plan.   Repeat blood pressure was 106/67 with a pulse of 96 and the patient was feeling better, so was discharged home.   Marvia Pickles, PA-C [facility] [address] [place] [address] Dept: [phone] Dept Fax: [phone]

## 2020-11-28 NOTE — Telephone Encounter (Signed)
Per 1/19 LOS, patient scheduled for 1/26 Labs, Follow Up.  Gave patient Appt Summary

## 2020-11-28 NOTE — Progress Notes (Signed)
Ralph Griffin. PA ordered 1 liter of NS saline over 1 hour.  Port accessed @ 1545, NS started @ 1555.  NS finished at 1652.  VS BP 106/67, P96, O2 Sat 98%. Port flush with heparin and left accessed for additional fluids tomorrow 11/30/2019 @ 2:30pm/

## 2020-11-29 ENCOUNTER — Other Ambulatory Visit: Payer: Self-pay | Admitting: Hematology and Oncology

## 2020-11-29 ENCOUNTER — Encounter: Payer: Self-pay | Admitting: Hematology and Oncology

## 2020-11-29 ENCOUNTER — Inpatient Hospital Stay: Payer: Medicare HMO

## 2020-11-29 VITALS — BP 106/84 | HR 85 | Temp 97.5°F | Resp 18

## 2020-11-29 DIAGNOSIS — E860 Dehydration: Secondary | ICD-10-CM

## 2020-11-29 DIAGNOSIS — E162 Hypoglycemia, unspecified: Secondary | ICD-10-CM | POA: Diagnosis not present

## 2020-11-29 DIAGNOSIS — R058 Other specified cough: Secondary | ICD-10-CM

## 2020-11-29 DIAGNOSIS — E11649 Type 2 diabetes mellitus with hypoglycemia without coma: Secondary | ICD-10-CM | POA: Diagnosis not present

## 2020-11-29 MED ORDER — SODIUM CHLORIDE 0.9 % IV SOLN
Freq: Once | INTRAVENOUS | Status: AC
  Filled 2020-11-29: qty 250

## 2020-11-29 MED ORDER — HEPARIN SOD (PORK) LOCK FLUSH 100 UNIT/ML IV SOLN
500.0000 [IU] | Freq: Once | INTRAVENOUS | Status: AC | PRN
  Administered 2020-11-29: 500 [IU]
  Filled 2020-11-29: qty 5

## 2020-11-29 MED ORDER — SODIUM CHLORIDE 0.9 % IV SOLN
Freq: Once | INTRAVENOUS | Status: DC
  Filled 2020-11-29: qty 250

## 2020-11-29 NOTE — Progress Notes (Signed)
After drinking water pt starts coughing, trying to clear his airway.  When asked how long this has been going on pt says "a couple weeks".  His son confirms that his Dad has been having trouble for about a week now.  Son and pt informed of risk aspiration pneumonia, take small sips of beverage.  Rosanne Sack, PA informed, new orders placed.

## 2020-12-01 ENCOUNTER — Other Ambulatory Visit: Payer: Self-pay

## 2020-12-01 ENCOUNTER — Emergency Department (HOSPITAL_COMMUNITY): Payer: Medicare HMO

## 2020-12-01 ENCOUNTER — Inpatient Hospital Stay (HOSPITAL_COMMUNITY)
Admission: EM | Admit: 2020-12-01 | Discharge: 2020-12-03 | DRG: 638 | Disposition: A | Discharge status: Home Health | Payer: Medicare HMO | Attending: Student | Admitting: Student

## 2020-12-01 ENCOUNTER — Encounter (HOSPITAL_COMMUNITY): Payer: Self-pay

## 2020-12-01 DIAGNOSIS — Z7984 Long term (current) use of oral hypoglycemic drugs: Secondary | ICD-10-CM

## 2020-12-01 DIAGNOSIS — Z6832 Body mass index (BMI) 32.0-32.9, adult: Secondary | ICD-10-CM | POA: Diagnosis not present

## 2020-12-01 DIAGNOSIS — C189 Malignant neoplasm of colon, unspecified: Secondary | ICD-10-CM | POA: Diagnosis present

## 2020-12-01 DIAGNOSIS — E162 Hypoglycemia, unspecified: Secondary | ICD-10-CM | POA: Diagnosis present

## 2020-12-01 DIAGNOSIS — E11649 Type 2 diabetes mellitus with hypoglycemia without coma: Principal | ICD-10-CM | POA: Diagnosis present

## 2020-12-01 DIAGNOSIS — Z20822 Contact with and (suspected) exposure to covid-19: Secondary | ICD-10-CM | POA: Diagnosis present

## 2020-12-01 DIAGNOSIS — E118 Type 2 diabetes mellitus with unspecified complications: Secondary | ICD-10-CM | POA: Diagnosis not present

## 2020-12-01 DIAGNOSIS — E440 Moderate protein-calorie malnutrition: Secondary | ICD-10-CM | POA: Diagnosis present

## 2020-12-01 DIAGNOSIS — E785 Hyperlipidemia, unspecified: Secondary | ICD-10-CM | POA: Diagnosis present

## 2020-12-01 DIAGNOSIS — D638 Anemia in other chronic diseases classified elsewhere: Secondary | ICD-10-CM | POA: Diagnosis present

## 2020-12-01 DIAGNOSIS — L8990 Pressure ulcer of unspecified site, unspecified stage: Secondary | ICD-10-CM | POA: Insufficient documentation

## 2020-12-01 DIAGNOSIS — F419 Anxiety disorder, unspecified: Secondary | ICD-10-CM | POA: Diagnosis present

## 2020-12-01 DIAGNOSIS — L89620 Pressure ulcer of left heel, unstageable: Secondary | ICD-10-CM | POA: Diagnosis present

## 2020-12-01 DIAGNOSIS — E860 Dehydration: Secondary | ICD-10-CM | POA: Diagnosis present

## 2020-12-01 DIAGNOSIS — T383X5A Adverse effect of insulin and oral hypoglycemic [antidiabetic] drugs, initial encounter: Secondary | ICD-10-CM | POA: Diagnosis present

## 2020-12-01 DIAGNOSIS — Z66 Do not resuscitate: Secondary | ICD-10-CM | POA: Diagnosis not present

## 2020-12-01 DIAGNOSIS — L89152 Pressure ulcer of sacral region, stage 2: Secondary | ICD-10-CM | POA: Diagnosis present

## 2020-12-01 DIAGNOSIS — E119 Type 2 diabetes mellitus without complications: Secondary | ICD-10-CM

## 2020-12-01 DIAGNOSIS — E160 Drug-induced hypoglycemia without coma: Secondary | ICD-10-CM | POA: Diagnosis not present

## 2020-12-01 DIAGNOSIS — R55 Syncope and collapse: Secondary | ICD-10-CM | POA: Diagnosis not present

## 2020-12-01 DIAGNOSIS — I4819 Other persistent atrial fibrillation: Secondary | ICD-10-CM | POA: Diagnosis present

## 2020-12-01 DIAGNOSIS — I5022 Chronic systolic (congestive) heart failure: Secondary | ICD-10-CM | POA: Diagnosis not present

## 2020-12-01 DIAGNOSIS — I110 Hypertensive heart disease with heart failure: Secondary | ICD-10-CM | POA: Diagnosis present

## 2020-12-01 DIAGNOSIS — L89312 Pressure ulcer of right buttock, stage 2: Secondary | ICD-10-CM

## 2020-12-01 DIAGNOSIS — R5381 Other malaise: Secondary | ICD-10-CM | POA: Diagnosis present

## 2020-12-01 DIAGNOSIS — G2581 Restless legs syndrome: Secondary | ICD-10-CM | POA: Diagnosis present

## 2020-12-01 DIAGNOSIS — Z9221 Personal history of antineoplastic chemotherapy: Secondary | ICD-10-CM | POA: Diagnosis not present

## 2020-12-01 DIAGNOSIS — T383X1A Poisoning by insulin and oral hypoglycemic [antidiabetic] drugs, accidental (unintentional), initial encounter: Secondary | ICD-10-CM | POA: Diagnosis not present

## 2020-12-01 DIAGNOSIS — L89302 Pressure ulcer of unspecified buttock, stage 2: Secondary | ICD-10-CM | POA: Diagnosis not present

## 2020-12-01 LAB — GLUCOSE, CAPILLARY
Glucose-Capillary: 121 mg/dL — ABNORMAL HIGH (ref 70–99)
Glucose-Capillary: 22 mg/dL — CL (ref 70–99)
Glucose-Capillary: 82 mg/dL (ref 70–99)

## 2020-12-01 LAB — COMPREHENSIVE METABOLIC PANEL
ALT: 16 U/L (ref 0–44)
AST: 25 U/L (ref 15–41)
Albumin: 1.9 g/dL — ABNORMAL LOW (ref 3.5–5.0)
Alkaline Phosphatase: 199 U/L — ABNORMAL HIGH (ref 38–126)
Anion gap: 7 (ref 5–15)
BUN: 18 mg/dL (ref 8–23)
CO2: 20 mmol/L — ABNORMAL LOW (ref 22–32)
Calcium: 7.6 mg/dL — ABNORMAL LOW (ref 8.9–10.3)
Chloride: 110 mmol/L (ref 98–111)
Creatinine, Ser: 1.05 mg/dL (ref 0.61–1.24)
GFR, Estimated: >60 mL/min (ref >60)
Glucose, Bld: 103 mg/dL — ABNORMAL HIGH (ref 70–99)
Potassium: 3.9 mmol/L (ref 3.5–5.1)
Sodium: 137 mmol/L (ref 135–145)
Total Bilirubin: 0.7 mg/dL (ref 0.3–1.2)
Total Protein: 5.5 g/dL — ABNORMAL LOW (ref 6.5–8.1)

## 2020-12-01 LAB — CBC
HCT: 32.8 % — ABNORMAL LOW (ref 39.0–52.0)
Hemoglobin: 10.1 g/dL — ABNORMAL LOW (ref 13.0–17.0)
MCH: 28.8 pg (ref 26.0–34.0)
MCHC: 30.8 g/dL (ref 30.0–36.0)
MCV: 93.4 fL (ref 80.0–100.0)
Platelets: 231 10*3/uL (ref 150–400)
RBC: 3.51 MIL/uL — ABNORMAL LOW (ref 4.22–5.81)
RDW: 19.4 % — ABNORMAL HIGH (ref 11.5–15.5)
WBC: 7.7 10*3/uL (ref 4.0–10.5)
nRBC: 0 % (ref 0.0–0.2)

## 2020-12-01 LAB — HEMOGLOBIN A1C
Hgb A1c MFr Bld: 6.8 % — ABNORMAL HIGH (ref 4.8–5.6)
Mean Plasma Glucose: 148.46 mg/dL

## 2020-12-01 LAB — URINALYSIS, ROUTINE W REFLEX MICROSCOPIC
Bilirubin Urine: NEGATIVE
Glucose, UA: NEGATIVE mg/dL
Hgb urine dipstick: NEGATIVE
Ketones, ur: NEGATIVE mg/dL
Leukocytes,Ua: NEGATIVE
Nitrite: NEGATIVE
Protein, ur: NEGATIVE mg/dL
Specific Gravity, Urine: 1.012 (ref 1.005–1.030)
pH: 5 (ref 5.0–8.0)

## 2020-12-01 LAB — CBG MONITORING, ED
Glucose-Capillary: 39 mg/dL — CL (ref 70–99)
Glucose-Capillary: 138 mg/dL — ABNORMAL HIGH (ref 70–99)
Glucose-Capillary: 71 mg/dL (ref 70–99)
Glucose-Capillary: 59 mg/dL — ABNORMAL LOW (ref 70–99)
Glucose-Capillary: 112 mg/dL — ABNORMAL HIGH (ref 70–99)

## 2020-12-01 LAB — GLUCOSE, RANDOM: Glucose, Bld: 174 mg/dL — ABNORMAL HIGH (ref 70–99)

## 2020-12-01 LAB — SARS CORONAVIRUS 2 BY RT PCR (HOSPITAL ORDER, PERFORMED IN ~~LOC~~ HOSPITAL LAB): SARS Coronavirus 2: NEGATIVE

## 2020-12-01 MED ORDER — ALLOPURINOL 100 MG PO TABS
100.0000 mg | ORAL_TABLET | Freq: Every day | ORAL | Status: DC
  Administered 2020-12-01 – 2020-12-03 (×3): 100 mg via ORAL
  Filled 2020-12-01 (×3): qty 1

## 2020-12-01 MED ORDER — ENSURE ENLIVE PO LIQD
237.0000 mL | Freq: Two times a day (BID) | ORAL | Status: DC
  Administered 2020-12-02 (×2): 237 mL via ORAL

## 2020-12-01 MED ORDER — GABAPENTIN 100 MG PO CAPS
100.0000 mg | ORAL_CAPSULE | Freq: Two times a day (BID) | ORAL | Status: DC
  Administered 2020-12-01 – 2020-12-03 (×4): 100 mg via ORAL
  Filled 2020-12-01 (×4): qty 1

## 2020-12-01 MED ORDER — ESCITALOPRAM OXALATE 10 MG PO TABS
10.0000 mg | ORAL_TABLET | Freq: Every day | ORAL | Status: DC
  Administered 2020-12-01 – 2020-12-03 (×3): 10 mg via ORAL
  Filled 2020-12-01 (×3): qty 1

## 2020-12-01 MED ORDER — SODIUM CHLORIDE 0.9 % IV SOLN: 250.0000 mL | INTRAVENOUS | Status: DC | PRN

## 2020-12-01 MED ORDER — SODIUM CHLORIDE 0.9% FLUSH: 3.0000 mL | INTRAVENOUS | Status: DC | PRN

## 2020-12-01 MED ORDER — DEXTROSE-NACL 5-0.45 % IV SOLN: Freq: Once | INTRAVENOUS | Status: DC

## 2020-12-01 MED ORDER — SODIUM CHLORIDE 0.9% FLUSH
3.0000 mL | Freq: Two times a day (BID) | INTRAVENOUS | Status: DC
  Administered 2020-12-01: 3 mL via INTRAVENOUS

## 2020-12-01 MED ORDER — DEXTROSE 50 % IV SOLN
1.0000 | Freq: Once | INTRAVENOUS | Status: AC
  Administered 2020-12-01: 50 mL via INTRAVENOUS
  Filled 2020-12-01: qty 50

## 2020-12-01 MED ORDER — ASPIRIN EC 81 MG PO TBEC
81.0000 mg | DELAYED_RELEASE_TABLET | Freq: Every day | ORAL | Status: DC
Start: 2020-12-01 — End: 2020-12-03
  Administered 2020-12-01 – 2020-12-03 (×3): 81 mg via ORAL
  Filled 2020-12-01 (×3): qty 1

## 2020-12-01 MED ORDER — ACETAMINOPHEN 325 MG PO TABS
650.0000 mg | ORAL_TABLET | Freq: Four times a day (QID) | ORAL | Status: DC | PRN
  Administered 2020-12-01: 650 mg via ORAL
  Filled 2020-12-01: qty 2

## 2020-12-01 MED ORDER — ATORVASTATIN CALCIUM 20 MG PO TABS
20.0000 mg | ORAL_TABLET | Freq: Every day | ORAL | Status: DC
Start: 2020-12-01 — End: 2020-12-03
  Administered 2020-12-01 – 2020-12-03 (×3): 20 mg via ORAL
  Filled 2020-12-01 (×3): qty 1

## 2020-12-01 MED ORDER — MAGIC MOUTHWASH W/LIDOCAINE
5.0000 mL | Freq: Four times a day (QID) | ORAL | Status: DC | PRN
  Filled 2020-12-01: qty 5

## 2020-12-01 MED ORDER — DEXTROSE 10 % IV SOLN: INTRAVENOUS | Status: DC

## 2020-12-01 MED ORDER — DEXTROSE 50 % IV SOLN
INTRAVENOUS | Status: AC
  Administered 2020-12-01: 50 mL via INTRAVENOUS
  Filled 2020-12-01: qty 50

## 2020-12-01 MED ORDER — MIRTAZAPINE 15 MG PO TABS
15.0000 mg | ORAL_TABLET | Freq: Every day | ORAL | Status: DC
  Administered 2020-12-01 – 2020-12-02 (×2): 15 mg via ORAL
  Filled 2020-12-01 (×2): qty 1

## 2020-12-01 MED ORDER — DEXTROSE 5 % IV SOLN: Freq: Once | INTRAVENOUS | Status: AC

## 2020-12-01 MED ORDER — SODIUM CHLORIDE 0.9% FLUSH: 3.0000 mL | Freq: Two times a day (BID) | INTRAVENOUS | Status: DC

## 2020-12-01 MED ORDER — PROCHLORPERAZINE MALEATE 10 MG PO TABS: 10.0000 mg | ORAL_TABLET | Freq: Four times a day (QID) | ORAL | Status: DC | PRN

## 2020-12-01 MED ORDER — DEXTROSE 5 % IV SOLN: Freq: Once | INTRAVENOUS | Status: DC

## 2020-12-01 MED ORDER — HYDROCODONE-ACETAMINOPHEN 5-325 MG PO TABS: 1.0000 | ORAL_TABLET | Freq: Three times a day (TID) | ORAL | Status: DC | PRN

## 2020-12-01 MED ORDER — ROPINIROLE HCL 1 MG PO TABS
3.0000 mg | ORAL_TABLET | Freq: Every day | ORAL | Status: DC
  Administered 2020-12-01 – 2020-12-02 (×2): 3 mg via ORAL
  Filled 2020-12-01 (×2): qty 3

## 2020-12-01 NOTE — ED Provider Notes (Signed)
[facility] DEPT Provider Note   CSN: [id number] Arrival date & time: 12/01/20  1028     History Chief Complaint  Patient presents with  . Hypoglycemia    Ralph Frazzini Sr. is a 80 y.o. 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  Per EMS, patient went to bed last night normal.  Family called EMS this morning because patient was unresponsive in bed.  No falls.  When EMS arrived his glucose was 20, they did give 250 mL of D10 and patient woke up.  They state that patient has been slowly arousing, is now currently at baseline.  Glucose did increase to 210, glucose now 110.  Patient states that he feels completely fine now.  States that he " had the best sleep of his life."  States that he took all his medications last night normally.  Patient states that he did eat a heavy dinner last night.  Felt fine last night.  States that he remembers going to bed last night, does not remember waking up.  Patient is currently on glipizide, metformin, Ozempic.  Patient states that he is not taking any insulin.  States that he took all these medications normally yesterday.  Denies any chest pain or shortness of breath.  Denies any confusion, neglect, weakness, numbness or tingling.  Denies any fevers or chills.  Denies any nausea or vomiting or abdominal pain.  States that he is in his normal health.  No other complaints at this time.   Per oncology note, patient did poorly on his last cycle of chemotherapy, has mixed response to chemotherapy therefore they discontinued his chemo 3 days ago.  Note did mention possibility of hospice.  HPI     Past Medical History:  Diagnosis Date  . Diabetes mellitus without complication ([place])   . Hypertension     Patient Active Problem List   Diagnosis Date Noted   . Hypotension 11/28/2020  . Dehydration 11/28/2020  . 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 Right kidney mass 06/03/2019  . Type 2 diabetes mellitus without complication ([place]) 19/41/7408  . Hypoglycemia 06/02/2019  . Cervical stenosis of spine 12/28/2018  . Myelopathy ([place]) 12/28/2018    Past Surgical History:  Procedure Laterality Date  . CERVICAL DISCECTOMY    . POSTERIOR CERVICAL LAMINECTOMY N/A 12/27/2018   Procedure: Decompressive Cervical Laminectomy and Posterior Cervical Fusion Cervical three-four, four-five, five-six;  Surgeon: Kary Kos, MD;  Location: [facility];  Service: Neurosurgery;  Laterality: N/A;       History reviewed. No pertinent family history.  Social History   Tobacco Use  . Smoking status: Never Smoker  . Smokeless tobacco: Never Used  Substance Use Topics  . Alcohol use: Not Currently    Home Medications Prior to Admission medications   Medication Sig Start Date End Date Taking? Authorizing Provider  allopurinol (ZYLOPRIM) 100 MG tablet Take 100 mg by mouth daily. 11/01/18  Yes [provider]  aspirin EC 81 MG tablet Take 81 mg by mouth daily.   Yes [provider]  atorvastatin (LIPITOR) 20 MG tablet Take 20 mg by mouth daily. 07/26/20  Yes [provider]  carvedilol (COREG) 12.5 MG tablet Take 12.5 mg by mouth 2 (two) times daily with a meal. 07/26/20  Yes [provider]  cholecalciferol (VITAMIN  D3) 25 MCG (1000 UT) tablet Take 2,000 Units by mouth daily.   Yes [provider]  escitalopram (LEXAPRO) 10 MG tablet Take 10 mg by mouth daily. 09/19/20  Yes [provider]  ferrous sulfate 325 (65 FE) MG tablet Take 325 mg by mouth daily with breakfast.   Yes [provider]  gabapentin (NEURONTIN) 100 MG capsule Take 100 mg by mouth 2 (two) times daily. 07/26/20  Yes [provider]  glipiZIDE (GLUCOTROL) 10 MG tablet Take 10 mg by  mouth 2 (two) times daily before a meal. 07/31/20  Yes [provider]  HYDROcodone-acetaminophen (NORCO/VICODIN) 5-325 MG tablet Take 1 tablet by mouth 3 (three) times daily as needed for moderate pain. 09/11/20  Yes [provider]  magic mouthwash w/lidocaine SOLN Take 5 mLs by mouth 4 (four) times daily as needed for mouth pain. 11/01/20  Yes Dayton Scrape A, NP  metFORMIN (GLUCOPHAGE) 500 MG tablet Take 1,000 mg by mouth 2 (two) times daily. 10/15/20  Yes [provider]  mirtazapine (REMERON) 15 MG tablet Take 1 tablet (15 mg total) by mouth at bedtime. 11/14/20  Yes Dayton Scrape A, NP  Multiple Vitamins-Minerals (CENTRUM SILVER 50+MEN PO) Take 1 tablet by mouth daily.   Yes [provider]  ondansetron (ZOFRAN) 4 MG tablet Take 1 tablet (4 mg total) by mouth every 4 (four) hours as needed for nausea. 11/15/20  Yes Dayton Scrape A, NP  prochlorperazine (COMPAZINE) 10 MG tablet Take 1 tablet (10 mg total) by mouth every 6 (six) hours as needed for nausea or vomiting. 11/14/20  Yes Dayton Scrape A, NP  rOPINIRole (REQUIP) 3 MG tablet Take 3 mg by mouth at bedtime. 07/26/20  Yes [provider]  Semaglutide,0.25 or 0.5MG/DOS, (OZEMPIC, 0.25 OR 0.5 MG/DOSE,) 2 MG/1.5ML SOPN Inject 1 mg into the skin once a week. Friday   Yes [provider]  vitamin B-12 (CYANOCOBALAMIN) 1000 MCG tablet Take 1,000 mcg by mouth daily.   Yes [provider]  Blood Glucose Monitoring Suppl (GLUCOCOM BLOOD GLUCOSE MONITOR) DEVI Check blood sugars once a day 08/01/20   [provider]  Blood Glucose Monitoring Suppl ([place]) w/Device KIT CHECK BLOOD SUGARS ONCE A DAY 08/01/20   [provider]  glucose blood (ONETOUCH VERIO) test strip Check blood sugars once a day 08/01/20   [provider]  Insulin Pen Needle (B-D UF III MINI PEN NEEDLES) 31G X 5 MM MISC Use as directed with victoza pens 12/14/19   [provider]  omeprazole (PRILOSEC) 40 MG capsule Take 40 mg by mouth daily. 10/18/20   [provider]  OneTouch Delica Lancets 38G MISC CHECK BLOOD SUGAR EVERY DAY AS DIRECTED. 12/13/19   [provider]  [facility] VERIO test strip daily. 08/01/20   [provider]  prochlorperazine (COMPAZINE) 10 MG tablet Take 10 mg by mouth every 6 (six) hours as needed for nausea or vomiting. Patient not taking: No sig reported    [provider]    Allergies    Patient has no known allergies.  Review of Systems   Review of Systems  Constitutional: Negative for chills, diaphoresis, fatigue and fever.  HENT: Negative for congestion, sore throat and trouble swallowing.   Eyes: Negative for pain and visual disturbance.  Respiratory: Negative for cough, shortness of breath and wheezing.   Cardiovascular: Negative for chest pain, palpitations and leg swelling.  Gastrointestinal: Negative for abdominal distention, abdominal pain, diarrhea, nausea and  vomiting.  Genitourinary: Negative for difficulty urinating.  Musculoskeletal: Negative for back pain, neck pain and neck stiffness.  Skin: Negative for pallor.  Neurological: Negative for dizziness, speech difficulty, weakness and headaches.  Psychiatric/Behavioral: Negative for confusion.    Physical Exam Updated Vital Signs BP 108/65   Pulse 70   Temp 97.6 F (36.4 C) (Oral)   Resp 20   SpO2 100%   Physical Exam Constitutional:      General: He is not in acute distress.    Appearance: Normal appearance. He is not ill-appearing, toxic-appearing or diaphoretic.  HENT:     Mouth/Throat:     Mouth: Mucous membranes are moist.     Pharynx: Oropharynx is clear.  Eyes:     General: No scleral icterus.    Extraocular Movements: Extraocular movements intact.     Pupils: Pupils are equal, round, and reactive to light.  Cardiovascular:     Rate and Rhythm: Normal rate and regular rhythm.     Pulses: Normal pulses.      Heart sounds: Normal heart sounds.  Pulmonary:     Effort: Pulmonary effort is normal. No respiratory distress.     Breath sounds: Normal breath sounds. No stridor. No wheezing, rhonchi or rales.  Chest:     Chest wall: No tenderness.  Abdominal:     General: Abdomen is flat. There is no distension.     Palpations: Abdomen is soft.     Tenderness: There is no abdominal tenderness. There is no guarding or rebound.  Musculoskeletal:        General: No swelling or tenderness. Normal range of motion.     Cervical back: Normal range of motion and neck supple. No rigidity.     Right lower leg: No edema.     Left lower leg: No edema.  Skin:    General: Skin is warm and dry.     Capillary Refill: Capillary refill takes less than 2 seconds.     Coloration: Skin is not pale.  Neurological:     General: No focal deficit present.     Mental Status: He is alert and oriented to person, place, and time.     Comments: Alert. Clear speech. No facial droop. CNIII-XII grossly intact. Bilateral upper and lower extremities' sensation grossly intact. 5/5 symmetric strength with grip strength and with plantar and dorsi flexion bilaterally.Normal finger to nose bilaterally. Negative pronator drift.    Psychiatric:        Mood and Affect: Mood normal.        Behavior: Behavior normal.     ED Results / Procedures / Treatments   Labs (all labs ordered are listed, but only abnormal results are displayed) Labs Reviewed  COMPREHENSIVE METABOLIC PANEL - Abnormal; Notable for the following components:      Result Value   CO2 20 (*)    Glucose, Bld 103 (*)    Calcium 7.6 (*)    Total Protein 5.5 (*)    Albumin 1.9 (*)    Alkaline Phosphatase 199 (*)    All other components within normal limits  CBC - Abnormal; Notable for the following components:   RBC 3.51 (*)    Hemoglobin 10.1 (*)    HCT 32.8 (*)    RDW 19.4 (*)    All other components within normal limits  CBG MONITORING, ED - Abnormal;  Notable for the following components:   Glucose-Capillary 39 (*)    All other components within normal limits  CBG MONITORING, ED - Abnormal; Notable for the following components:   Glucose-Capillary 138 (*)    All other components within normal limits  CBG MONITORING, ED - Abnormal; Notable for the following components:   Glucose-Capillary 59 (*)    All other components within normal limits  CBG MONITORING, ED - Abnormal; Notable for the following components:   Glucose-Capillary 112 (*)    All other components within normal limits  SARS CORONAVIRUS 2 BY RT PCR (HOSPITAL ORDER, [facility])  URINALYSIS, ROUTINE W REFLEX MICROSCOPIC  CBG MONITORING, ED    EKG None  Radiology CT Head Wo Contrast  Result Date: 12/01/2020 CLINICAL DATA:  80 year old male with history of mental status change. EXAM: CT HEAD WITHOUT CONTRAST TECHNIQUE: Contiguous axial images were obtained from the base of the skull through the vertex without intravenous contrast. COMPARISON:  Head CT 06/02/2019. FINDINGS: Brain: Mild cerebral atrophy. Patchy areas of decreased attenuation are noted throughout the deep and periventricular white matter of the cerebral hemispheres bilaterally, compatible with mild chronic microvascular ischemic disease. No evidence of acute infarction, hemorrhage, hydrocephalus, extra-axial collection or mass lesion/mass effect. Vascular: No hyperdense vessel or unexpected calcification. Skull: Normal. Negative for fracture or focal lesion. Sinuses/Orbits: No acute finding. Other: None. IMPRESSION: 1. No acute intracranial abnormalities. 2. Mild cerebral atrophy with mild chronic microvascular ischemic changes in the cerebral white matter, as above. Electronically Signed   By: Vinnie Langton M.D.   On: 12/01/2020 13:53    Procedures .Critical Care Performed by: Alfredia Client, PA-C Authorized by: Alfredia Client, PA-C   Critical care provider statement:    Critical  care time (minutes):  45   Critical care was time spent personally by me on the following activities:  Discussions with consultants, evaluation of patient's response to treatment, examination of patient, ordering and performing treatments and interventions, ordering and review of laboratory studies, ordering and review of radiographic studies, pulse oximetry, re-evaluation of patient's condition, obtaining history from patient or surrogate and review of old charts   (including critical care time)  Medications Ordered in ED Medications  dextrose 50 % solution 50 mL (50 mLs Intravenous Given 12/01/20 1120)  dextrose 5 % solution ( Intravenous Stopped 12/01/20 1301)  dextrose 50 % solution 50 mL (50 mLs Intravenous Given 12/01/20 1305)  dextrose 5 % solution ( Intravenous New Bag/Given (Non-Interop) 12/01/20 1301)    ED Course  I have reviewed the triage vital signs and the nursing notes.  Pertinent labs & imaging results that were available during my care of the patient were reviewed by me and considered in my medical decision making (see chart for details).    MDM Rules/Calculators/A&P                         Ralph Rainwater Sr. is a 80 y.o. 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  Patient was found unresponsive by EMS, glucose 20, patient did respond well to D10, sugar currently 110.  Patient is alert and oriented with normal neuro exam currently.  States that he feels fine.  Will observe patient in the emergency department, get repeat glucose and blood work.  Patient is on sulfonylurea and GLP-1 in addition to metformin.  No insulin.  No intentional /unintentional overdose.  Was notified by nursing that patient's glucose dropped down  to 39, will give amp of D50 at this time.  Normal neuro exam.  Patient will need to be  admitted for continuous hypoglycemia, will put on D5 drip at this time.  Patient has been on D5 drip for over 45 minutes at this time, glucose 59.  Will give another amp of D50 at this time and turned rate up on D5 infusion.  Patient will need to be admitted at this time.  Unsure why patient is constantly hypoglycemic, no accidental overdose.  Urinalysis clean, CBC without any leukocytosis, hemoglobin 10.1.  Patient's baseline is around 10-12. CT head pending.   Spoke to Dr. Marylyn Ishihara,  hospitalist who accept the patient. Repeat glucose 112, CT head without any acute intracranial abnormality.  The patient appears reasonably stabilized for admission considering the current resources, flow, and capabilities available in the ED at this time, and I doubt any other [facility] requiring further screening and/or treatment in the ED prior to admission.  I discussed this case with my attending physician who cosigned this note including patient's presenting symptoms, physical exam, and planned diagnostics and interventions. Attending physician stated agreement with plan or made changes to plan which were implemented.   Attending physician assessed patient at bedside.   Final Clinical Impression(s) / ED Diagnoses Final diagnoses:  Hypoglycemia    Rx / DC Orders ED Discharge Orders    None       Alfredia Client, PA-C 12/01/20 1454    Pattricia Boss, MD 12/02/20 1511

## 2020-12-01 NOTE — ED Triage Notes (Addendum)
[facility] transported pt from home and reports the following:  On scene pt glucose 21. Hx diabetes. Pt took ozempic last night. Went to bed at 10 PM. Family unable to arouse him around 9 AM this morning. EMS gave 250 mL D10 and pt woke up. Glucose increased to 210. Last glucose 110 at 10:30 AM. Hx history afib, htn, end stage colon cancer w mets. 12-lead indicates afib.

## 2020-12-01 NOTE — ED Notes (Signed)
ED TO INPATIENT HANDOFF REPORT  Name/Age/Gender Ralph Griffin. 80 y.o. male  Code Status Code Status History    Date Active Date Inactive Code Status Order ID Comments User Context   06/02/2019 2117 06/03/2019 1920 Full Code [id number]  Bonnell Public, MD ED   12/28/2018 0316 12/31/2018 1913 Full Code [id number]  Kary Kos, MD Inpatient   Advance Care Planning Activity    Questions for Most Recent Historical Code Status (Order [id number])       Home/SNF/Other Home  Chief Complaint Hypoglycemia [E16.2]  Level of Care/Admitting Diagnosis ED Disposition    ED Disposition Condition [facility] [[id number]]  Level of Care: Telemetry [5]  Admit to tele based on following criteria: Monitor for Ischemic changes  Covid Evaluation: Asymptomatic Screening Protocol (No Symptoms)  Diagnosis: Hypoglycemia [882800]  Admitting Physician: Jonnie Finner [[id number]]  Attending Physician: Jonnie Finner [[id number]]       Medical History Past Medical History:  Diagnosis Date  . Diabetes mellitus without complication ([place])   . Hypertension     Allergies No Known Allergies  IV Location/Drains/Wounds Patient Lines/Drains/Airways Status    Active Line/Drains/Airways    Name Placement date Placement time Site Days   Implanted Port 09/03/20 Right Chest 09/03/20  --  Chest  89   Peripheral IV 12/01/20 Anterior;Right Hand 12/01/20  1048  Hand  less than 1          Labs/Imaging Results for orders placed or performed during the hospital encounter of 12/01/20 (from the past 48 hour(s))  CBG monitoring, ED (now and then every hour for 3 hours)     Status: Abnormal   Collection Time: 12/01/20 11:09 AM  Result Value Ref Range   Glucose-Capillary 39 (LL) 70 - 99 mg/dL    Comment: Glucose reference range applies only to samples taken after fasting for at least 8 hours.   Comment 1 Notify RN   CBG monitoring, ED (now and then every hour for  3 hours)     Status: Abnormal   Collection Time: 12/01/20 11:34 AM  Result Value Ref Range   Glucose-Capillary 138 (H) 70 - 99 mg/dL    Comment: Glucose reference range applies only to samples taken after fasting for at least 8 hours.  Comprehensive metabolic panel     Status: Abnormal   Collection Time: 12/01/20 12:01 PM  Result Value Ref Range   Sodium 137 135 - 145 mmol/L   Potassium 3.9 3.5 - 5.1 mmol/L   Chloride 110 98 - 111 mmol/L   CO2 20 (L) 22 - 32 mmol/L   Glucose, Bld 103 (H) 70 - 99 mg/dL    Comment: Glucose reference range applies only to samples taken after fasting for at least 8 hours.   BUN 18 8 - 23 mg/dL   Creatinine, Ser 1.05 0.61 - 1.24 mg/dL   Calcium 7.6 (L) 8.9 - 10.3 mg/dL   Total Protein 5.5 (L) 6.5 - 8.1 g/dL   Albumin 1.9 (L) 3.5 - 5.0 g/dL   AST 25 15 - 41 U/L   ALT 16 0 - 44 U/L   Alkaline Phosphatase 199 (H) 38 - 126 U/L   Total Bilirubin 0.7 0.3 - 1.2 mg/dL   GFR, Estimated >60 >60 mL/min    Comment: (NOTE) Calculated using the CKD-EPI Creatinine Equation (2021)    Anion gap 7 5 - 15    Comment: Performed at [facility], [address] [place], [place] [address]  CBC     Status: Abnormal   Collection Time: 12/01/20 12:01 PM  Result Value Ref Range   WBC 7.7 4.0 - 10.5 K/uL   RBC 3.51 (L) 4.22 - 5.81 MIL/uL   Hemoglobin 10.1 (L) 13.0 - 17.0 g/dL   HCT 32.8 (L) 39.0 - 52.0 %   MCV 93.4 80.0 - 100.0 fL   MCH 28.8 26.0 - 34.0 pg   MCHC 30.8 30.0 - 36.0 g/dL   RDW 19.4 (H) 11.5 - 15.5 %   Platelets 231 150 - 400 K/uL   nRBC 0.0 0.0 - 0.2 %    Comment: Performed at [facility], [place] [address], [place], [place] [address]  Urinalysis, Routine w reflex microscopic Urine, Clean Catch     Status: None   Collection Time: 12/01/20 12:01 PM  Result Value Ref Range   Color, Urine YELLOW YELLOW   APPearance CLEAR CLEAR   Specific Gravity, Urine 1.012 1.005 - 1.030   pH 5.0 5.0 - 8.0   Glucose, UA NEGATIVE  NEGATIVE mg/dL   Hgb urine dipstick NEGATIVE NEGATIVE   Bilirubin Urine NEGATIVE NEGATIVE   Ketones, ur NEGATIVE NEGATIVE mg/dL   Protein, ur NEGATIVE NEGATIVE mg/dL   Nitrite NEGATIVE NEGATIVE   Leukocytes,Ua NEGATIVE NEGATIVE    Comment: Performed at [place] [address], [place], [place] [address]  SARS Coronavirus 2 by RT PCR (hospital order, performed in [facility] hospital lab) Nasopharyngeal Nasopharyngeal Swab     Status: None   Collection Time: 12/01/20 12:02 PM   Specimen: Nasopharyngeal Swab  Result Value Ref Range   SARS Coronavirus 2 NEGATIVE NEGATIVE    Comment: (NOTE) SARS-CoV-2 target nucleic acids are NOT DETECTED.  The SARS-CoV-2 RNA is generally detectable in upper and lower respiratory specimens during the acute phase of infection. The lowest concentration of SARS-CoV-2 viral copies this assay can detect is 250 copies / mL. A negative result does not preclude SARS-CoV-2 infection and should not be used as the sole basis for treatment or other patient management decisions.  A negative result may occur with improper specimen collection / handling, submission of specimen other than nasopharyngeal swab, presence of viral mutation(s) within the areas targeted by this assay, and inadequate number of viral copies (<250 copies / mL). A negative result must be combined with clinical observations, patient history, and epidemiological information.  Fact Sheet for Patients:   StrictlyIdeas.no  Fact Sheet for Healthcare Providers: BankingDealers.co.za  This test is not yet approved or  cleared by the [place] FDA and has been authorized for detection and/or diagnosis of SARS-CoV-2 by FDA under an Emergency Use Authorization (EUA).  This EUA will remain in effect (meaning this test can be used) for the duration of the COVID-19 declaration under Section 564(b)(1) of the Act, 21 U.S.C. section  360bbb-3(b)(1), unless the authorization is terminated or revoked sooner.  Performed at [facility], [place] [address] [place], [place] [address]   CBG monitoring, ED (now and then every hour for 3 hours)     Status: None   Collection Time: 12/01/20 12:16 PM  Result Value Ref Range   Glucose-Capillary 71 70 - 99 mg/dL    Comment: Glucose reference range applies only to samples taken after fasting for at least 8 hours.  POC CBG, ED     Status: Abnormal   Collection Time: 12/01/20 12:46 PM  Result Value Ref Range   Glucose-Capillary  59 (L) 70 - 99 mg/dL    Comment: Glucose reference range applies only to samples taken after fasting for at least 8 hours.  CBG monitoring, ED     Status: Abnormal   Collection Time: 12/01/20  1:44 PM  Result Value Ref Range   Glucose-Capillary 112 (H) 70 - 99 mg/dL    Comment: Glucose reference range applies only to samples taken after fasting for at least 8 hours.   Comment 1 Notify RN    CT Head Wo Contrast  Result Date: 12/01/2020 CLINICAL DATA:  80 year old male with history of mental status change. EXAM: CT HEAD WITHOUT CONTRAST TECHNIQUE: Contiguous axial images were obtained from the base of the skull through the vertex without intravenous contrast. COMPARISON:  Head CT 06/02/2019. FINDINGS: Brain: Mild cerebral atrophy. Patchy areas of decreased attenuation are noted throughout the deep and periventricular white matter of the cerebral hemispheres bilaterally, compatible with mild chronic microvascular ischemic disease. No evidence of acute infarction, hemorrhage, hydrocephalus, extra-axial collection or mass lesion/mass effect. Vascular: No hyperdense vessel or unexpected calcification. Skull: Normal. Negative for fracture or focal lesion. Sinuses/Orbits: No acute finding. Other: None. IMPRESSION: 1. No acute intracranial abnormalities. 2. Mild cerebral atrophy with mild chronic microvascular ischemic changes in the cerebral white matter,  as above. Electronically Signed   By: Vinnie Langton M.D.   On: 12/01/2020 13:53    Pending Labs Unresulted Labs (From admission, onward)         None      Vitals/Pain Today's Vitals   12/01/20 1146 12/01/20 1200 12/01/20 1230 12/01/20 1300  BP:  106/73 106/64 108/65  Pulse:  85 87 70  Resp:  16 (!) 22 20  Temp:      TempSrc:      SpO2:  100% 99% 100%  PainSc: 0-No pain       Isolation Precautions Airborne and Contact precautions  Medications Medications  dextrose 50 % solution 50 mL (50 mLs Intravenous Given 12/01/20 1120)  dextrose 5 % solution ( Intravenous Stopped 12/01/20 1301)  dextrose 50 % solution 50 mL (50 mLs Intravenous Given 12/01/20 1305)  dextrose 5 % solution ( Intravenous New Bag/Given (Non-Interop) 12/01/20 1301)    Mobility walks with device

## 2020-12-01 NOTE — ED Notes (Signed)
Pt transported to CT ?

## 2020-12-01 NOTE — H&P (Signed)
History and Physical    Ralph Lynch Sr. TYO:[id number] DOB: 03/25/1941 DOA: 12/01/2020  PCP: Ralph Mina., MD  Patient coming from: Home  Chief Complaint: Hypoglycemia  HPI: Ralph Rainwater Sr. is a 80 y.o. male with medical history significant of S4 adenocarcinoma of the colon, a fib, DM2. Presenting with hypoglycemia. The patient states  he's been eating normally the last several days. He went to sleep at 9pm last night. He woke up with his family all around him trying to wake him up. He says he's felt fine the whole time. No symptoms. However, after discussing with his daughter, she says he was up at 4am this morning to go to the bathroom, but seemed off. He went back to sleep. At [place], family found him gurgling and he was unresponsive. EMS was called. They came out and found his glucose down to 20. They brought him to the ED. She reports that heseems to have had several problems since his last chemo session 3 weeks ago. He stays dehydrated and has need several IVF infusions w/ his oncologist. Chemo has since been stopped.   ED Course: Was placed on D5 gtt and given an amp of D50. His glucose continued to drift down, so he was given another amp of D50. [facility] was called for admission.   Review of Systems:  Denies CP, palpitations, N/V/F/D. Review of systems is otherwise negative for all not mentioned in HPI.   PMHx Past Medical History:  Diagnosis Date  . Diabetes mellitus without complication ([place])   . Hypertension     PSHx Past Surgical History:  Procedure Laterality Date  . CERVICAL DISCECTOMY    . POSTERIOR CERVICAL LAMINECTOMY N/A 12/27/2018   Procedure: Decompressive Cervical Laminectomy and Posterior Cervical Fusion Cervical three-four, four-five, five-six;  Surgeon: Kary Kos, MD;  Location: [facility];  Service: Neurosurgery;  Laterality: N/A;    SocHx  reports that he has never smoked. He has never used smokeless tobacco. He reports previous alcohol use. No history on file for  drug use.  No Known Allergies  FamHx History reviewed. No pertinent family history.  Prior to Admission medications   Medication Sig Start Date End Date Taking? Authorizing Provider  allopurinol (ZYLOPRIM) 100 MG tablet Take 100 mg by mouth daily. 11/01/18   [provider]  aspirin EC 81 MG tablet Take 81 mg by mouth daily.    [provider]  atorvastatin (LIPITOR) 20 MG tablet Take by mouth. 07/26/20   [provider]  Blood Glucose Monitoring Suppl (GLUCOCOM BLOOD GLUCOSE MONITOR) DEVI Check blood sugars once a day 08/01/20   [provider]  Blood Glucose Monitoring Suppl ([place]) w/Device KIT CHECK BLOOD SUGARS ONCE A DAY 08/01/20   [provider]  carvedilol (COREG) 12.5 MG tablet Take by mouth. 07/26/20   [provider]  cholecalciferol (VITAMIN D3) 25 MCG (1000 UT) tablet Take 2,000 Units by mouth daily.    [provider]  escitalopram (LEXAPRO) 10 MG tablet Take 10 mg by mouth daily. 09/19/20   [provider]  ferrous sulfate 325 (65 FE) MG tablet Take 325 mg by mouth 2 (two) times daily with a meal.    [provider]  gabapentin (NEURONTIN) 100 MG capsule Take by mouth. 07/26/20   [provider]  glipiZIDE (GLUCOTROL) 10 MG tablet Take 1 tablet in morning and 1/2 tablet in evening. 07/31/20   [provider]  glucose blood (ONETOUCH VERIO) test strip Check blood  sugars once a day 08/01/20   [provider]  HYDROcodone-acetaminophen (NORCO/VICODIN) 5-325 MG tablet Take 1 tablet by mouth 3 (three) times daily as needed. 09/11/20   [provider]  Insulin Pen Needle (B-D UF III MINI PEN NEEDLES) 31G X 5 MM MISC Use as directed with victoza pens 12/14/19   [provider]  levofloxacin (LEVAQUIN) 750 MG tablet Take 750 mg by mouth daily. 10/28/20   [provider]  magic mouthwash w/lidocaine SOLN Take 5 mLs by mouth 4 (four) times  daily as needed for mouth pain. 11/01/20   Dayton Scrape A, NP  metFORMIN (GLUCOPHAGE) 500 MG tablet Take 1,000 mg by mouth 2 (two) times daily. 10/15/20   [provider]  mirtazapine (REMERON) 15 MG tablet Take 1 tablet (15 mg total) by mouth at bedtime. 11/14/20   Melodye Ped, NP  Multiple Vitamins-Minerals (CENTRUM SILVER 50+MEN PO) Take 1 tablet by mouth daily.    [provider]  omeprazole (PRILOSEC) 40 MG capsule Take 40 mg by mouth daily. 10/18/20   [provider]  ondansetron (ZOFRAN) 4 MG tablet Take 4 mg by mouth every 8 (eight) hours as needed for nausea or vomiting.    [provider]  ondansetron (ZOFRAN) 4 MG tablet Take 1 tablet (4 mg total) by mouth every 4 (four) hours as needed for nausea. 11/15/20   Melodye Ped, NP  OneTouch Delica Lancets 38S MISC CHECK BLOOD SUGAR EVERY DAY AS DIRECTED. 12/13/19   [provider]  [facility] VERIO test strip daily. 08/01/20   [provider]  OS-CAL CALCIUM + D3 500-200 MG-UNIT TABS Take 1 tablet by mouth every morning. 09/24/18   [provider]  prochlorperazine (COMPAZINE) 10 MG tablet Take 10 mg by mouth every 6 (six) hours as needed for nausea or vomiting.    [provider]  prochlorperazine (COMPAZINE) 10 MG tablet Take 1 tablet (10 mg total) by mouth every 6 (six) hours as needed for nausea or vomiting. 11/14/20   Dayton Scrape A, NP  rOPINIRole (REQUIP) 3 MG tablet Take by mouth. 07/26/20   [provider]  Semaglutide,0.25 or 0.5MG/DOS, (OZEMPIC, 0.25 OR 0.5 MG/DOSE,) 2 MG/1.5ML SOPN Inject 1 mg into the skin once a week.    [provider]  vitamin B-12 (CYANOCOBALAMIN) 1000 MCG tablet Take 1,000 mcg by mouth daily.    [provider]    Physical Exam: Vitals:   12/01/20 1130 12/01/20 1200 12/01/20 1230 12/01/20 1300  BP: 115/64 106/73 106/64 108/65  Pulse: 81 85 87 70  Resp: (!) 24 16 (!) 22 20  Temp:      TempSrc:       SpO2: 100% 100% 99% 100%    General: 80 y.o. male resting in bed in NAD Eyes: PERRL, normal sclera ENMT: Nares patent w/o discharge, orophaynx clear, dentition normal, ears w/o discharge/lesions/ulcers Neck: Supple, trachea midline Cardiovascular: RRR, +S1, S2, no m/g/r, equal pulses throughout Respiratory: CTABL, no w/r/r, normal WOB GI: BS+, NDNT, no masses noted, no organomegaly noted MSK: No e/c/c Skin: No rashes, bruises, ulcerations noted Neuro: A&O x 3, no focal deficits, hard of hearing Psyc: Appropriate interaction and affect, calm/cooperative  Labs on Admission: I have personally reviewed following labs and imaging studies  CBC: Recent Labs  Lab 11/28/20 0000 12/01/20 1201  WBC 8.4 7.7  NEUTROABS 6.64  --   HGB 12.7* 10.1*  HCT 40* 32.8*  MCV  --  93.4  PLT 397 231  Basic Metabolic Panel: Recent Labs  Lab 11/28/20 0000 12/01/20 1201  NA 137 137  K 4.9 3.9  CL 110* 110  CO2 20 20*  GLUCOSE  --  103*  BUN 18 18  CREATININE 1.1 1.05  CALCIUM 8.8 7.6*   GFR: Estimated Creatinine Clearance: 68.3 mL/min (by C-G formula based on SCr of 1.05 mg/dL). Liver Function Tests: Recent Labs  Lab 11/28/20 0000 12/01/20 1201  AST 40 25  ALT 24 16  ALKPHOS 340* 199*  BILITOT  --  0.7  PROT  --  5.5*  ALBUMIN 3.1* 1.9*   No results for input(s): LIPASE, AMYLASE in the last 168 hours. No results for input(s): AMMONIA in the last 168 hours. Coagulation Profile: No results for input(s): INR, PROTIME in the last 168 hours. Cardiac Enzymes: No results for input(s): CKTOTAL, CKMB, CKMBINDEX, TROPONINI in the last 168 hours. BNP (last 3 results) No results for input(s): PROBNP in the last 8760 hours. HbA1C: No results for input(s): HGBA1C in the last 72 hours. CBG: Recent Labs  Lab 12/01/20 1109 12/01/20 1134 12/01/20 1216 12/01/20 1246  GLUCAP 39* 138* 71 59*   Lipid Profile: No results for input(s): CHOL, HDL, LDLCALC, TRIG, CHOLHDL, LDLDIRECT in the  last 72 hours. Thyroid Function Tests: No results for input(s): TSH, T4TOTAL, FREET4, T3FREE, THYROIDAB in the last 72 hours. Anemia Panel: No results for input(s): VITAMINB12, FOLATE, FERRITIN, TIBC, IRON, RETICCTPCT in the last 72 hours. Urine analysis:    Component Value Date/Time   COLORURINE YELLOW 12/01/2020 [place] 12/01/2020 1201   LABSPEC 1.012 12/01/2020 1201   PHURINE 5.0 12/01/2020 1201   GLUCOSEU NEGATIVE 12/01/2020 1201   HGBUR NEGATIVE 12/01/2020 1201   BILIRUBINUR NEGATIVE 12/01/2020 1201   KETONESUR NEGATIVE 12/01/2020 1201   PROTEINUR NEGATIVE 12/01/2020 1201   NITRITE NEGATIVE 12/01/2020 [place] 12/01/2020 1201    Radiological Exams on Admission: No results found.  EKG: Independently reviewed. A fib, no st elevation  Assessment/Plan Syncope Hypoglycemia     - admit to obs, tele     - syncope likely secondary to hypoglycemia     - hypoglycemia etiology unknown; family says that he hasn't been himself since his last chemo treatment and that he stay dehydrated     - D5W, check q4h glucose, hold DM meds for now, add DM diet     - check echo  A fib HTN     - pressures are soft right now, holding coreg; he is not on AC at baseline  HLD     - continue atrovastatin  Stage 4 adenocarcinoma of the colon     - continue outpt follow up     - oncology notifid that he is here.  RLS     - continue requip  DM2     - q4h glucose check, DM diet  Anxiety      - continue lexapro  DVT prophylaxis: SCDs  Code Status: FULL  Family Communication: with dtr by phone  Consults called: None  Status is: Observation  The patient remains OBS appropriate and will d/c before 2 midnights.  Dispo: The patient is from: Home              Anticipated d/c is to: Home              Anticipated d/c date is: 1 day              Patient currently is  not medically stable to d/c.   Difficult to place patient No  Jonnie Finner DO [facility]  If 7PM-7AM, please contact night-coverage [email or web address]  12/01/2020, 1:28 PM

## 2020-12-02 ENCOUNTER — Inpatient Hospital Stay (HOSPITAL_COMMUNITY): Payer: Medicare HMO

## 2020-12-02 DIAGNOSIS — R55 Syncope and collapse: Secondary | ICD-10-CM

## 2020-12-02 DIAGNOSIS — T383X1A Poisoning by insulin and oral hypoglycemic [antidiabetic] drugs, accidental (unintentional), initial encounter: Secondary | ICD-10-CM

## 2020-12-02 DIAGNOSIS — E160 Drug-induced hypoglycemia without coma: Secondary | ICD-10-CM

## 2020-12-02 DIAGNOSIS — D638 Anemia in other chronic diseases classified elsewhere: Secondary | ICD-10-CM

## 2020-12-02 DIAGNOSIS — L8990 Pressure ulcer of unspecified site, unspecified stage: Secondary | ICD-10-CM | POA: Insufficient documentation

## 2020-12-02 DIAGNOSIS — I5022 Chronic systolic (congestive) heart failure: Secondary | ICD-10-CM

## 2020-12-02 DIAGNOSIS — C189 Malignant neoplasm of colon, unspecified: Secondary | ICD-10-CM

## 2020-12-02 DIAGNOSIS — L89302 Pressure ulcer of unspecified buttock, stage 2: Secondary | ICD-10-CM

## 2020-12-02 DIAGNOSIS — I4819 Other persistent atrial fibrillation: Secondary | ICD-10-CM

## 2020-12-02 DIAGNOSIS — L89620 Pressure ulcer of left heel, unstageable: Secondary | ICD-10-CM

## 2020-12-02 DIAGNOSIS — E118 Type 2 diabetes mellitus with unspecified complications: Secondary | ICD-10-CM

## 2020-12-02 LAB — COMPREHENSIVE METABOLIC PANEL
ALT: 17 U/L (ref 0–44)
AST: 26 U/L (ref 15–41)
Albumin: 1.7 g/dL — ABNORMAL LOW (ref 3.5–5.0)
Alkaline Phosphatase: 181 U/L — ABNORMAL HIGH (ref 38–126)
Anion gap: 7 (ref 5–15)
BUN: 15 mg/dL (ref 8–23)
CO2: 20 mmol/L — ABNORMAL LOW (ref 22–32)
Calcium: 7.6 mg/dL — ABNORMAL LOW (ref 8.9–10.3)
Chloride: 109 mmol/L (ref 98–111)
Creatinine, Ser: 0.93 mg/dL (ref 0.61–1.24)
GFR, Estimated: >60 mL/min (ref >60)
Glucose, Bld: 126 mg/dL — ABNORMAL HIGH (ref 70–99)
Potassium: 3.9 mmol/L (ref 3.5–5.1)
Sodium: 136 mmol/L (ref 135–145)
Total Bilirubin: 0.4 mg/dL (ref 0.3–1.2)
Total Protein: 5 g/dL — ABNORMAL LOW (ref 6.5–8.1)

## 2020-12-02 LAB — GLUCOSE, CAPILLARY
Glucose-Capillary: 106 mg/dL — ABNORMAL HIGH (ref 70–99)
Glucose-Capillary: 110 mg/dL — ABNORMAL HIGH (ref 70–99)
Glucose-Capillary: 139 mg/dL — ABNORMAL HIGH (ref 70–99)
Glucose-Capillary: 172 mg/dL — ABNORMAL HIGH (ref 70–99)
Glucose-Capillary: 192 mg/dL — ABNORMAL HIGH (ref 70–99)
Glucose-Capillary: 59 mg/dL — ABNORMAL LOW (ref 70–99)
Glucose-Capillary: 79 mg/dL (ref 70–99)

## 2020-12-02 LAB — CBC
HCT: 29.9 % — ABNORMAL LOW (ref 39.0–52.0)
Hemoglobin: 9.2 g/dL — ABNORMAL LOW (ref 13.0–17.0)
MCH: 28.6 pg (ref 26.0–34.0)
MCHC: 30.8 g/dL (ref 30.0–36.0)
MCV: 92.9 fL (ref 80.0–100.0)
Platelets: 232 10*3/uL (ref 150–400)
RBC: 3.22 MIL/uL — ABNORMAL LOW (ref 4.22–5.81)
RDW: 19.5 % — ABNORMAL HIGH (ref 11.5–15.5)
WBC: 7.3 10*3/uL (ref 4.0–10.5)
nRBC: 0 % (ref 0.0–0.2)

## 2020-12-02 LAB — ECHOCARDIOGRAM COMPLETE
Area-P 1/2: 3.23 cm2
S' Lateral: 3.7 cm
Weight: 3594.38 oz

## 2020-12-02 NOTE — Progress Notes (Signed)
PROGRESS NOTE  Ralph Rainwater Sr. TML:[id number] DOB: Mar 22, 1941   PCP: Raina Mina., MD  Patient is from: Home.  DOA: 12/01/2020 LOS: 1  Chief complaints: Hypoglycemia and unresponsiveness  Brief Narrative / Interim history: 80 year old male with PMH of stage IV colon cancer, A. fib not on AC, anemia, NIDDM-2 and HTN brought to ED by EMS after found unresponsive and found to be hypoglycemic to 20 per EMS.  Patient had poor p.o. intake since his last chemotherapy 3 weeks prior.  He is also on glipizide for diabetes.  In ED, hemodynamically stable.  CT head without acute finding.  CMP and CBC without significant finding.  He had another hypoglycemic event with CBG down to 22 despite dextrose.  Admitted on IV dextrose infusion.   Subjective: Seen and examined earlier this morning.  No major events overnight or this morning.  CBG ranges from 59-139 overnight.  Still on D10 at 75 cc an hour.  Denies chest pain, dyspnea, GI or UTI symptoms.  Objective: Vitals:   12/01/20 1644 12/01/20 1648 12/01/20 2150 12/02/20 0429  BP: 136/65 100/65 101/61 114/66  Pulse: 82 86 78 79  Resp:   16 20  Temp:   98.3 F (36.8 C) 98.1 F (36.7 C)  TempSrc:   Oral Oral  SpO2: 96%  97% 97%  Weight:        Intake/Output Summary (Last 24 hours) at 12/02/2020 1207 Last data filed at 12/02/2020 1001 Gross per 24 hour  Intake 1695.69 ml  Output 1550 ml  Net 145.69 ml   Filed Weights   12/01/20 1633  Weight: 101.9 kg    Examination:  GENERAL: No apparent distress.  Nontoxic. HEENT: MMM.  Vision and hearing grossly intact.  NECK: Supple.  No apparent JVD.  RESP:  No IWOB.  Fair aeration bilaterally. CVS: Regular rhythm.  Normal rate. Heart sounds normal.  ABD/GI/GU: BS+. Abd soft, NTND.  MSK/EXT:  Moves extremities. No apparent deformity. No edema.  SKIN: no apparent skin lesion or wound NEURO: Awake, alert and oriented appropriately.  No apparent focal neuro deficit. PSYCH: Calm. Normal  affect.   Procedures:  None  Microbiology summarized: COVID-19 PCR nonreactive.  Assessment & Plan: Refractory hypoglycemia/controlled NIDDM-2: A1c 6.8%.  Likely due to glipizide in the setting of poor p.o. intake.  Cannot fully exclude accidental overdose with glipizide.  Regardless, does not need glipizide Recent Labs  Lab 12/02/20 0006 12/02/20 0059 12/02/20 0414 12/02/20 0810 12/02/20 1139  GLUCAP 59* 79 110* 106* 139*  -Decrease D10 to 50 cc an hour -Liberate diet to regular -Continue CBG monitoring -Continue home statin and gabapentin -Will discontinue glipizide on discharge  Hypoglycemic syncope/chronic systolic CHF: TTE with LVEF of 45 to 50% and global hypokinesis but no other significant finding.  He has no prior echo to compare to.  No cardiopulmonary symptoms.  Appears euvolemic on exam. -PT/OT eval  Persistent A. Fib: Rate controlled.  Not on AC -Continue home Coreg and aspirin  Stage IV colon cancer-Per family, chemo stopped due to poor p.o. intake -Oncology added to treatment team  Anemia of chronic disease: Hgb at baseline. Recent Labs    09/07/20 0000 09/19/20 0000 10/03/20 0000 10/19/20 0000 10/29/20 0000 11/01/20 0000 11/14/20 0000 11/28/20 0000 12/01/20 1201 12/02/20 0529  HGB 9.1* 8.9* 9.1* 9.6* 10.2* 10.3* 11.4* 12.7* 10.1* 9.2*  -Check anemia panel  Anxiety: Stable -Continue home Lexapro  Restless leg syndrome -Continue home Requip    Body mass index is 31.78 kg/m.  Sacral and left heel pressure injury: POA Pressure Injury 12/01/20 Sacrum Mid Stage 2 -  Partial thickness loss of dermis presenting as a shallow open injury with a red, pink wound bed without slough. (Active)  12/01/20 1645  Location: Sacrum  Location Orientation: Mid  Staging: Stage 2 -  Partial thickness loss of dermis presenting as a shallow open injury with a red, pink wound bed without slough.  Wound Description (Comments):   Present on Admission: Yes      Pressure Injury 12/01/20 Heel Left Deep Tissue Pressure Injury - Purple or maroon localized area of discolored intact skin or blood-filled blister due to damage of underlying soft tissue from pressure and/or shear. 2x3 cm dark purple with open area media (Active)  12/01/20 1645  Location: Heel  Location Orientation: Left  Staging: Deep Tissue Pressure Injury - Purple or maroon localized area of discolored intact skin or blood-filled blister due to damage of underlying soft tissue from pressure and/or shear.  Wound Description (Comments): 2x3 cm dark purple with open area medial  Present on Admission: Yes   DVT prophylaxis:  SCDs Start: 12/01/20 1641  Code Status: DNR/DNI-confirmed with patient and patient's daughter Family Communication: Updated patient's daughter over the phone Level of care: Progressive Status is: Inpatient  Remains inpatient appropriate because:Ongoing diagnostic testing needed not appropriate for outpatient work up, IV treatments appropriate due to intensity of illness or inability to take PO and Inpatient level of care appropriate due to severity of illness   Dispo: The patient is from: Home              Anticipated d/c is to: Home              Anticipated d/c date is: 1 day              Patient currently is not medically stable to d/c.   Difficult to place patient No       Consultants:  Oncology   Sch Meds:  Scheduled Meds: . allopurinol  100 mg Oral Daily  . aspirin EC  81 mg Oral Daily  . atorvastatin  20 mg Oral Daily  . escitalopram  10 mg Oral Daily  . feeding supplement  237 mL Oral BID BM  . gabapentin  100 mg Oral BID  . mirtazapine  15 mg Oral QHS  . rOPINIRole  3 mg Oral QHS  . sodium chloride flush  3 mL Intravenous Q12H   Continuous Infusions: . dextrose 75 mL/hr at 12/02/20 0421   PRN Meds:.acetaminophen, HYDROcodone-acetaminophen, magic mouthwash w/lidocaine, prochlorperazine  Antimicrobials: Anti-infectives (From  admission, onward)   None       I have personally reviewed the following labs and images: CBC: Recent Labs  Lab 11/28/20 0000 12/01/20 1201 12/02/20 0529  WBC 8.4 7.7 7.3  NEUTROABS 6.64  --   --   HGB 12.7* 10.1* 9.2*  HCT 40* 32.8* 29.9*  MCV  --  93.4 92.9  PLT 397 231 232   BMP &GFR Recent Labs  Lab 11/28/20 0000 12/01/20 1201 12/01/20 1706 12/02/20 0529  NA 137 137  --  136  K 4.9 3.9  --  3.9  CL 110* 110  --  109  CO2 20 20*  --  20*  GLUCOSE  --  103* 174* 126*  BUN 18 18  --  15  CREATININE 1.1 1.05  --  0.93  CALCIUM 8.8 7.6*  --  7.6*   Estimated Creatinine Clearance: 77.7 mL/min (  by C-G formula based on SCr of 0.93 mg/dL). Liver & Pancreas: Recent Labs  Lab 11/28/20 0000 12/01/20 1201 12/02/20 0529  AST 40 25 26  ALT 24 16 17   ALKPHOS 340* 199* 181*  BILITOT  --  0.7 0.4  PROT  --  5.5* 5.0*  ALBUMIN 3.1* 1.9* 1.7*   No results for input(s): LIPASE, AMYLASE in the last 168 hours. No results for input(s): AMMONIA in the last 168 hours. Diabetic: Recent Labs    12/01/20 1201  HGBA1C 6.8*   Recent Labs  Lab 12/02/20 0006 12/02/20 0059 12/02/20 0414 12/02/20 0810 12/02/20 1139  GLUCAP 59* 79 110* 106* 139*   Cardiac Enzymes: No results for input(s): CKTOTAL, CKMB, CKMBINDEX, TROPONINI in the last 168 hours. No results for input(s): PROBNP in the last 8760 hours. Coagulation Profile: No results for input(s): INR, PROTIME in the last 168 hours. Thyroid Function Tests: No results for input(s): TSH, T4TOTAL, FREET4, T3FREE, THYROIDAB in the last 72 hours. Lipid Profile: No results for input(s): CHOL, HDL, LDLCALC, TRIG, CHOLHDL, LDLDIRECT in the last 72 hours. Anemia Panel: No results for input(s): VITAMINB12, FOLATE, FERRITIN, TIBC, IRON, RETICCTPCT in the last 72 hours. Urine analysis:    Component Value Date/Time   COLORURINE YELLOW 12/01/2020 [place] 12/01/2020 1201   LABSPEC 1.012 12/01/2020 1201   PHURINE  5.0 12/01/2020 [place] 12/01/2020 1201   HGBUR NEGATIVE 12/01/2020 [place] 12/01/2020 1201   KETONESUR NEGATIVE 12/01/2020 1201   PROTEINUR NEGATIVE 12/01/2020 1201   NITRITE NEGATIVE 12/01/2020 1201   [place] 12/01/2020 1201   Sepsis Labs: Invalid input(s): PROCALCITONIN, [place]  Microbiology: Recent Results (from the past 240 hour(s))  SARS Coronavirus 2 by RT PCR (hospital order, performed in [facility] hospital lab) Nasopharyngeal Nasopharyngeal Swab     Status: None   Collection Time: 12/01/20 12:02 PM   Specimen: Nasopharyngeal Swab  Result Value Ref Range Status   SARS Coronavirus 2 NEGATIVE NEGATIVE Final    Comment: (NOTE) SARS-CoV-2 target nucleic acids are NOT DETECTED.  The SARS-CoV-2 RNA is generally detectable in upper and lower respiratory specimens during the acute phase of infection. The lowest concentration of SARS-CoV-2 viral copies this assay can detect is 250 copies / mL. A negative result does not preclude SARS-CoV-2 infection and should not be used as the sole basis for treatment or other patient management decisions.  A negative result may occur with improper specimen collection / handling, submission of specimen other than nasopharyngeal swab, presence of viral mutation(s) within the areas targeted by this assay, and inadequate number of viral copies (<250 copies / mL). A negative result must be combined with clinical observations, patient history, and epidemiological information.  Fact Sheet for Patients:   StrictlyIdeas.no  Fact Sheet for Healthcare Providers: BankingDealers.co.za  This test is not yet approved or  cleared by the [place] FDA and has been authorized for detection and/or diagnosis of SARS-CoV-2 by FDA under an Emergency Use Authorization (EUA).  This EUA will remain in effect (meaning this test can be used) for the duration  of the COVID-19 declaration under Section 564(b)(1) of the Act, 21 U.S.C. section 360bbb-3(b)(1), unless the authorization is terminated or revoked sooner.  Performed at [facility], [place] [address] [place], [place] [address]     Radiology Studies: CT Head Wo Contrast  Result Date: 12/01/2020 CLINICAL DATA:  80 year old male with history of mental status change. EXAM: CT HEAD  WITHOUT CONTRAST TECHNIQUE: Contiguous axial images were obtained from the base of the skull through the vertex without intravenous contrast. COMPARISON:  Head CT 06/02/2019. FINDINGS: Brain: Mild cerebral atrophy. Patchy areas of decreased attenuation are noted throughout the deep and periventricular white matter of the cerebral hemispheres bilaterally, compatible with mild chronic microvascular ischemic disease. No evidence of acute infarction, hemorrhage, hydrocephalus, extra-axial collection or mass lesion/mass effect. Vascular: No hyperdense vessel or unexpected calcification. Skull: Normal. Negative for fracture or focal lesion. Sinuses/Orbits: No acute finding. Other: None. IMPRESSION: 1. No acute intracranial abnormalities. 2. Mild cerebral atrophy with mild chronic microvascular ischemic changes in the cerebral white matter, as above. Electronically Signed   By: Vinnie Langton M.D.   On: 12/01/2020 13:53   ECHOCARDIOGRAM COMPLETE  Result Date: 12/02/2020    ECHOCARDIOGRAM REPORT   Patient Name:   Ralph Kos Sr. Date of Exam: 12/02/2020 Medical Rec #:  [id number]          Height:       70.5 in Accession #:    [id number]         Weight:       224.6 lb Date of Birth:  12/25/1940          BSA:          2.205 m Patient Age:    80 years           BP:           114/76 mmHg Patient Gender: M                  HR:           79 bpm. Exam Location:  Inpatient Procedure: 2D Echo Indications:     Syncope R55  History:         Patient has no prior history of Echocardiogram examinations.                  Risk  Factors:Hypertension and Diabetes.  Sonographer:     Mikki Santee RDCS (AE) Referring Phys:  [id number] Jonnie Finner Diagnosing Phys: Fransico Him MD IMPRESSIONS  1. Left ventricular ejection fraction, by estimation, is 45 to 50%. The left ventricle has mildly decreased function. The left ventricle demonstrates global hypokinesis. Left ventricular diastolic function could not be evaluated.  2. Right ventricular systolic function is normal. The right ventricular size is normal. There is normal pulmonary artery systolic pressure. The estimated right ventricular systolic pressure is 41.7 mmHg.  3. Left atrial size was mildly dilated.  4. The mitral valve is normal in structure. Trivial mitral valve regurgitation. No evidence of mitral stenosis.  5. The aortic valve was not well visualized. There is moderate calcification of the aortic valve. Aortic valve regurgitation is not visualized. Mild to moderate aortic valve sclerosis/calcification is present, without any evidence of aortic stenosis.  6. Aortic dilatation noted. There is mild dilatation of the aortic root, measuring 38 mm.  7. The inferior vena cava is normal in size with <50% respiratory variability, suggesting right atrial pressure of 8 mmHg. FINDINGS  Left Ventricle: Left ventricular ejection fraction, by estimation, is 45 to 50%. The left ventricle has mildly decreased function. The left ventricle demonstrates global hypokinesis. The left ventricular internal cavity size was normal in size. There is  no left ventricular hypertrophy. Left ventricular diastolic function could not be evaluated due to atrial fibrillation. Left ventricular diastolic function could not be evaluated. Right Ventricle: The right ventricular size is normal.  No increase in right ventricular wall thickness. Right ventricular systolic function is normal. There is normal pulmonary artery systolic pressure. The tricuspid regurgitant velocity is 1.79 m/s, and  with an assumed right  atrial pressure of 8 mmHg, the estimated right ventricular systolic pressure is [id number] mmHg. Left Atrium: Left atrial size was mildly dilated. Right Atrium: Right atrial size was normal in size. Pericardium: There is no evidence of pericardial effusion. Mitral Valve: The mitral valve is normal in structure. Mild mitral annular calcification. Trivial mitral valve regurgitation. No evidence of mitral valve stenosis. Tricuspid Valve: The tricuspid valve is normal in structure. Tricuspid valve regurgitation is trivial. No evidence of tricuspid stenosis. Aortic Valve: The aortic valve was not well visualized. There is moderate calcification of the aortic valve. Aortic valve regurgitation is not visualized. Mild to moderate aortic valve sclerosis/calcification is present, without any evidence of aortic stenosis. Pulmonic Valve: The pulmonic valve was normal in structure. Pulmonic valve regurgitation is not visualized. No evidence of pulmonic stenosis. Aorta: Aortic dilatation noted. There is mild dilatation of the aortic root, measuring 38 mm. Venous: The inferior vena cava is normal in size with less than 50% respiratory variability, suggesting right atrial pressure of 8 mmHg. IAS/Shunts: No atrial level shunt detected by color flow Doppler.  LEFT VENTRICLE PLAX 2D LVIDd:         5.20 cm  Diastology LVIDs:         3.70 cm  LV e' medial:    6.48 cm/s LV PW:         1.10 cm  LV E/e' medial:  13.9 LV IVS:        0.90 cm  LV e' lateral:   8.59 cm/s LVOT diam:     2.50 cm  LV E/e' lateral: 10.5 LV SV:         52 LV SV Index:   23 LVOT Area:     4.91 cm  LEFT ATRIUM             Index       RIGHT ATRIUM           Index LA diam:        4.40 cm 2.00 cm/m  RA Area:     16.70 cm LA Vol (A2C):   91.4 ml 41.46 ml/m RA Volume:   36.60 ml  16.60 ml/m LA Vol (A4C):   76.8 ml 34.84 ml/m LA Biplane Vol: 89.0 ml 40.37 ml/m  AORTIC VALVE LVOT Vmax:   59.10 cm/s LVOT Vmean:  43.100 cm/s LVOT VTI:    0.105 m  AORTA Ao Root diam: 3.80 cm  MITRAL VALVE               TRICUSPID VALVE MV Area (PHT): 3.23 cm    TR Peak grad:   12.8 mmHg MV Decel Time: 235 msec    TR Vmax:        179.00 cm/s MV E velocity: 90.00 cm/s MV A velocity: 33.70 cm/s  SHUNTS MV E/A ratio:  2.67        Systemic VTI:  0.10 m                            Systemic Diam: 2.50 cm Fransico Him MD Electronically signed by Fransico Him MD Signature Date/Time: 12/02/2020/11:33:31 AM    Final (Updated)       Tom Ragsdale T. [place]  If 7PM-7AM, please contact night-coverage [email or web address]  12/02/2020, 12:07 PM

## 2020-12-02 NOTE — Progress Notes (Addendum)
Patient here for hypoglycemic episode. Pt is CBG Q4. CBG currently 59, protocol started. Patient given oral fluids and CBG will be rechecked in 15 mins.   Update: Patient CBG rechecked, currently 79. Will give additional fluids to keep consistent and recheck as ordered.

## 2020-12-02 NOTE — Progress Notes (Signed)
  Echocardiogram 2D Echocardiogram has been performed.  Jennette Dubin 12/02/2020, 9:25 AM

## 2020-12-02 NOTE — Consult Note (Signed)
WOC Nurse Consult Note: Reason for Consult: Deep Tissue Pressure Injury (DTPI) to left heel, Sacral Stage 2 PI. Heather Bullins, RN, WTA identified the lesions. Wound type:Pressure Pressure Injury POA: Yes Measurement: Left heel measures 2cm x 3cm and is a purple/maroon discoloration. This is expected to be a full thickness wound unless reversed by off-loading and meticulous topical care. Sacral Stage 2 PI: Pink, moist wound bed, nursing to obtain measurements later this evening and document on nursing flow sheet. Wound bed:As described above Drainage (amount, consistency, odor) Scant from Stage 2 sacral wound. None from left heel Periwound:intact, dry Dressing procedure/placement/frequency: I have provided Nursing with guidance via the orders for the care of these two areas. Prevalon Boots are provided and may be used at home while patient is in bed and not while ambulating. Silicone foam dressings will be used, at the sacrum the wound contact layer will be xeroform, an antimicrobial nonadherent.  [facility] nursing team will not follow, but will remain available to this patient, the nursing and medical teams.  Please re-consult if needed. Thanks, Maudie Flakes, MSN, RN, [place], Arther Abbott  Pager# [phone]

## 2020-12-02 NOTE — Plan of Care (Signed)
Patient maintained blood glucose levels greater than 100 on 7 a to 7p shift.   Patient with fair appetite, does drink Ensure.  Up to chair with one assist.  Son and wife in to visit this shift.

## 2020-12-03 ENCOUNTER — Inpatient Hospital Stay: Payer: Medicare HMO

## 2020-12-03 DIAGNOSIS — L89312 Pressure ulcer of right buttock, stage 2: Secondary | ICD-10-CM

## 2020-12-03 DIAGNOSIS — R5381 Other malaise: Secondary | ICD-10-CM

## 2020-12-03 DIAGNOSIS — E162 Hypoglycemia, unspecified: Secondary | ICD-10-CM

## 2020-12-03 DIAGNOSIS — Z66 Do not resuscitate: Secondary | ICD-10-CM

## 2020-12-03 DIAGNOSIS — E11649 Type 2 diabetes mellitus with hypoglycemia without coma: Principal | ICD-10-CM

## 2020-12-03 DIAGNOSIS — E440 Moderate protein-calorie malnutrition: Secondary | ICD-10-CM

## 2020-12-03 LAB — GLUCOSE, CAPILLARY
Glucose-Capillary: 102 mg/dL — ABNORMAL HIGH (ref 70–99)
Glucose-Capillary: 108 mg/dL — ABNORMAL HIGH (ref 70–99)
Glucose-Capillary: 131 mg/dL — ABNORMAL HIGH (ref 70–99)
Glucose-Capillary: 204 mg/dL — ABNORMAL HIGH (ref 70–99)
Glucose-Capillary: 208 mg/dL — ABNORMAL HIGH (ref 70–99)

## 2020-12-03 NOTE — Progress Notes (Signed)
Pt son here to take pt home. Pt taken to main entrance via wheelchair with all belongings.

## 2020-12-03 NOTE — Progress Notes (Signed)
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   Current Treatment:  Supportive care  HISTORY OF PRESENT ILLNESS:  Larry is a 80 y.o. 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  He was referred from the emergency department for the evaluation and treatment of adenocarcinoma of the cecum.  He presented to the [facility] emergency department on September 24th due to abdominal discomfort, melena, hematochezia, anorexia and weight loss.  He had been started on apixaban the week prior by his primary care physician for atrial fibrillation.  However, in 3-4 days the patient started having black stool and bright red blood per rectum.  This medication was held as advised by his primary physician and he was advised to present to the ER.  While in the ER he was started on pantoprazole drip due to concern for GI bleed and GI services was consulted.  CTA imaging revealed findings most consistent with metastatic colorectal carcinoma with innumerable hypoattenuating masses throughout the liver, the largest measuring 4.8 cm, and both regional and distal nodal metastatic disease.  There was also a growing exophytic nodule arising from the right kidney measuring 2.7 cm, previously 2.2 cm, concerning for renal cell carcinoma.  There was no evidence of active arterial GI bleeding.  He underwent EGD and total colonoscopy with biopsy on September 25th, which revealed several large nonobstructing malignant appearing masses in the cecum.  Surgical pathology from this procedure confirmed adenocarcinoma with necrosis.  MMR was normal.  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 Iron studies were notable for iron deficiency with an iron level of 12.0 with a TIBC of 242 for a percent saturation of 4.9%.  The patient was given IV Feraheme and started on oral supplement 325 mg BID. His iron supplement was subsequently increased to TID. He was discharged on September 26th, and lab work at that time was unremarkable except for a hemoglobin of 8.3, and a calcium of 8.2, and an elevated alkaline phosphatase.  He had his first cycle of FOLFOX/bevacizumab on October 13th and tolerated this fairly well.  As he has continued to lose weight, he was placed on mirtazapine 7.5 mg at bedtime to improve his appetite, this was subsequently increased to 15 mg at bedtime.  The CEA has steadily decreased from 57.2 down to 25.2 in November and 12.9 in December.   CT abdomen and pelvis on January 8th revealed a mixed response with an increase in the porta hepatis lymph nodes from previous and new small bilateral pleural effusions right greater than left.  The liver metastasis was fairly stable.  There was a decrease in the cecal mass from previous. The exophytic right kidney nodule measured approximately 2.3 cm, which was stable to improved.  A chest x-ray revealed a new mild opacity in the medial right lung base felt to be atelectasis versus infiltrate.  INTERVAL HISTORY:  He is here for follow up for continued supportive care.  When he was seen last week, he was extremely weak with weight loss, diarrhea, anorexia and fatigue.  He was given IV fluids.  He was taken to the hospital over   the hospital over the weekend and was found to have a blood sugar of 21.  He is now feeling better and eating better and taking the Remeron.  He has [facility] coming out and we will recommend physical therapy as well.  He does complain of a pressure sore of his left heal, and we will see if they can obtain heel booties for him.  He has been instructed to stay off the  heel.  He remains in a wheelchair.  He complains of pain and swelling of his right wrist.  His blood work is mainly remarkable for an elevated potassium of 5.6.  He does not seem to have an excessive amount of potassium in his diet, but I gave him a list of potassium rich foods to avoid.  His  appetite is good, and he has gained 3 pounds since his last visit.  He denies fever, chills or other signs of infection.  He denies nausea, vomiting, bowel issues, or abdominal pain.  He denies sore throat, cough, dyspnea, or chest pain.  His son was here with him today.  REVIEW OF SYSTEMS:  Review of Systems  Constitutional: Negative.   HENT:  Negative.   Eyes: Negative.   Respiratory: Negative.   Cardiovascular: Negative.   Gastrointestinal: Negative.   Endocrine: Negative.   Genitourinary: Negative.    Musculoskeletal: Negative.   Skin:       Bedsore of the left heel; swelling of the right wrist.  Neurological: Negative.   Hematological: Negative.   Psychiatric/Behavioral: Negative.   All other systems reviewed and are negative.    VITALS:  Vital signs today include a weight of 227.6 lb, blood pressure 113/56, pulse 107, respirations 18 and temperature 97.4.  Pulse oximetry is 94% on room air. Wt Readings from Last 3 Encounters:  12/03/20 224 lb 10.4 oz (101.9 kg)  11/28/20 221 lb 9.6 oz (100.5 kg)  11/14/20 225 lb 14.4 oz (102.5 kg)    There is no height or weight on file to calculate BMI.  Performance status (ECOG): 1 - Symptomatic but completely ambulatory  PHYSICAL EXAM:  Physical Exam Constitutional:      General: He is not in acute distress.    Appearance: Normal appearance. He is normal weight.  HENT:     Head: Normocephalic and atraumatic.  Eyes:     General: No scleral icterus.    Extraocular Movements: Extraocular movements intact.     Conjunctiva/sclera: Conjunctivae normal.     Pupils: Pupils are equal, round, and reactive to light.  Cardiovascular:     Rate and  Rhythm: Regular rhythm. Tachycardia present.     Pulses: Normal pulses.     Heart sounds: Normal heart sounds. No murmur heard. No friction rub. No gallop.   Pulmonary:     Effort: Pulmonary effort is normal. No respiratory distress.     Breath sounds: Normal breath sounds.  Abdominal:     General: Bowel sounds are normal. There is no distension.     Palpations: Abdomen is soft. There is no mass.     Tenderness: There is no abdominal tenderness.  Musculoskeletal:        General: Normal range of motion.     Cervical back: Normal range of motion and neck supple.     Right lower leg: No edema.     Left lower leg: No edema.  Lymphadenopathy:     Cervical: No cervical adenopathy.  Skin:    General: Skin is warm   and dry.     Comments: Right upper extremity reveals phlebitis along the dorsum of the right wrist which is hard and tender  Neurological:     General: No focal deficit present.     Mental Status: He is alert and oriented to person, place, and time. Mental status is at baseline.  Psychiatric:        Mood and Affect: Mood normal.        Behavior: Behavior normal.        Thought Content: Thought content normal.        Judgment: Judgment normal.    LABS:    CBC Latest Ref Rng & Units 12/02/2020 12/01/2020 11/28/2020  WBC 4.0 - 10.5 K/uL 7.3 7.7 8.4  Hemoglobin 13.0 - 17.0 g/dL 9.2(L) 10.1(L) 12.7(A)  Hematocrit 39.0 - 52.0 % 29.9(L) 32.8(L) 40(A)  Platelets 150 - 400 K/uL 232 231 397   CMP Latest Ref Rng & Units 12/02/2020 12/01/2020 12/01/2020  Glucose 70 - 99 mg/dL 126(H) 174(H) 103(H)  BUN 8 - 23 mg/dL 15 - 18  Creatinine 0.61 - 1.24 mg/dL 0.93 - 1.05  Sodium 135 - 145 mmol/L 136 - 137  Potassium 3.5 - 5.1 mmol/L 3.9 - 3.9  Chloride 98 - 111 mmol/L 109 - 110  CO2 22 - 32 mmol/L 20(L) - 20(L)  Calcium 8.9 - 10.3 mg/dL 7.6(L) - 7.6(L)  Total Protein 6.5 - 8.1 g/dL 5.0(L) - 5.5(L)  Total Bilirubin 0.3 - 1.2 mg/dL 0.4 - 0.7  Alkaline Phos 38 - 126 U/L 181(H) - 199(H)  AST  15 - 41 U/L 26 - 25  ALT 0 - 44 U/L 17 - 16     Lab Results  Component Value Date   CEA1 12.9 (H) 11/01/2020   /  CEA  Date Value Ref Range Status  11/01/2020 12.9 (H) 0.0 - 4.7 ng/mL Final    Comment:    (NOTE)                             Nonsmokers          <3.9                             Smokers             <5.6 [employer] Electrochemiluminescence Immunoassay (ECLIA) Values obtained with different assay methods or kits cannot be used interchangeably.  Results cannot be interpreted as absolute evidence of the presence or absence of malignant disease. Performed At: [facility] [address] [place], [place] [id number] Nagendra Sanjai MD Ph:[phone]       STUDIES:   No current studies  Allergies:  Allergies  Allergen Reactions  . Chlorhexidine     Port a cath not accessed     Current Medications: No current facility-administered medications for this visit.   No current outpatient medications on file.   Facility-Administered Medications Ordered in Other Visits  Medication Dose Route Frequency Provider Last Rate Last Admin  . acetaminophen (TYLENOL) tablet 650 mg  650 mg Oral Q6H PRN Kyle, Tyrone A, DO   650 mg at 12/01/20 1746  . allopurinol (ZYLOPRIM) tablet 100 mg  100 mg Oral Daily Kyle, Tyrone A, DO   100 mg at 12/03/20 0927  . aspirin EC tablet 81 mg  81 mg Oral Daily Kyle, Tyrone A, DO   81 mg at 12/03/20 0927  .   atorvastatin (LIPITOR) tablet 20 mg  20 mg Oral Daily Kyle, Tyrone A, DO   20 mg at 12/03/20 6644  . escitalopram (LEXAPRO) tablet 10 mg  10 mg Oral Daily Kyle, Tyrone A, DO   10 mg at 12/03/20 0347  . feeding supplement (ENSURE ENLIVE / ENSURE PLUS) liquid 237 mL  237 mL Oral BID BM Kyle, Tyrone A, DO   237 mL at 12/02/20 1810  . gabapentin (NEURONTIN) capsule 100 mg  100 mg Oral BID Marylyn Ishihara, Tyrone A, DO   100 mg at 12/03/20 4259  . HYDROcodone-acetaminophen (NORCO/VICODIN) 5-325 MG per tablet 1 tablet  1 tablet Oral TID PRN Marylyn Ishihara,  Tyrone A, DO      . magic mouthwash w/lidocaine  5 mL Oral QID PRN Marylyn Ishihara, Tyrone A, DO      . mirtazapine (REMERON) tablet 15 mg  15 mg Oral QHS Kyle, Tyrone A, DO   15 mg at 12/02/20 2204  . prochlorperazine (COMPAZINE) tablet 10 mg  10 mg Oral Q6H PRN Marylyn Ishihara, Tyrone A, DO      . rOPINIRole (REQUIP) tablet 3 mg  3 mg Oral QHS Kyle, Tyrone A, DO   3 mg at 12/02/20 2204  . sodium chloride flush (NS) 0.9 % injection 3 mL  3 mL Intravenous Q12H Kyle, Tyrone A, DO   3 mL at 12/01/20 2148     ASSESSMENT & PLAN:   Assessment:   1. 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  We do not advise surgical resection or radiation.  MMR were normal. KRAS and PIK3CA were positive.  He has regional and distal nodal metastatic disease.   He tolerated his last cycle of palliative FOLFOX/bevacizumab poorly due to fatigue, general weakness and decreased appetite.  Recent CT imaging in the emergency room revealed mixed response with a decrease in his cecal mass and stable liver metastasis, but an increase of the porta hepatis lymphadenopathy, as well as new small bilateral pleural effusions.  2. Innumerable hypoattenuating masses throughout the liver consistent with metastatic disease, which appears fairly stable on most recent CT imaging.  3. Iron deficiency anemia for which he is on oral iron supplement three times daily.  I told them they could decrease the iron to daily for now.  4. Exophytic nodule arising from the right kidney measuring 2.7 cm.  This has mildly enlarged since prior study, but has been present for the past 3 years.  This measured 2.3 cm on recent CT.  5. Complicated pain from issues with his spine, for which he sees pain management.  He is currently on hydrocodone/APAP as needed.  6.  Numbness and paresthesias of his fingers, from prior spinal surgery.  This remains stable.   7. Possible early pneumonia on chest x-ray from January 8th,  treated with azithromycin.  He is without symptoms of infection.  8.  Severe debilitation, which is slowly improving with the help of home health and home physical therapy.  We stressed the importance of using nutritional supplements and/or eating several small meals a day in order to avoid further general decline.  Plan:     I did not feel he could tolerate any further aggressive chemotherapy based on his performance status last week. However, he has improved considerably and is interested in further treatment if that is possible.  Unfortunately the next treatment of choice would be FOLFIRI and we would have to give him reduced dosing in view of his performance status  and age.  I had a discussion with him and his son about how his CEA has come down nicely and how he has had a partial response to treatment.  We will plan to see him back in 2 weeks with CBC, CMP and CEA for continued supportive care. The patient and his family understand the plans discussed today and are in agreement with them.  They know to contact our office if he develops issues during immediate clinical assessment.   We provided 30 minutes of face-to-face time during this this encounter and > 50% was spent counseling as documented under my assessment and plan.    Christine H McCarty, MD [facility] [address] [place] [address] Dept: [phone] Dept Fax: [phone]    

## 2020-12-03 NOTE — Evaluation (Signed)
Occupational Therapy Evaluation and Discharge Patient Details Name: Ralph Fergusson Sr. MRN: [id number] DOB: Jul 04, 1941 Today's Date: 12/03/2020    History of Present Illness Ralph Reckner Sr. is a 80 y.o. male with medical history significant of S4 adenocarcinoma of the colon, a fib, DM2, anemia. Presenting with hypoglycemia   Clinical Impression   This 80 yo male admitted with above presents to acute OT at an overall S level and will have his at home from wife and dtr. No further OT needs,we will sign off.    Follow Up Recommendations  No OT follow up;Supervision/Assistance - 24 hour    Equipment Recommendations  None recommended by OT       Precautions / Restrictions Precautions Precautions: Fall Restrictions Weight Bearing Restrictions: No      Mobility Bed Mobility Overal bed mobility: Needs Assistance Bed Mobility: Supine to Sit     Supine to sit: Min assist;HOB elevated     General bed mobility comments: A for trunk--last "little bit"    Transfers Overall transfer level: Needs assistance Equipment used: Rolling walker (2 wheeled) Transfers: Sit to/from Stand Sit to Stand: Supervision         General transfer comment: S to ambulate around the room with RW    Balance Overall balance assessment: Mild deficits observed, not formally tested                                         ADL either performed or assessed with clinical judgement   ADL                                         General ADL Comments: Overall S level     Vision Patient Visual Report: No change from baseline              Pertinent Vitals/Pain Pain Assessment: No/denies pain     Hand Dominance Left   Extremity/Trunk Assessment Upper Extremity Assessment Upper Extremity Assessment: Overall WFL for tasks assessed           Communication Communication Communication: HOH   Cognition Arousal/Alertness: Awake/alert Behavior During  Therapy: WFL for tasks assessed/performed Overall Cognitive Status: Within Functional Limits for tasks assessed                                 General Comments: Could be more safe with RW              Home Living Family/patient expects to be discharged to:: Private residence Living Arrangements: Children;Spouse/significant other (dtr) Available Help at Discharge: Family;Available 24 hours/day Type of Home: Apartment Home Access: Ramped entrance;Level entry     Home Layout: One level     Bathroom Shower/Tub: Tub/shower unit;Curtain   Bathroom Toilet: Handicapped height     Home Equipment: Environmental consultant - 2 wheels;Bedside commode;Tub bench;Grab bars - tub/shower;Hand held shower head   Additional Comments: reports he does fall (has bruise on left knee)      Prior Functioning/Environment Level of Independence: Independent                          OT Goals(Current goals can be found in the care plan section) Acute Rehab OT Goals  Patient Stated Goal: to go home today  OT Frequency:                AM-PAC OT "6 Clicks" Daily Activity     Outcome Measure Help from another person eating meals?: None Help from another person taking care of personal grooming?: A Little (S) Help from another person toileting, which includes using toliet, bedpan, or urinal?: A Little (S) Help from another person bathing (including washing, rinsing, drying)?: A Little (S) Help from another person to put on and taking off regular upper body clothing?: A Little (S) Help from another person to put on and taking off regular lower body clothing?: A Little (S) 6 Click Score: 19   End of Session Equipment Utilized During Treatment: Gait belt;Rolling walker  Activity Tolerance: Patient tolerated treatment well Patient left: in chair;with call bell/phone within reach;with chair alarm set  OT Visit Diagnosis: Unsteadiness on feet (R26.81)                Time: 0347-4259 OT Time  Calculation (min): 20 min Charges:  OT General Charges $OT Visit: 1 Visit OT Evaluation $OT Eval Moderate Complexity: 1 Mod  [address], OTR/L Acute [employer] Pager [phone] Office [phone]     Almon Register 12/03/2020, 9:01 AM

## 2020-12-03 NOTE — Discharge Summary (Signed)
Physician Discharge Summary  Ralph Griffin. WLS:[id number] DOB: [birth year] DOA: 12/01/2020  PCP: Ralph Griffin., MD  Admit date: 12/01/2020 Discharge date: 12/03/2020  Admitted From: Home Disposition: Home  Recommendations for Outpatient Follow-up:  1. Follow up with PCP in 1 to 2 weeks 2. Ambulatory referral to cardiology and wound center ordered 3. Please obtain CBC/BMP/Mag at follow up 4. Please follow up on the following pending results: None  Home Health: PT/OT/RN/aide Equipment/Devices: None  Discharge Condition: Stable CODE STATUS: DNR/DNI   Follow-up Information    Ralph Griffin., MD. Schedule an appointment as soon as possible for a visit in 1 week(s).   Specialty: Internal Medicine Contact information: [place] [address] [place] [address].   Specialty: Home Health Services Why: [facility] nursing,physical therapy/occupational therapy,aide Contact information: [address] [place] [address] [phone]               Hospital Course: 80 year old male with PMH of stage IV colon cancer, A. fib not on AC, anemia, NIDDM-2 and HTN brought to ED by EMS after found unresponsive and found to be hypoglycemic to 20 per EMS.  Patient had poor p.o. intake since his last chemotherapy 3 weeks prior.  He is also on glipizide for diabetes.  In ED, hemodynamically stable.  CT head without acute finding.  CMP and CBC without significant finding.  He had another hypoglycemic event with CBG down to 22 despite dextrose.  Admitted on IV dextrose infusion.   Eventually, hypoglycemia resolved and he felt well to go home.  Glipizide discontinued on discharge.   Of note, TTE showed LVEF of 45 to 50% and global hypokinesis which is likely chronic.  Patient is compensated and not on diuretics.  Ambulatory referral to cardiology ordered.  He also has ambulatory referral to wound care for his stage II wound on his  buttock and unstageable wound on his left heel.  See individual problem list below for more hospital course. Discharge Diagnoses:  Refractory hypoglycemia/controlled NIDDM-2: A1c 6.8%. CBG 20 at home and in ED.  Likely due to glipizide in the setting of poor p.o. intake.  Hypoglycemia resolved. Recent Labs  Lab 12/03/20 0043 12/03/20 0605 12/03/20 0748 12/03/20 1210 12/03/20 1508  GLUCAP 131* 108* 102* 204* 208*  -Discharged on home Metformin and Ozempic. -Discontinued glipizide -Continue home statin and gabapentin  Syncopal episode: Likely due to hypoglycemia.  Chronic systolic CHF: TTE with LVEF of 45 to 50% and global hypokinesis but no other significant finding.  He has no prior echo to compare to.  He is compensated.  Not on diuretics. -Ambulatory referral to cardiology -Continue home Coreg  Persistent A. Fib: Rate controlled.  Not on AC -Continue home Coreg and aspirin  Stage IV colon cancer-Per family, chemo stopped due to poor p.o. intake -Oncology added to treatment team  Anemia of chronic disease: Hgb at baseline. Recent Labs    09/07/20 0000 09/19/20 0000 10/03/20 0000 10/19/20 0000 10/29/20 0000 11/01/20 0000 11/14/20 0000 11/28/20 0000 12/01/20 1201 12/02/20 0529  HGB 9.1* 8.9* 9.1* 9.6* 10.2* 10.3* 11.4* 12.7* 10.1* 9.2*  -Check CBC at follow-up  Anxiety: Stable -Continue home Lexapro  Restless leg syndrome -Continue home Requip  Debility/physical deconditioning -Home health PT/OT/RN/aide  Body mass index is 32.23 kg/m. Nutrition Problem: Moderate Malnutrition Etiology: chronic illness,cancer and cancer related treatments Signs/Symptoms: mild fat depletion,mild muscle depletion,moderate fat depletion,moderate muscle depletion Interventions: Ensure  Enlive (each supplement provides 350kcal and 20 grams of protein),Refer to RD note for recommendations  Stage II sacral pressure injury and unstageable left heel pressure injury:  POA -Ambulatory referral to wound center ordered. -Home health RN/aide as well Pressure Injury 12/01/20 Sacrum Mid Stage 2 -  Partial thickness loss of dermis presenting as a shallow open injury with a red, pink wound bed without slough. (Active)  12/01/20 1645  Location: Sacrum  Location Orientation: Mid  Staging: Stage 2 -  Partial thickness loss of dermis presenting as a shallow open injury with a red, pink wound bed without slough.  Wound Description (Comments):   Present on Admission: Yes     Pressure Injury 12/01/20 Heel Left Deep Tissue Pressure Injury - Purple or maroon localized area of discolored intact skin or blood-filled blister due to damage of underlying soft tissue from pressure and/or shear. 2x3 cm dark purple with open area media (Active)  12/01/20 1645  Location: Heel  Location Orientation: Left  Staging: Deep Tissue Pressure Injury - Purple or maroon localized area of discolored intact skin or blood-filled blister due to damage of underlying soft tissue from pressure and/or shear.  Wound Description (Comments): 2x3 cm dark purple with open area medial  Present on Admission: Yes    Discharge Exam: Vitals:   12/03/20 0533 12/03/20 1247  BP: 117/68 113/74  Pulse: 88 86  Resp: 16   Temp: 98.9 F (37.2 C) 97.8 F (36.6 C)  SpO2: 94% 97%    GENERAL: No apparent distress.  Nontoxic. HEENT: MMM.  Vision and hearing grossly intact.  NECK: Supple.  No apparent JVD.  RESP: On RA no IWOB.  Fair aeration bilaterally. CVS: Irregular rhythm.  Normal rate.Marland Kitchen Heart sounds normal.  ABD/GI/GU: Bowel sounds present. Soft. Non tender.  MSK/EXT:  Moves extremities. No apparent deformity. No edema.  SKIN: Stage II sacral wound on the right and unstageable left heel wound NEURO: Awake, alert and oriented appropriately.  No apparent focal neuro deficit. PSYCH: Calm. Normal affect.  Discharge Instructions  Discharge Instructions    (HEART FAILURE PATIENTS) Call MD:  Anytime you  have any of the following symptoms: 1) 3 pound weight gain in 24 hours or 5 pounds in 1 week 2) shortness of breath, with or without a dry hacking cough 3) swelling in the hands, feet or stomach 4) if you have to sleep on extra pillows at night in order to breathe.   Complete by: As directed    AMB referral to wound care center   Complete by: As directed    Ambulatory referral to Cardiology   Complete by: As directed    Call MD for:  difficulty breathing, headache or visual disturbances   Complete by: As directed    Call MD for:  extreme fatigue   Complete by: As directed    Call MD for:  persistant dizziness or light-headedness   Complete by: As directed    Diet - low sodium heart healthy   Complete by: As directed    Diet Carb Modified   Complete by: As directed    Discharge instructions   Complete by: As directed    It has been a pleasure taking care of you!  You were hospitalized because you passed out likely due to low blood sugar which might have been caused by glipizide in the setting of poor appetite.  We stopped her glipizide going forward.  Your echocardiogram also showed heart failure, which might have been there for  sometimes.  It is unlikely that your heart failure has something to do with your passing out.  However, will recommend follow-up with cardiology going forward.  We have sent referral to cardiology in [place].  Someone will get in touch with you about this referral in the next 1 to 2 weeks.   Take care,   Discharge wound care:   Complete by: As directed    Recommend monitoring and offloading of the left heel and backside   Increase activity slowly   Complete by: As directed      Allergies as of 12/03/2020      Reactions   Chlorhexidine    Port a cath not accessed       Medication List    STOP taking these medications   B-D UF III MINI PEN NEEDLES 31G X 5 MM Misc Generic drug: Insulin Pen Needle   glipiZIDE 10 MG tablet Commonly known as: GLUCOTROL      TAKE these medications   allopurinol 100 MG tablet Commonly known as: ZYLOPRIM Take 100 mg by mouth daily.   aspirin EC 81 MG tablet Take 81 mg by mouth daily.   atorvastatin 20 MG tablet Commonly known as: LIPITOR Take 20 mg by mouth daily.   carvedilol 12.5 MG tablet Commonly known as: COREG Take 12.5 mg by mouth 2 (two) times daily with a meal.   CENTRUM SILVER 50+MEN PO Take 1 tablet by mouth daily.   cholecalciferol 25 MCG (1000 UNIT) tablet Commonly known as: VITAMIN D3 Take 2,000 Units by mouth daily.   escitalopram 10 MG tablet Commonly known as: LEXAPRO Take 10 mg by mouth daily.   ferrous sulfate 325 (65 FE) MG tablet Take 325 mg by mouth daily with breakfast.   gabapentin 100 MG capsule Commonly known as: NEURONTIN Take 100 mg by mouth 2 (two) times daily.   GlucoCom Blood Glucose Monitor Devi Check blood sugars once a day   Location manager System w/Device Kit CHECK BLOOD SUGARS ONCE A DAY   HYDROcodone-acetaminophen 5-325 MG tablet Commonly known as: NORCO/VICODIN Take 1 tablet by mouth 3 (three) times daily as needed for moderate pain.   magic mouthwash w/lidocaine Soln Take 5 mLs by mouth 4 (four) times daily as needed for mouth pain.   metFORMIN 500 MG tablet Commonly known as: GLUCOPHAGE Take 1,000 mg by mouth 2 (two) times daily.   mirtazapine 15 MG tablet Commonly known as: Remeron Take 1 tablet (15 mg total) by mouth at bedtime.   omeprazole 40 MG capsule Commonly known as: PRILOSEC Take 40 mg by mouth daily.   ondansetron 4 MG tablet Commonly known as: ZOFRAN Take 1 tablet (4 mg total) by mouth every 4 (four) hours as needed for nausea.   OneTouch Delica Lancets 16X Misc CHECK BLOOD SUGAR EVERY DAY AS DIRECTED.   OneTouch Verio test strip Generic drug: glucose blood Check blood sugars once a day   OneTouch Verio test strip Generic drug: glucose blood daily.   Ozempic (0.25 or 0.5 MG/DOSE) 2 MG/1.5ML Sopn Generic  drug: Semaglutide(0.25 or 0.5MG/DOS) Inject 1 mg into the skin once a week. Friday   prochlorperazine 10 MG tablet Commonly known as: COMPAZINE Take 1 tablet (10 mg total) by mouth every 6 (six) hours as needed for nausea or vomiting. What changed: Another medication with the same name was removed. Continue taking this medication, and follow the directions you see here.   rOPINIRole 3 MG tablet Commonly known as: REQUIP Take 3 mg  by mouth at bedtime.   vitamin B-12 1000 MCG tablet Commonly known as: CYANOCOBALAMIN Take 1,000 mcg by mouth daily.            Discharge Care Instructions  (From admission, onward)         Start     Ordered   12/03/20 0000  Discharge wound care:       Comments: Recommend monitoring and offloading of the left heel and backside   12/03/20 1032          Consultations:  None  Procedures/Studies:  2D Echo on 12/02/2020 1. Left ventricular ejection fraction, by estimation, is 45 to 50%. The  left ventricle has mildly decreased function. The left ventricle  demonstrates global hypokinesis. Left ventricular diastolic function could  not be evaluated.  2. Right ventricular systolic function is normal. The right ventricular  size is normal. There is normal pulmonary artery systolic pressure. The  estimated right ventricular systolic pressure is 73.2 mmHg.  3. Left atrial size was mildly dilated.  4. The mitral valve is normal in structure. Trivial mitral valve  regurgitation. No evidence of mitral stenosis.  5. The aortic valve was not well visualized. There is moderate  calcification of the aortic valve. Aortic valve regurgitation is not  visualized. Mild to moderate aortic valve sclerosis/calcification is  present, without any evidence of aortic stenosis.  6. Aortic dilatation noted. There is mild dilatation of the aortic root,  measuring 38 mm.  7. The inferior vena cava is normal in size with <50% respiratory  variability,  suggesting right atrial pressure of 8 mmHg.    CT Head Wo Contrast  Result Date: 12/01/2020 CLINICAL DATA:  80 year old male with history of mental status change. EXAM: CT HEAD WITHOUT CONTRAST TECHNIQUE: Contiguous axial images were obtained from the base of the skull through the vertex without intravenous contrast. COMPARISON:  Head CT 06/02/2019. FINDINGS: Brain: Mild cerebral atrophy. Patchy areas of decreased attenuation are noted throughout the deep and periventricular white matter of the cerebral hemispheres bilaterally, compatible with mild chronic microvascular ischemic disease. No evidence of acute infarction, hemorrhage, hydrocephalus, extra-axial collection or mass lesion/mass effect. Vascular: No hyperdense vessel or unexpected calcification. Skull: Normal. Negative for fracture or focal lesion. Sinuses/Orbits: No acute finding. Other: None. IMPRESSION: 1. No acute intracranial abnormalities. 2. Mild cerebral atrophy with mild chronic microvascular ischemic changes in the cerebral white matter, as above. Electronically Signed   By: Vinnie Langton M.D.   On: 12/01/2020 13:53   ECHOCARDIOGRAM COMPLETE  Result Date: 12/02/2020    ECHOCARDIOGRAM REPORT   Patient Name:   Ralph Griffin. Date of Exam: 12/02/2020 Medical Rec #:  [id number]          Height:       70.5 in Accession #:    [id number]         Weight:       224.6 lb Date of Birth:  06-30-1941          BSA:          2.205 m Patient Age:    80 years           BP:           114/76 mmHg Patient Gender: M                  HR:           79 bpm. Exam Location:  Inpatient Procedure: 2D Echo Indications:  Syncope R55  History:         Patient has no prior history of Echocardiogram examinations.                  Risk Factors:Hypertension and Diabetes.  Sonographer:     Mikki Santee RDCS (AE) Referring Phys:  [id number] Jonnie Finner Diagnosing Phys: Fransico Him MD IMPRESSIONS  1. Left ventricular ejection fraction, by estimation, is 45 to  50%. The left ventricle has mildly decreased function. The left ventricle demonstrates global hypokinesis. Left ventricular diastolic function could not be evaluated.  2. Right ventricular systolic function is normal. The right ventricular size is normal. There is normal pulmonary artery systolic pressure. The estimated right ventricular systolic pressure is 90.3 mmHg.  3. Left atrial size was mildly dilated.  4. The mitral valve is normal in structure. Trivial mitral valve regurgitation. No evidence of mitral stenosis.  5. The aortic valve was not well visualized. There is moderate calcification of the aortic valve. Aortic valve regurgitation is not visualized. Mild to moderate aortic valve sclerosis/calcification is present, without any evidence of aortic stenosis.  6. Aortic dilatation noted. There is mild dilatation of the aortic root, measuring 38 mm.  7. The inferior vena cava is normal in size with <50% respiratory variability, suggesting right atrial pressure of 8 mmHg. FINDINGS  Left Ventricle: Left ventricular ejection fraction, by estimation, is 45 to 50%. The left ventricle has mildly decreased function. The left ventricle demonstrates global hypokinesis. The left ventricular internal cavity size was normal in size. There is  no left ventricular hypertrophy. Left ventricular diastolic function could not be evaluated due to atrial fibrillation. Left ventricular diastolic function could not be evaluated. Right Ventricle: The right ventricular size is normal. No increase in right ventricular wall thickness. Right ventricular systolic function is normal. There is normal pulmonary artery systolic pressure. The tricuspid regurgitant velocity is 1.79 m/s, and  with an assumed right atrial pressure of 8 mmHg, the estimated right ventricular systolic pressure is 00.9 mmHg. Left Atrium: Left atrial size was mildly dilated. Right Atrium: Right atrial size was normal in size. Pericardium: There is no evidence of  pericardial effusion. Mitral Valve: The mitral valve is normal in structure. Mild mitral annular calcification. Trivial mitral valve regurgitation. No evidence of mitral valve stenosis. Tricuspid Valve: The tricuspid valve is normal in structure. Tricuspid valve regurgitation is trivial. No evidence of tricuspid stenosis. Aortic Valve: The aortic valve was not well visualized. There is moderate calcification of the aortic valve. Aortic valve regurgitation is not visualized. Mild to moderate aortic valve sclerosis/calcification is present, without any evidence of aortic stenosis. Pulmonic Valve: The pulmonic valve was normal in structure. Pulmonic valve regurgitation is not visualized. No evidence of pulmonic stenosis. Aorta: Aortic dilatation noted. There is mild dilatation of the aortic root, measuring 38 mm. Venous: The inferior vena cava is normal in size with less than 50% respiratory variability, suggesting right atrial pressure of 8 mmHg. IAS/Shunts: No atrial level shunt detected by color flow Doppler.  LEFT VENTRICLE PLAX 2D LVIDd:         5.20 cm  Diastology LVIDs:         3.70 cm  LV e' medial:    6.48 cm/s LV PW:         1.10 cm  LV E/e' medial:  13.9 LV IVS:        0.90 cm  LV e' lateral:   8.59 cm/s LVOT diam:  2.50 cm  LV E/e' lateral: 10.5 LV SV:         52 LV SV Index:   23 LVOT Area:     4.91 cm  LEFT ATRIUM             Index       RIGHT ATRIUM           Index LA diam:        4.40 cm 2.00 cm/m  RA Area:     16.70 cm LA Vol (A2C):   91.4 ml 41.46 ml/m RA Volume:   36.60 ml  16.60 ml/m LA Vol (A4C):   76.8 ml 34.84 ml/m LA Biplane Vol: 89.0 ml 40.37 ml/m  AORTIC VALVE LVOT Vmax:   59.10 cm/s LVOT Vmean:  43.100 cm/s LVOT VTI:    0.105 m  AORTA Ao Root diam: 3.80 cm MITRAL VALVE               TRICUSPID VALVE MV Area (PHT): 3.23 cm    TR Peak grad:   12.8 mmHg MV Decel Time: 235 msec    TR Vmax:        179.00 cm/s MV E velocity: 90.00 cm/s MV A velocity: 33.70 cm/s  SHUNTS MV E/A ratio:  2.67         Systemic VTI:  0.10 m                            Systemic Diam: 2.50 cm Fransico Him MD Electronically signed by Fransico Him MD Signature Date/Time: 12/02/2020/11:33:31 AM    Final (Updated)        The results of significant diagnostics from this hospitalization (including imaging, microbiology, ancillary and laboratory) are listed below for reference.     Microbiology: Recent Results (from the past 240 hour(s))  SARS Coronavirus 2 by RT PCR (hospital order, performed in [facility] hospital lab) Nasopharyngeal Nasopharyngeal Swab     Status: None   Collection Time: 12/01/20 12:02 PM   Specimen: Nasopharyngeal Swab  Result Value Ref Range Status   SARS Coronavirus 2 NEGATIVE NEGATIVE Final    Comment: (NOTE) SARS-CoV-2 target nucleic acids are NOT DETECTED.  The SARS-CoV-2 RNA is generally detectable in upper and lower respiratory specimens during the acute phase of infection. The lowest concentration of SARS-CoV-2 viral copies this assay can detect is 250 copies / mL. A negative result does not preclude SARS-CoV-2 infection and should not be used as the sole basis for treatment or other patient management decisions.  A negative result may occur with improper specimen collection / handling, submission of specimen other than nasopharyngeal swab, presence of viral mutation(s) within the areas targeted by this assay, and inadequate number of viral copies (<250 copies / mL). A negative result must be combined with clinical observations, patient history, and epidemiological information.  Fact Sheet for Patients:   StrictlyIdeas.no  Fact Sheet for Healthcare Providers: BankingDealers.co.za  This test is not yet approved or  cleared by the [place] FDA and has been authorized for detection and/or diagnosis of SARS-CoV-2 by FDA under an Emergency Use Authorization (EUA).  This EUA will remain in effect (meaning this test can be  used) for the duration of the COVID-19 declaration under Section 564(b)(1) of the Act, 21 U.S.C. section 360bbb-3(b)(1), unless the authorization is terminated or revoked sooner.  Performed at [facility], [place] [address], [place], [place] [address]  Labs:  CBC: Recent Labs  Lab 11/28/20 0000 12/01/20 1201 12/02/20 0529  WBC 8.4 7.7 7.3  NEUTROABS 6.64  --   --   HGB 12.7* 10.1* 9.2*  HCT 40* 32.8* 29.9*  MCV  --  93.4 92.9  PLT 397 231 232   BMP &GFR Recent Labs  Lab 11/28/20 0000 12/01/20 1201 12/01/20 1706 12/02/20 0529  NA 137 137  --  136  K 4.9 3.9  --  3.9  CL 110* 110  --  109  CO2 20 20*  --  20*  GLUCOSE  --  103* 174* 126*  BUN 18 18  --  15  CREATININE 1.1 1.05  --  0.93  CALCIUM 8.8 7.6*  --  7.6*   Estimated Creatinine Clearance: 77.1 mL/min (by C-G formula based on SCr of 0.93 mg/dL). Liver & Pancreas: Recent Labs  Lab 11/28/20 0000 12/01/20 1201 12/02/20 0529  AST 40 25 26  ALT _0 ALKPHOS 340* 199* 181*  BILITOT  --  0.7 0.4  PROT  --  5.5* 5.0*  ALBUMIN 3.1* 1.9* 1.7*   No results for input(s): LIPASE, AMYLASE in the last 168 hours. No results for input(s): AMMONIA in the last 168 hours. Diabetic: Recent Labs    12/01/20 1201  HGBA1C 6.8*   Recent Labs  Lab 12/03/20 0043 12/03/20 0605 12/03/20 0748 12/03/20 1210 12/03/20 1508  GLUCAP 131* 108* 102* 204* 208*   Cardiac Enzymes: No results for input(s): CKTOTAL, CKMB, CKMBINDEX, TROPONINI in the last 168 hours. No results for input(s): PROBNP in the last 8760 hours. Coagulation Profile: No results for input(s): INR, PROTIME in the last 168 hours. Thyroid Function Tests: No results for input(s): TSH, T4TOTAL, FREET4, T3FREE, THYROIDAB in the last 72 hours. Lipid Profile: No results for input(s): CHOL, HDL, LDLCALC, TRIG, CHOLHDL, LDLDIRECT in the last 72 hours. Anemia Panel: No results for input(s): VITAMINB12, FOLATE, FERRITIN, TIBC, IRON,  RETICCTPCT in the last 72 hours. Urine analysis:    Component Value Date/Time   COLORURINE YELLOW 12/01/2020 [place] 12/01/2020 1201   LABSPEC 1.012 12/01/2020 1201   PHURINE 5.0 12/01/2020 [place] 12/01/2020 1201   HGBUR NEGATIVE 12/01/2020 [place] 12/01/2020 1201   KETONESUR NEGATIVE 12/01/2020 1201   PROTEINUR NEGATIVE 12/01/2020 1201   NITRITE NEGATIVE 12/01/2020 1201   [place] 12/01/2020 1201   Sepsis Labs: Invalid input(s): PROCALCITONIN, LACTICIDVEN   Time coordinating discharge: 45 minutes  SIGNED:  Mercy Riding, MD  [facility] 12/03/2020, 5:48 PM  If 7PM-7AM, please contact night-coverage [email or web address]

## 2020-12-03 NOTE — TOC Transition Note (Addendum)
Transition of Care [facility]) - CM/SW Discharge Note   Patient Details  Name: Ralph Griffin. MRN: [id number] Date of Birth: 1941/09/29  [facility]) CM/SW Contact:  Dessa Phi, RN Phone Number: 12/03/2020, 1:00 PM   Clinical Narrative: Spoke to spouse/dtr on phone-patient ws opend to [facility] they never were able to Start services d/t readmission to hospital. [facility]-ordered HHRN/PT/OT/aide-will fax orders once they are complete to fax#[phone]. Will schedule 4:30p w/c transport with [facility]-Nsg will f/u. No further CM needs.   1:35p CM attempted to call [facility] transport to schedule transport-unable to call ahead-Nsg will arrange w/c transport-ride waiver in shadow chart for patient signature,then nurse to fax to [phone], nurse to call [facility] transport [phone] for w/c transport home-confirmed address on face sheet.No further CM needs.     Final next level of care: [place] Barriers to Discharge: No Barriers Identified   Patient Goals and CMS Choice Patient states their goals for this hospitalization and ongoing recovery are:: go home CMS Medicare.gov Compare Post Acute Care list provided to:: Patient Represenative (must comment) Choice offered to / list presented to : [place]  Discharge Placement                       Discharge Plan and Services   Discharge Planning Services: CM Consult Post Acute Care Choice: Home Health                    HH Arranged: RN,PT,OT,Nurse's Aide [place]: [place] Date [place]: 12/03/20 Time [place]: 1300    Social Determinants of Health (SDOH) Interventions     Readmission Risk Interventions No flowsheet data found.

## 2020-12-03 NOTE — Progress Notes (Signed)
RN notified family of [facility] transportation service unable to transport pt home today. Wife, Ralph Griffin, notified RN that the son would come to transport the pt home. This was verified once more with the family. PTAR cancelled. Pt dressed in paper gown. IV removed. Pt given discharge instructions. All questions answered. Awaiting son to arrive to transport pt home.

## 2020-12-03 NOTE — Progress Notes (Signed)
Initial Nutrition Assessment  DOCUMENTATION CODES:   Non-severe (moderate) malnutrition in context of chronic illness  INTERVENTION:  - continue Ensure Enlive po BID, each supplement provides 350 kcal and 20 grams of protein. - will provide patient with Ensure coupons.  - will discuss with MD about change to appetite stimulant medication.  NUTRITION DIAGNOSIS:   Moderate Malnutrition related to chronic illness,cancer and cancer related treatments as evidenced by mild fat depletion,mild muscle depletion,moderate fat depletion,moderate muscle depletion.  GOAL:   Patient will meet greater than or equal to 90% of their needs  MONITOR:   PO intake,Supplement acceptance,Labs,Weight trends,Skin  REASON FOR ASSESSMENT:   Malnutrition Screening Tool  ASSESSMENT:   80 y.o. 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 He presented to the ED due to hypoglycemia. His family attempted to wake him up the morning of presentation, but he was gurgling and unresponsive so EMS was called. EMS found CBG to be 20 mg/dl and brought patient to the ED. Notes indicate that patient is chronically dehydrated and has needed several IV fluid infusions.  Per flow sheet documentation, patient consumed 50% of breakfast yesterday and 75% of breakfast today. Patient reports that breakfast today was a bagel, Cheerios, and milk.   Patient began chemo in October 2021 and since that time has had a decreased appetite, decreased desire to eat. This is in part due to lack of taste/bland taste of food. He is unsure if adding additional seasonings or spices would be beneficial but has found that certain foods, such as the beef soup he has had in the hospital, have more flavor and he is able to consume a large volume of them.   It was noted that patient was started on 7.5 mg remeron for appetite stimulant purposes and that  dose was increased to 15 mg in November. Patient has not found this to be beneficial even with dose increase.   He denies any chewing difficulties but does report some swallowing difficulties. He denies pain with swallowing but states that he sometimes feels that food is getting stuck in lower portion of esophagus; nothing specific that leads to this sensation.   He denies any abdominal pain or cramping with or without PO intakes. He denies any nausea, even when pushing himself to eat more than he would prefer.  Patient lives with his wife and has adult children who have been visiting and assisting in the home. Family cooks meals. When patient is not feeling hungry, or even to supplement what he does consume of the meals, he will drink Ensure supplements. He enjoys these supplements and is open to continuing to drink them outpatient. Ensure Enlive ordered BID and patient has accepted all 3 bottles provided here.  He denies any issues with mobility/ambulation although does report recurrent falls PTA despite having a wheelchair, walker, and power wheelchair at home.   Weight on 1/22 was 225 lb and patient reports UBW of 250 lb and that he last weighed this in October 2021. This indicates 25 lb weight loss (10% body weight) in the past 3-4 months; significant for time frame.     Labs reviewed; CBGs: 102-204 mg/dl, Ca: 7.6 mg/dl. Medications reviewed; 15 mg remeron/night.    NUTRITION - FOCUSED PHYSICAL EXAM:  Flowsheet Row Most Recent Value  Orbital Region Mild depletion  Upper Arm Region Severe depletion  Thoracic and Lumbar Region Unable to assess  Buccal Region Moderate depletion  Temple Region Mild depletion  Clavicle Bone Region Moderate depletion  Clavicle and Acromion Bone Region Severe depletion  Scapular Bone Region Moderate depletion  Dorsal Hand Mild depletion  Patellar Region No depletion  Anterior Thigh Region Mild depletion  Posterior Calf Region Mild depletion  Edema (RD  Assessment) None  Hair Reviewed  Eyes Reviewed  Mouth Reviewed  Skin Reviewed  Nails Reviewed       Diet Order:   Diet Order            Diet - low sodium heart healthy           Diet Carb Modified           Diet regular Room service appropriate? Yes; Fluid consistency: Thin  Diet effective now                 EDUCATION NEEDS:   Education needs have been addressed  Skin:  Skin Assessment: Skin Integrity Issues: Skin Integrity Issues:: Stage II,DTI DTI: L heel Stage II: sacrum  Last BM:  1/23 (type 6)  Height:   Ht Readings from Last 1 Encounters:  12/03/20 5\' 10"  (1.778 m)    Weight:   Wt Readings from Last 1 Encounters:  12/03/20 101.9 kg    Ideal Body Weight:  75.45 kg  BMI:  Body mass index is 32.23 kg/m.  Estimated Nutritional Needs:   Kcal:  7782-4235 kcal  Protein:  105-120 grams  Fluid:  >/= 2.5 L/day      Jarome Matin, MS, RD, LDN, CNSC Inpatient Clinical Dietitian RD pager # available in [facility]  After hours/weekend pager # available in [facility]

## 2020-12-03 NOTE — Evaluation (Signed)
Physical Therapy Evaluation Patient Details Name: Ralph Devery Sr. MRN: [id number] DOB: [birth year] Today's Date: 12/03/2020   History of Present Illness  80 y.o. male with medical history significant of S4 adenocarcinoma of the colon, a fib, DM2, anemia. Presenting with hypoglycemia  Clinical Impression  On eval, pt was Min guard-Min assist for mobility. He walked ~115 feet with a RW. Pt is mildly unsteady at times. He tolerated activity well. No c/o dizziness. Discussed d/c plan-pt plans to return home. He is agreeable to HHPT f/u. Will paln to follow and progress activity as tolerated.     Follow Up Recommendations Home health PT    Equipment Recommendations  None recommended by PT    Recommendations for Other Services       Precautions / Restrictions Precautions Precautions: Fall Restrictions Weight Bearing Restrictions: No      Mobility  Bed Mobility Overal bed mobility: Needs Assistance Bed Mobility: Supine to Sit;Sit to Supine     Supine to sit: Min assist;HOB elevated Sit to supine: Min assist;HOB elevated   General bed mobility comments: Small amount of assist for trunk and LEs.    Transfers Overall transfer level: Needs assistance Equipment used: Rolling walker (2 wheeled) Transfers: Sit to/from Stand Sit to Stand: Min guard         General transfer comment: Min guard for safety.  Ambulation/Gait Ambulation/Gait assistance: Min guard Gait Distance (Feet): 115 Feet Assistive device: Rolling walker (2 wheeled) Gait Pattern/deviations: Step-through pattern;Decreased stride length     General Gait Details: Min guard for safety. A bit unsafe at times but no LOB or physical assistance given.  Stairs            Wheelchair Mobility    Modified Rankin (Stroke Patients Only)       Balance Overall balance assessment: Needs assistance         Standing balance support: Bilateral upper extremity supported Standing balance-Leahy Scale: Fair                                Pertinent Vitals/Pain Pain Assessment: No/denies pain    Home Living Family/patient expects to be discharged to:: Private residence Living Arrangements: Children;Spouse/significant other Available Help at Discharge: Family;Available 24 hours/day Type of Home: Apartment Home Access: Ramped entrance;Level entry     Home Layout: One level Home Equipment: [place] - 2 wheels;Bedside commode;Tub bench;Grab bars - tub/shower;Hand held shower head Additional Comments: reports he does fall (has bruise on left knee)    Prior Function Level of Independence: Independent with assistive device(s)               Hand Dominance   Dominant Hand: Left    Extremity/Trunk Assessment   Upper Extremity Assessment Upper Extremity Assessment: Defer to OT evaluation    Lower Extremity Assessment Lower Extremity Assessment: Generalized weakness    Cervical / Trunk Assessment Cervical / Trunk Assessment: Normal  Communication   Communication: HOH  Cognition Arousal/Alertness: Awake/alert Behavior During Therapy: WFL for tasks assessed/performed Overall Cognitive Status: Within Functional Limits for tasks assessed                                 General Comments: Could be more safe with RW      General Comments      Exercises     Assessment/Plan    PT Assessment Patient needs continued  PT services  PT Problem List Decreased strength;Decreased mobility;Decreased activity tolerance;Decreased balance       PT Treatment Interventions DME instruction;Gait training;Therapeutic exercise;Therapeutic activities;Patient/family education;Balance training;Functional mobility training    PT Goals (Current goals can be found in the Care Plan section)  Acute Rehab PT Goals Patient Stated Goal: to go home today PT Goal Formulation: With patient Time For Goal Achievement: 12/17/20 Potential to Achieve Goals: Good    Frequency Min  3X/week   Barriers to discharge        Co-evaluation               AM-PAC PT "6 Clicks" Mobility  Outcome Measure Help needed turning from your back to your side while in a flat bed without using bedrails?: A Little Help needed moving from lying on your back to sitting on the side of a flat bed without using bedrails?: A Little Help needed moving to and from a bed to a chair (including a wheelchair)?: A Little Help needed standing up from a chair using your arms (e.g., wheelchair or bedside chair)?: A Little Help needed to walk in hospital room?: A Little Help needed climbing 3-5 steps with a railing? : A Little 6 Click Score: 18    End of Session Equipment Utilized During Treatment: Gait belt Activity Tolerance: Patient tolerated treatment well Patient left: with call bell/phone within reach   PT Visit Diagnosis: Unsteadiness on feet (R26.81);Muscle weakness (generalized) (M62.81)    Time: 1000-1013 PT Time Calculation (min) (ACUTE ONLY): 13 min   Charges:   PT Evaluation $PT Eval Moderate Complexity: 1 Mod            Doreatha Massed, PT Acute Rehabilitation  Office: [phone] Pager: [phone]

## 2020-12-05 ENCOUNTER — Other Ambulatory Visit: Payer: Self-pay | Admitting: Oncology

## 2020-12-05 ENCOUNTER — Inpatient Hospital Stay: Payer: Medicare HMO

## 2020-12-05 ENCOUNTER — Encounter: Payer: Self-pay | Admitting: Oncology

## 2020-12-05 ENCOUNTER — Telehealth: Payer: Self-pay | Admitting: Oncology

## 2020-12-05 ENCOUNTER — Other Ambulatory Visit: Payer: Self-pay

## 2020-12-05 ENCOUNTER — Inpatient Hospital Stay (INDEPENDENT_AMBULATORY_CARE_PROVIDER_SITE_OTHER): Payer: Medicare HMO | Admitting: Oncology

## 2020-12-05 VITALS — BP 113/56 | HR 107 | Temp 97.4°F | Resp 18 | Ht 70.0 in | Wt 227.8 lb

## 2020-12-05 DIAGNOSIS — C787 Secondary malignant neoplasm of liver and intrahepatic bile duct: Secondary | ICD-10-CM

## 2020-12-05 DIAGNOSIS — C20 Malignant neoplasm of rectum: Secondary | ICD-10-CM

## 2020-12-05 LAB — CBC AND DIFFERENTIAL
HCT: 34 — AB (ref 41–53)
Hemoglobin: 10.8 — AB (ref 13.5–17.5)
Neutrophils Absolute: 4.88
Platelets: 282 (ref 150–399)
WBC: 7.5

## 2020-12-05 LAB — HEPATIC FUNCTION PANEL
ALT: 22 (ref 10–40)
AST: 39 (ref 14–40)
Alkaline Phosphatase: 375 — AB (ref 25–125)
Bilirubin, Total: 0.4

## 2020-12-05 LAB — BASIC METABOLIC PANEL
BUN: 20 (ref 4–21)
CO2: 23 — AB (ref 13–22)
Chloride: 110 — AB (ref 99–108)
Creatinine: 1.1 (ref 0.6–1.3)
Glucose: 245
Potassium: 5.6 — AB (ref 3.4–5.3)
Sodium: 137 (ref 137–147)

## 2020-12-05 LAB — COMPREHENSIVE METABOLIC PANEL WITH GFR
Albumin: 2.8 — AB (ref 3.5–5.0)
Calcium: 8.8 (ref 8.7–10.7)

## 2020-12-05 LAB — CBC: RBC: 3.75 — AB (ref 3.87–5.11)

## 2020-12-05 NOTE — Telephone Encounter (Signed)
Per 1/26 LOS, Patient scheduled for 2/8 Labs, Follow Up

## 2020-12-06 ENCOUNTER — Other Ambulatory Visit: Payer: Self-pay | Admitting: Hematology and Oncology

## 2020-12-06 ENCOUNTER — Telehealth: Payer: Self-pay

## 2020-12-06 ENCOUNTER — Telehealth: Payer: Self-pay | Admitting: *Deleted

## 2020-12-06 DIAGNOSIS — G4700 Insomnia, unspecified: Secondary | ICD-10-CM

## 2020-12-06 NOTE — Telephone Encounter (Signed)
Spoke with Dorian Pod at [facility] health and notified her to resume his home health orders from Dr. Hinton Rao. Also she wants warm compresses and to observe his right hand from phlebitis that he made need antibiotics. And to apply heel booties to his foot wounds.

## 2020-12-06 NOTE — Telephone Encounter (Signed)
-----   Message from Derwood Kaplan, MD sent at 12/05/2020  4:43 PM EST ----- Regarding: outside order [place] going out, and PT.  Ask them to monitor the phlebitis of the dorsum of his right hand.  I rec warm compresses but may need ab's if worsens. Also, can we get heel booties for the pressure sore of his heel?

## 2020-12-06 NOTE — Telephone Encounter (Signed)
Pt received moderna 1st dose on 12-24-2019 and 2nd dose on 01-21-2020 at [place] store number [phone]. Pt will get booster in [place]

## 2020-12-06 NOTE — Telephone Encounter (Signed)
-----   Message from Marvia Pickles, PA-C sent at 12/05/2020  4:48 PM EST ----- Regarding: FW: Medications flagged  ----- Message ----- From: Dairl Ponder, RN Sent: 12/05/2020   4:07 PM EST To: Marvia Pickles, PA-C Subject: Medications flagged                            Received call from [facility], with [facility]. She is asking for resumption of home health orders, as written before pt hospitalization over the weekend. Pt was discharged home yesterday. Also 2 of his medications flagged as interactions, so she needed to make you aware of those, as you are listed on his last orders.  Medications are escitalopram and remeron.  [phone]

## 2020-12-07 ENCOUNTER — Telehealth: Payer: Self-pay

## 2020-12-07 ENCOUNTER — Other Ambulatory Visit: Payer: Self-pay

## 2020-12-07 NOTE — Telephone Encounter (Signed)
-----   Message from Dairl Ponder, RN sent at 12/06/2020  4:54 PM EST ----- Regarding: FW: Medications flagged  ----- Message ----- From: Marvia Pickles, PA-C Sent: 12/06/2020   8:18 AM EST To: Dairl Ponder, RN Subject: RE: Medications flagged                        Loma Sousa was going to call them last night, will you confirm? Thanks! ----- Message ----- From: Dairl Ponder, RN Sent: 12/05/2020   4:07 PM EST To: Marvia Pickles, PA-C Subject: Medications flagged                            Received call from [place], with [facility]. She is asking for resumption of home health orders, as written before pt hospitalization over the weekend. Pt was discharged home yesterday. Also 2 of his medications flagged as interactions, so she needed to make you aware of those, as you are listed on his last orders.  Medications are escitalopram and remeron.  [phone]

## 2020-12-07 NOTE — Telephone Encounter (Addendum)
I called Steve,PT, for [facility], and notified him that Dr Hinton Rao agreed to requested orders.Marland Kitchen   ----- Message from Derwood Kaplan, MD sent at 12/07/2020  1:39 PM EST ----- Regarding: RE: [place] yes ----- Message ----- From: Dairl Ponder, RN Sent: 12/07/2020   1:16 PM EST To: Derwood Kaplan, MD Subject: PHYSICAL THERAPY ORDERS                        Steve,PT with [facility], called to get orders for the following.. PT visits twice weekly for 3 weeks, then once weekly for 4 weeks, for strength, conditioning, gait stability and home safety. [phone]

## 2020-12-07 NOTE — Telephone Encounter (Signed)
Spoke with Claiborne Billings at [facility] and verified that he does have current home health orders and Hinton Rao is aware of the drug interactions.

## 2020-12-10 ENCOUNTER — Telehealth: Payer: Self-pay

## 2020-12-10 NOTE — Telephone Encounter (Signed)
Kelly,RN, with [facility], called to get orders for wound care to pt's left foot. Apply medi honey to wound, then apply calcium collagenate silver, & cover. Dressing to be changed 3 times weekly.   Called Kelly with [facility] and told her the wound orders were fine to do.

## 2020-12-10 NOTE — Telephone Encounter (Signed)
Kelly,RN, with [facility], called to get orders for wound care to pt's left foot. Apply medi honey to wound, then apply calcium collagenate silver, & cover. Dressing to be changed 3 times weekly.

## 2020-12-12 DIAGNOSIS — E875 Hyperkalemia: Secondary | ICD-10-CM | POA: Insufficient documentation

## 2020-12-17 NOTE — Progress Notes (Signed)
[name]  [address] [place],  [place]  [id number] [phone]  Clinic Day:  12/18/2020  Referring physician: Raina Mina., MD   This document serves as a record of services personally performed by Hosie Poisson, MD. It was created on their behalf by [facility], a trained medical scribe. The creation of this record is based on the scribe's personal observations and the provider's statements to them.   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   Current Treatment:  Supportive care  HISTORY OF PRESENT ILLNESS:  Ralph Griffin is a 80 y.o. 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  He was referred from the emergency department for the evaluation and treatment of adenocarcinoma of the cecum.  He presented to the [facility] emergency department on September 24th due to abdominal discomfort, melena, hematochezia, anorexia and weight loss.  He had been started on apixaban the week prior by his primary care physician for atrial fibrillation.  However, in 3-4 days the patient started having black stool and bright red blood per rectum.  This medication was held as advised by his primary physician and he was advised to present to the ER.  While in the ER he was started on pantoprazole drip due to concern for GI bleed and GI services was consulted.  CTA imaging revealed findings most consistent with metastatic colorectal carcinoma with innumerable hypoattenuating masses throughout the liver, the largest measuring 4.8 cm, and both regional and distal nodal metastatic disease.  There was also a growing exophytic nodule arising from the right kidney measuring 2.7 cm, previously 2.2 cm, concerning for renal cell carcinoma.  There was no evidence of active arterial GI bleeding.  He underwent EGD and total colonoscopy with  biopsy on September 25th, which revealed several large nonobstructing malignant appearing masses in the cecum.  Surgical pathology from this procedure confirmed adenocarcinoma with necrosis.  MMR was normal.  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 Iron studies were notable for iron deficiency with an iron level of 12.0 with a TIBC of 242 for a percent saturation of 4.9%.  The patient was given IV Feraheme and started on oral supplement 325 mg BID. His iron supplement was subsequently increased to TID. He was discharged on September 26th, and lab work at that time was unremarkable except for a hemoglobin of 8.3, and a calcium of 8.2, and an elevated alkaline phosphatase.  He had his first cycle of FOLFOX/bevacizumab on October 13th and tolerated this fairly well.  As he has continued to lose weight, he was placed on mirtazapine 7.5 mg at bedtime to improve his appetite, this was subsequently increased to 15 mg at bedtime.  The CEA has steadily decreased from 57.2 down to 25.2 in November and 12.9 in December.   CT abdomen and pelvis on January 8th revealed a mixed response with an increase in the porta hepatis lymph nodes from previous and new small bilateral pleural effusions right greater than left.  The liver metastasis was fairly stable.  There was a decrease in the cecal mass from previous. The exophytic right kidney nodule measured approximately 2.3 cm, which was stable to improved.  A chest x-ray revealed a new mild opacity in the medial right lung base felt to be atelectasis versus infiltrate.  He was hospitalized for profound hypoglycemia with a blood sugar of 21 in January.  When I saw him last, he had a high potassium and Dr. Bea Graff ordered sorbitol.    INTERVAL HISTORY:  He is here for follow up and continued supportive care.  He has been placed on SPS by Dr. Bea Graff as his potassium had been elevated.  He states  that he has two more days of this medication.  He states that he feels well other than poor appetite.  He still has his port which was last used on January 20th for IV fluids.  His hemoglobin has improved from 10.8 to 11.3, and his white count and platelets are normal.  Chemistries are remarkable for a blood glucose of 200.  His  appetite is poor, and he has lost 2 pounds since his last visit.  He has been supplementing with Glucerna.  He denies fever, chills or other signs of infection.  He denies nausea, vomiting, bowel issues, or abdominal pain.  He denies sore throat, cough, dyspnea, or chest pain.  REVIEW OF SYSTEMS:  Review of Systems  Constitutional: Positive for appetite change (poor appetite) and unexpected weight change (weight loss).  HENT:  Negative.   Eyes: Negative.   Respiratory: Negative.   Cardiovascular: Negative.   Gastrointestinal: Negative.  Negative for abdominal pain, blood in stool, constipation, diarrhea, nausea and vomiting.  Endocrine: Negative.   Genitourinary: Negative.    Musculoskeletal: Negative.   Skin: Negative.   Neurological: Negative.   Hematological: Negative.   Psychiatric/Behavioral: Negative.   All other systems reviewed and are negative.    VITALS:  Vital signs today include a weight of 225.6 lb, blood pressure 107/61, pulse 100, respirations 18 and temperature 97.9.  Pulse oximetry is 96% on room air. Wt Readings from Last 3 Encounters:  12/18/20 225 lb 9.6 oz (102.3 kg)  12/05/20 227 lb 12.8 oz (103.3 kg)  12/03/20 224 lb 10.4 oz (101.9 kg)    Body mass index is 32.37 kg/m.  Performance status (ECOG): 1 - Symptomatic but completely ambulatory  PHYSICAL EXAM:  Physical Exam Constitutional:      General: He is not in acute distress.    Appearance: Normal appearance. He is normal weight.  HENT:     Head: Normocephalic and atraumatic.  Eyes:     General: No scleral icterus.    Extraocular Movements: Extraocular movements intact.      Conjunctiva/sclera: Conjunctivae normal.     Pupils: Pupils are equal, round, and reactive to light.  Cardiovascular:     Rate and Rhythm: Normal rate and regular rhythm.     Pulses: Normal pulses.     Heart sounds: Normal heart sounds. No murmur heard. No friction rub. No gallop.   Pulmonary:     Effort: Pulmonary effort is normal. No respiratory distress.     Breath sounds: Normal breath sounds.  Abdominal:     General: Bowel sounds are normal. There is no distension.     Palpations: Abdomen is soft. There is hepatomegaly. There is no splenomegaly or mass.     Tenderness: There is no abdominal tenderness.  Musculoskeletal:        General: Normal range of motion.     Cervical back: Normal range of motion and neck supple.     Right lower leg: Edema (mild) present.     Left lower leg: Edema (mild) present.  Lymphadenopathy:     Cervical: No cervical adenopathy.  Skin:    General:  Skin is warm and dry.  Neurological:     General: No focal deficit present.     Mental Status: He is alert and oriented to person, place, and time. Mental status is at baseline.  Psychiatric:        Mood and Affect: Mood normal.        Behavior: Behavior normal.        Thought Content: Thought content normal.        Judgment: Judgment normal.    LABS:    CBC Latest Ref Rng & Units 12/05/2020 12/02/2020 12/01/2020  WBC - 7.5 7.3 7.7  Hemoglobin 13.5 - 17.5 10.8(A) 9.2(L) 10.1(L)  Hematocrit 41 - 53 34(A) 29.9(L) 32.8(L)  Platelets 150 - 399 282 232 231   CMP Latest Ref Rng & Units 12/05/2020 12/02/2020 12/01/2020  Glucose 70 - 99 mg/dL - 126(H) 174(H)  BUN 4 - _0 -  Creatinine 0.6 - 1.3 1.1 0.93 -  Sodium 137 - 147 137 136 -  Potassium 3.4 - 5.3 5.6(A) 3.9 -  Chloride 99 - 108 110(A) 109 -  CO2 13 - 22 23(A) 20(L) -  Calcium 8.7 - 10.7 8.8 7.6(L) -  Total Protein 6.5 - 8.1 g/dL - 5.0(L) -  Total Bilirubin 0.3 - 1.2 mg/dL - 0.4 -  Alkaline Phos 25 - 125 375(A) 181(H) -  AST 14 - 40 39 26 -   ALT 10 - 40 22 17 -     Lab Results  Component Value Date   CEA1 12.9 (H) 11/01/2020   /  CEA  Date Value Ref Range Status  11/01/2020 12.9 (H) 0.0 - 4.7 ng/mL Final    Comment:    (NOTE)                             Nonsmokers          <3.9                             Smokers             <5.6 [employer] Electrochemiluminescence Immunoassay (ECLIA) Values obtained with different assay methods or kits cannot be used interchangeably.  Results cannot be interpreted as absolute evidence of the presence or absence of malignant disease. Performed At: [facility] [place], [place] [id number] Rush Farmer MD JZ:[id number]       STUDIES:   No current studies  Allergies:  Allergies  Allergen Reactions  . Chlorhexidine     Port a cath not accessed     Current Medications: Current Outpatient Medications  Medication Sig Dispense Refill  . Magnesium Oxide 200 MG TABS Take by mouth.    Marland Kitchen allopurinol (ZYLOPRIM) 100 MG tablet Take 100 mg by mouth daily.    Marland Kitchen aspirin EC 81 MG tablet Take 81 mg by mouth daily.    Marland Kitchen atorvastatin (LIPITOR) 20 MG tablet Take 20 mg by mouth daily.    . Blood Glucose Monitoring Suppl (ONETOUCH VERIO FLEX SYSTEM) w/Device KIT CHECK BLOOD SUGARS ONCE A DAY    . carvedilol (COREG) 12.5 MG tablet Take 12.5 mg by mouth 2 (two) times daily with a meal.    . cholecalciferol (VITAMIN D3) 25 MCG (1000 UT) tablet Take 2,000 Units by mouth daily.    Marland Kitchen escitalopram (LEXAPRO) 10 MG tablet Take 10 mg by mouth daily.    Marland Kitchen  ferrous sulfate 325 (65 FE) MG tablet Take 325 mg by mouth daily with breakfast.    . gabapentin (NEURONTIN) 100 MG capsule Take 100 mg by mouth 2 (two) times daily.    Marland Kitchen HYDROcodone-acetaminophen (NORCO/VICODIN) 5-325 MG tablet Take by mouth.    . magic mouthwash w/lidocaine SOLN Take 5 mLs by mouth 4 (four) times daily as needed for mouth pain. 240 mL 0  . metFORMIN (GLUCOPHAGE) 500 MG tablet Take 1,000 mg by mouth  2 (two) times daily.    . mirtazapine (REMERON) 15 MG tablet TAKE 1 TABLET BY MOUTH EVERYDAY AT BEDTIME 30 tablet 2  . Multiple Vitamins-Minerals (CENTRUM SILVER 50+MEN PO) Take 1 tablet by mouth daily.    Marland Kitchen omeprazole (PRILOSEC) 40 MG capsule Take 40 mg by mouth daily.    . ondansetron (ZOFRAN) 4 MG tablet Take 1 tablet (4 mg total) by mouth every 4 (four) hours as needed for nausea. 90 tablet 3  . OneTouch Delica Lancets 20N MISC CHECK BLOOD SUGAR EVERY DAY AS DIRECTED.    Marland Kitchen ONETOUCH VERIO test strip daily.    . prochlorperazine (COMPAZINE) 10 MG tablet Take 1 tablet (10 mg total) by mouth every 6 (six) hours as needed for nausea or vomiting. 90 tablet 3  . rOPINIRole (REQUIP) 3 MG tablet Take 3 mg by mouth at bedtime.    . Semaglutide,0.25 or 0.5MG/DOS, (OZEMPIC, 0.25 OR 0.5 MG/DOSE,) 2 MG/1.5ML SOPN Inject 1 mg into the skin once a week. Friday    . Skin Protectants, Misc. (DIMETHICONE-ZINC OXIDE) cream     . SPS 15 GM/60ML suspension TAKE 60 MLS (15 G TOTAL) BY MOUTH DAILY FOR 5 DAYS.    Marland Kitchen vitamin B-12 (CYANOCOBALAMIN) 1000 MCG tablet Take 1,000 mcg by mouth daily.     No current facility-administered medications for this visit.     ASSESSMENT & PLAN:   Assessment:   1. 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  We do not advise surgical resection or radiation.  MMR were normal. 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  He has regional and distal nodal metastatic disease.   He tolerated his last cycle of palliative FOLFOX/bevacizumab poorly due to fatigue, general weakness and decreased appetite.  Recent CT imaging in the emergency room revealed mixed response with a decrease in his cecal mass and stable liver metastasis, but an increase of the porta hepatis lymphadenopathy, as well as new small bilateral pleural effusions.  We do not feel he could  tolerate further aggressive chemotherapy.  2. Innumerable hypoattenuating masses throughout the liver consistent with metastatic disease, which appears fairly stable on most recent CT imaging.  3. Iron deficiency anemia for which he is on oral iron supplement three times daily.  I told them they could decrease the iron to daily for now.  His hemoglobin has improved.  4. Exophytic nodule arising from the right kidney measuring 2.7 cm.  This has mildly enlarged since prior study, but has been present for the past 3 years.  This measured 2.3 cm on recent CT.  5. Complicated pain from issues with his spine, for which he sees pain management.  He is currently on hydrocodone/APAP as needed.  6.  Numbness and paresthesias of his fingers, from prior spinal surgery.  This remains stable.   7. Possible early pneumonia on chest x-ray from January 8th, treated  with azithromycin.  He is without symptoms of infection.  8.  Severe debilitation, which is slowly improving with the help of home health and home physical therapy.  We stressed the importance of using nutritional supplements and/or eating several small meals a day in order to avoid further general decline.  We still don't think his performance status could support an aggressive chemotherapy approach.  Plan:     I doubt that he would not be able to tolerate any further aggressive chemotherapy, and unfortunately the next treatment of choice would be FOLFIRI and this can have significant toxicities.  We discussed supportive care so that he can focus on quality of life, and he and his son are in agreement.  If needed, we can call in Hospice and we reviewed this today.  He continue to struggle with his appetite despite Remeron 30 mg at bedtime, and he knows to importance of keeping up with regular meals.  We will plan to see him back in 2 weeks with CBC, CMP and CEA for continued supportive care. His port was last accessed on January 20th for IV fluids. The  patient and his family understand the plans discussed today and are in agreement with them.  They know to contact our office if he develops issues during immediate clinical assessment.   We provided 30 minutes of face-to-face time during this this encounter and > 50% was spent counseling as documented under my assessment and plan.    Derwood Kaplan, MD [facility] AT [facility] [address] [place] [address] Dept: [phone] Dept Fax: [phone]

## 2020-12-18 ENCOUNTER — Inpatient Hospital Stay: Payer: Medicare HMO | Attending: Oncology

## 2020-12-18 ENCOUNTER — Inpatient Hospital Stay (INDEPENDENT_AMBULATORY_CARE_PROVIDER_SITE_OTHER): Payer: Medicare HMO | Admitting: Oncology

## 2020-12-18 ENCOUNTER — Telehealth: Payer: Self-pay | Admitting: Oncology

## 2020-12-18 ENCOUNTER — Other Ambulatory Visit: Payer: Self-pay | Admitting: Hematology and Oncology

## 2020-12-18 ENCOUNTER — Encounter: Payer: Self-pay | Admitting: Oncology

## 2020-12-18 ENCOUNTER — Other Ambulatory Visit: Payer: Self-pay

## 2020-12-18 VITALS — BP 107/61 | HR 100 | Temp 97.9°F | Resp 18 | Ht 70.0 in | Wt 225.6 lb

## 2020-12-18 DIAGNOSIS — Z7984 Long term (current) use of oral hypoglycemic drugs: Secondary | ICD-10-CM | POA: Insufficient documentation

## 2020-12-18 DIAGNOSIS — C180 Malignant neoplasm of cecum: Secondary | ICD-10-CM | POA: Diagnosis not present

## 2020-12-18 DIAGNOSIS — R200 Anesthesia of skin: Secondary | ICD-10-CM | POA: Diagnosis not present

## 2020-12-18 DIAGNOSIS — Z7982 Long term (current) use of aspirin: Secondary | ICD-10-CM | POA: Diagnosis not present

## 2020-12-18 DIAGNOSIS — C20 Malignant neoplasm of rectum: Secondary | ICD-10-CM

## 2020-12-18 DIAGNOSIS — C787 Secondary malignant neoplasm of liver and intrahepatic bile duct: Secondary | ICD-10-CM

## 2020-12-18 DIAGNOSIS — K921 Melena: Secondary | ICD-10-CM | POA: Insufficient documentation

## 2020-12-18 DIAGNOSIS — R600 Localized edema: Secondary | ICD-10-CM | POA: Diagnosis not present

## 2020-12-18 DIAGNOSIS — R634 Abnormal weight loss: Secondary | ICD-10-CM | POA: Insufficient documentation

## 2020-12-18 DIAGNOSIS — R202 Paresthesia of skin: Secondary | ICD-10-CM | POA: Insufficient documentation

## 2020-12-18 DIAGNOSIS — R630 Anorexia: Secondary | ICD-10-CM | POA: Insufficient documentation

## 2020-12-18 DIAGNOSIS — R531 Weakness: Secondary | ICD-10-CM | POA: Diagnosis not present

## 2020-12-18 DIAGNOSIS — Z79899 Other long term (current) drug therapy: Secondary | ICD-10-CM | POA: Insufficient documentation

## 2020-12-18 DIAGNOSIS — J90 Pleural effusion, not elsewhere classified: Secondary | ICD-10-CM | POA: Insufficient documentation

## 2020-12-18 DIAGNOSIS — D509 Iron deficiency anemia, unspecified: Secondary | ICD-10-CM | POA: Insufficient documentation

## 2020-12-18 DIAGNOSIS — C779 Secondary and unspecified malignant neoplasm of lymph node, unspecified: Secondary | ICD-10-CM | POA: Diagnosis not present

## 2020-12-18 LAB — CORRECTED CALCIUM (CC13): Calcium, Corrected: 9.2

## 2020-12-18 LAB — CBC
MCV: 94 (ref 80–94)
RBC: 3.87 (ref 3.87–5.11)

## 2020-12-18 LAB — BASIC METABOLIC PANEL
BUN: 18 (ref 4–21)
CO2: 21 (ref 13–22)
Chloride: 109 — AB (ref 99–108)
Creatinine: 1 (ref 0.6–1.3)
Glucose: 200
Potassium: 4.8 (ref 3.4–5.3)
Sodium: 137 (ref 137–147)

## 2020-12-18 LAB — CBC AND DIFFERENTIAL
HCT: 37 — AB (ref 41–53)
Hemoglobin: 11.3 — AB (ref 13.5–17.5)
Neutrophils Absolute: 3.95
Platelets: 330 (ref 150–399)
WBC: 5.9

## 2020-12-18 LAB — HEPATIC FUNCTION PANEL
ALT: 22 (ref 10–40)
AST: 43 — AB (ref 14–40)
Alkaline Phosphatase: 421 — AB (ref 25–125)
Bilirubin, Total: 0.6

## 2020-12-18 LAB — COMPREHENSIVE METABOLIC PANEL
Albumin: 3 — AB (ref 3.5–5.0)
Calcium: 8.2 — AB (ref 8.7–10.7)

## 2020-12-18 NOTE — Telephone Encounter (Signed)
Per 2/8 los next appt sched and given to patient 

## 2020-12-18 NOTE — Progress Notes (Unsigned)
t

## 2020-12-19 LAB — CEA: CEA: 10.1 ng/mL — ABNORMAL HIGH (ref 0.0–4.7)

## 2020-12-20 ENCOUNTER — Telehealth: Payer: Self-pay

## 2020-12-20 NOTE — Telephone Encounter (Signed)
Received call from [facility] with [facility]. He is requesting OT orders for OT visit twice weekly for 2 weeks, then once weekly for 3 weeks. [phone].

## 2020-12-24 ENCOUNTER — Telehealth: Payer: Self-pay

## 2020-12-24 NOTE — Telephone Encounter (Addendum)
I then call Claiborne Billings, RN, w/[facility], to make her aware that I spoken with pt's son.    I spoke with pt's son, Ladarren, [place]., as he had LVM on triage line, to report the same thing about the swelling. I notified him of Kelli,PA, response. He states he drinks a protein shake daily. I encouraged him to have his father increase to another 2-3 servings. Pt son verbalized understanding.  ----- Message from Marvia Pickles, PA-C sent at 12/24/2020  1:02 PM EST ----- Regarding: RE: [facility] nsg visits, new edema feet & legs The edema is due to low albumin and protein, so fluid pill won't help. It can help to increase protein in diet. Thanks! ----- Message ----- From: Dairl Ponder, RN Sent: 12/24/2020  12:18 PM EST To: Marvia Pickles, PA-C Subject: [place] nsg visits, new edema feet & legs           Received call from [place], w/ [facility]. She went out to see pt this morning for wound care. She is requesting a to increase her frequency to once weekly until precert date is completed. Also, she wanted to make [place] aware that he has developed 4+ edema bilateral legs & feet, which is new for him. Pt denies increased salt intake, & told her he used to be on a fluid pill. Vida Roller, states she told him that his BP runs on the low side, and will have to notify the Dr. Abbott Pao was instructed to keep his legs elvated as much as possible to help with swelling. She mentioned that she had discussed Hospice again with them last week, but they are still not ready for that.

## 2020-12-27 DIAGNOSIS — L97422 Non-pressure chronic ulcer of left heel and midfoot with fat layer exposed: Secondary | ICD-10-CM | POA: Insufficient documentation

## 2020-12-31 NOTE — Progress Notes (Signed)
Ralph Griffin  [address] [place],  [place]  [address] [phone]  Clinic Day:  01/02/2021  Referring physician: Raina Mina., MD  This document serves as a record of services personally performed by Hosie Poisson, MD. It was created on their behalf by [facility], a trained medical scribe. The creation of this record is based on the scribe's personal observations and the provider's statements to them.  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 is a 80 y.o. 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  He was referred from the emergency department for the evaluation and treatment of adenocarcinoma of the cecum.  He presented to the [facility] emergency department on September 24th due to abdominal discomfort, melena, hematochezia, anorexia and weight loss.  He had been started on apixaban the week prior by his primary care physician for atrial fibrillation.  However, in 3-4 days the patient started having black stool and bright red blood per rectum.  This medication was held as advised by his primary physician and he was advised to present to the ER.  While in the ER he was started on pantoprazole drip due to concern for GI bleed and GI services was consulted.  CTA imaging revealed findings most consistent with metastatic colorectal carcinoma with innumerable hypoattenuating masses throughout the liver, the largest measuring 4.8 cm, and both regional and distal nodal metastatic disease.  There was also a growing exophytic nodule arising from the right kidney measuring 2.7 cm, previously 2.2 cm, concerning for renal cell carcinoma.  There was no evidence of active arterial GI bleeding.  He underwent EGD and total  colonoscopy with biopsy on September 25th, which revealed several large nonobstructing malignant appearing masses in the cecum.  Surgical pathology from this procedure confirmed adenocarcinoma with necrosis.  MMR was normal.  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 Iron studies were notable for iron deficiency with an iron level of 12.0 with a TIBC of 242 for a percent saturation of 4.9%.  The patient was given IV Feraheme and started on oral supplement 325 mg BID. His iron supplement was subsequently increased to TID. He was discharged on September 26th, and lab work at that time was unremarkable except for a hemoglobin of 8.3, and a calcium of 8.2, and an elevated alkaline phosphatase.  He had his first cycle of FOLFOX/bevacizumab on October 13th and tolerated this fairly well.  As he has continued to lose weight, he was placed on mirtazapine 7.5 mg at bedtime to improve his appetite, this was subsequently increased to 15 mg at bedtime.  The CEA has steadily decreasedfrom 57.2 down to 25.2 in November and 12.9 in December.   CT abdomen and pelvis on January 8th revealed a mixed response with an increase in the porta hepatis lymph nodes from previous and new small bilateral pleural effusions right greater than left.  The liver metastasis was fairly stable.  There was a decrease in the cecal mass from previous. The exophytic right kidney nodule measured approximately 2.3 cm, which was stable to improved.  A chest x-ray revealed a new mild opacity in the medial right lung base felt to be atelectasis versus infiltrate.  He was hospitalized for profound hypoglycemia with a blood sugar of 21 in January.  He had a high potassium in January and Dr. Bea Graff ordered sorbitol.    INTERVAL HISTORY:  He is here for continued supportive care.  He states that he is doing well and feels good.  He still has some lower extremity edema and  extremity weakness, and comes into the clinic in a wheelchair today.  Otherwise he denies complaints.  His pain is well controlled on hydrocodone.  He asked about edible CBD and I told him that I had no objections, but cautioned him about the dosing.   His  appetite is better, and he has gained nearly 13 pounds since his last visit.  He states that his daughter has come up from [place] and making sure he is eating well.  He denies fever, chills or other signs of infection.  He denies nausea, vomiting, bowel issues, or abdominal pain.  He denies sore throat, cough, dyspnea, or chest pain.  REVIEW OF SYSTEMS:  Review of Systems  Constitutional: Negative.  Negative for appetite change, chills, fatigue, fever and unexpected weight change.  HENT:  Negative.   Eyes: Negative.   Respiratory: Negative.  Negative for chest tightness, cough, hemoptysis, shortness of breath and wheezing.   Cardiovascular: Positive for leg swelling. Negative for chest pain and palpitations.  Gastrointestinal: Negative.  Negative for abdominal distention, abdominal pain, blood in stool, constipation, diarrhea, nausea and vomiting.  Endocrine: Negative.   Genitourinary: Negative.  Negative for difficulty urinating, dysuria, frequency and hematuria.   Musculoskeletal: Negative for gait problem.  Skin: Negative.   Neurological: Positive for extremity weakness. Negative for dizziness, gait problem, headaches, light-headedness, numbness, seizures and speech difficulty.  Hematological: Negative.   Psychiatric/Behavioral: Negative.  Negative for depression and sleep disturbance. The patient is not nervous/anxious.   All other systems reviewed and are negative.    VITALS:  Blood pressure 140/65, pulse 99, temperature 97.6 F (36.4 C), temperature source Oral, resp. rate 18, height _0  (1.778 m), weight 238 lb 1.6 oz (108 kg), SpO2 96 %.  Wt Readings from Last 3 Encounters:  01/02/21 238 lb 1.6 oz (108 kg)  12/18/20 225 lb 9.6  oz (102.3 kg)  12/05/20 227 lb 12.8 oz (103.3 kg)    Body mass index is 34.16 kg/m.  Performance status (ECOG): 1 - Symptomatic but completely ambulatory  PHYSICAL EXAM:  Physical Exam Constitutional:      General: He is not in acute distress.    Appearance: Normal appearance. He is normal weight.  HENT:     Head: Normocephalic and atraumatic.  Eyes:     General: No scleral icterus.    Extraocular Movements: Extraocular movements intact.     Conjunctiva/sclera: Conjunctivae normal.     Pupils: Pupils are equal, round, and reactive to light.  Cardiovascular:     Rate and Rhythm: Normal rate and regular rhythm.     Pulses: Normal pulses.     Heart sounds: Normal heart sounds. No murmur heard. No friction rub. No gallop.   Pulmonary:     Effort: Pulmonary effort is normal. No respiratory distress.     Breath sounds: Normal breath sounds.  Abdominal:     General: Bowel sounds are normal. There is no distension.     Palpations: Abdomen is soft. There is no hepatomegaly, splenomegaly or mass.     Tenderness: There is no abdominal tenderness.  Musculoskeletal:        General: Normal  range of motion.     Cervical back: Normal range of motion and neck supple.     Right lower leg: 1+ Edema present.     Left lower leg: 1+ Edema present.  Lymphadenopathy:     Cervical: No cervical adenopathy.  Skin:    General: Skin is warm and dry.  Neurological:     General: No focal deficit present.     Mental Status: He is alert and oriented to person, place, and time. Mental status is at baseline.  Psychiatric:        Mood and Affect: Mood normal.        Behavior: Behavior normal.        Thought Content: Thought content normal.        Judgment: Judgment normal.     LABS:   CBC Latest Ref Rng & Units 12/18/2020 12/05/2020 12/02/2020  WBC - 5.9 7.5 7.3  Hemoglobin 13.5 - 17.5 11.3(A) 10.8(A) 9.2(L)  Hematocrit 41 - 53 37(A) 34(A) 29.9(L)  Platelets 150 - 399 330 282 232   CMP Latest Ref  Rng & Units 12/18/2020 12/05/2020 12/02/2020  Glucose 70 - 99 mg/dL - - 126(H)  BUN 4 - _0 Creatinine 0.6 - 1.3 1.0 1.1 0.93  Sodium 137 - 147 137 137 136  Potassium 3.4 - 5.3 4.8 5.6(A) 3.9  Chloride 99 - 108 109(A) 110(A) 109  CO2 13 - 22 21 23(A) 20(L)  Calcium 8.7 - 10.7 8.2(A) 8.8 7.6(L)  Total Protein 6.5 - 8.1 g/dL - - 5.0(L)  Total Bilirubin 0.3 - 1.2 mg/dL - - 0.4  Alkaline Phos 25 - 125 421(A) 375(A) 181(H)  AST 14 - 40 43(A) 39 26  ALT 10 - 40 _1 Lab Results  Component Value Date   CEA1 10.1 (H) 12/18/2020   /  CEA  Date Value Ref Range Status  12/18/2020 10.1 (H) 0.0 - 4.7 ng/mL Final    Comment:    (NOTE)                             Nonsmokers          <3.9                             Smokers             <5.6 Roche [employer] Electrochemiluminescence Immunoassay (ECLIA) Values obtained with different assay methods or kits cannot be used interchangeably.  Results cannot be interpreted as absolute evidence of the presence or absence of malignant disease. Performed At: [facility] [place], [place] [id number] Rush Farmer MD LO:[id number]     STUDIES:  No results found.   Allergies:  Allergies  Allergen Reactions  . Chlorhexidine     Port a cath not accessed     Current Medications: Current Outpatient Medications  Medication Sig Dispense Refill  . collagenase (SANTYL) ointment Apply to wound on heel daily    . mupirocin ointment (BACTROBAN) 2 % Apply to wound on heel daily    . allopurinol (ZYLOPRIM) 100 MG tablet Take 100 mg by mouth daily.    Marland Kitchen aspirin EC 81 MG tablet Take 81 mg by mouth daily.    Marland Kitchen atorvastatin (LIPITOR) 20 MG tablet Take 20 mg by mouth daily.    . Blood Glucose Monitoring Suppl (ONETOUCH VERIO  FLEX SYSTEM) w/Device KIT CHECK BLOOD SUGARS ONCE A DAY    . carvedilol (COREG) 12.5 MG tablet Take 12.5 mg by mouth 2 (two) times daily with a meal.    . cholecalciferol (VITAMIN D3) 25 MCG  (1000 UT) tablet Take 2,000 Units by mouth daily.    Marland Kitchen escitalopram (LEXAPRO) 10 MG tablet Take 10 mg by mouth daily.    . ferrous sulfate 325 (65 FE) MG tablet Take 325 mg by mouth daily with breakfast.    . gabapentin (NEURONTIN) 100 MG capsule Take 100 mg by mouth 2 (two) times daily.    Marland Kitchen HYDROcodone-acetaminophen (NORCO/VICODIN) 5-325 MG tablet Take by mouth.    . magic mouthwash w/lidocaine SOLN Take 5 mLs by mouth 4 (four) times daily as needed for mouth pain. 240 mL 0  . Magnesium Oxide 200 MG TABS Take by mouth.    . metFORMIN (GLUCOPHAGE) 500 MG tablet Take 1,000 mg by mouth 2 (two) times daily.    . mirtazapine (REMERON) 15 MG tablet TAKE 1 TABLET BY MOUTH EVERYDAY AT BEDTIME 30 tablet 2  . Multiple Vitamins-Minerals (CENTRUM SILVER 50+MEN PO) Take 1 tablet by mouth daily.    Marland Kitchen omeprazole (PRILOSEC) 40 MG capsule Take 40 mg by mouth daily.    . ondansetron (ZOFRAN) 4 MG tablet Take 1 tablet (4 mg total) by mouth every 4 (four) hours as needed for nausea. 90 tablet 3  . OneTouch Delica Lancets 40J MISC CHECK BLOOD SUGAR EVERY DAY AS DIRECTED.    Marland Kitchen ONETOUCH VERIO test strip daily.    . prochlorperazine (COMPAZINE) 10 MG tablet Take 1 tablet (10 mg total) by mouth every 6 (six) hours as needed for nausea or vomiting. 90 tablet 3  . rOPINIRole (REQUIP) 3 MG tablet Take 3 mg by mouth at bedtime.    . Semaglutide,0.25 or 0.5MG/DOS, (OZEMPIC, 0.25 OR 0.5 MG/DOSE,) 2 MG/1.5ML SOPN Inject 1 mg into the skin once a week. Friday    . Skin Protectants, Misc. (DIMETHICONE-ZINC OXIDE) cream     . SPS 15 GM/60ML suspension TAKE 60 MLS (15 G TOTAL) BY MOUTH DAILY FOR 5 DAYS.    Marland Kitchen vitamin B-12 (CYANOCOBALAMIN) 1000 MCG tablet Take 1,000 mcg by mouth daily.     No current facility-administered medications for this visit.     ASSESSMENT & PLAN:   Assessment:   1. 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  We do not advise surgical resection  or radiation.  MMR were normal. 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  He has regional and distal nodal metastatic disease.   He tolerated his last cycle of palliative FOLFOX/bevacizumab poorly due to fatigue, general weakness and decreased appetite.  Recent CT imaging in January 2022 from the emergency room revealed mixed response with a decrease in his cecal mass and stable liver metastasis, but an increase of the porta hepatis lymphadenopathy, as well as new small bilateral pleural effusions.  We do not feel he could tolerate further aggressive chemotherapy and have continued with supportive care.  He continues to improve since discontinuing chemotherapy.    2. Innumerable hypoattenuating masses throughout the liver consistent with metastatic disease, which appears fairly stable on most recent CT imaging.  3. Iron deficiency anemia for which he is on oral iron supplement three times daily.  I told them they could decrease the iron  to daily for now.  His hemoglobin remains stable.  4. Exophytic nodule arising from the right kidney measuring 2.7 cm.  This has mildly enlarged since prior study, but has been present for the past 3 years.  This measured 2.3 cm on recent CT.  5. Complicated pain from issues with his spine, for which he sees pain management.  He is currently on hydrocodone/APAP as needed.  6.  Numbness and paresthesias of his fingers, from prior spinal surgery.  This remains stable.   Plan: We have continued with supportive care and he is doing fairly well at this time.  He has even been working on his diet with good improvement in his weight.  We will plan to see him back in 3 weeks with CBC, CMP and magnesium level for continued supportive care. His port was last accessed on January 20th for IV fluids. The patient and his family understand the plans discussed today and are in agreement with  them.  They know to contact our office if he develops issues during immediate clinical assessment.   I provided 20 minutes of face-to-face time during this this encounter and > 50% was spent counseling as documented under my assessment and plan.    Derwood Kaplan, MD [facility] [address] [place] [address] Dept: [phone] Dept Fax: [phone]   I, Rita Ohara, am acting as scribe for Derwood Kaplan, MD  I have reviewed this report as typed by the medical scribe, and it is complete and accurate.  Hermina Barters

## 2021-01-02 ENCOUNTER — Other Ambulatory Visit: Payer: Self-pay | Admitting: Oncology

## 2021-01-02 ENCOUNTER — Telehealth: Payer: Self-pay | Admitting: Oncology

## 2021-01-02 ENCOUNTER — Other Ambulatory Visit: Payer: Self-pay | Admitting: Hematology and Oncology

## 2021-01-02 ENCOUNTER — Encounter: Payer: Self-pay | Admitting: Oncology

## 2021-01-02 ENCOUNTER — Other Ambulatory Visit: Payer: Self-pay

## 2021-01-02 ENCOUNTER — Inpatient Hospital Stay: Payer: Medicare HMO

## 2021-01-02 ENCOUNTER — Inpatient Hospital Stay (INDEPENDENT_AMBULATORY_CARE_PROVIDER_SITE_OTHER): Payer: Medicare HMO | Admitting: Oncology

## 2021-01-02 VITALS — BP 140/65 | HR 99 | Temp 97.6°F | Resp 18 | Ht 70.0 in | Wt 238.1 lb

## 2021-01-02 DIAGNOSIS — C180 Malignant neoplasm of cecum: Secondary | ICD-10-CM

## 2021-01-02 DIAGNOSIS — C787 Secondary malignant neoplasm of liver and intrahepatic bile duct: Secondary | ICD-10-CM | POA: Diagnosis not present

## 2021-01-02 DIAGNOSIS — G4700 Insomnia, unspecified: Secondary | ICD-10-CM

## 2021-01-02 LAB — HEPATIC FUNCTION PANEL
ALT: 18 (ref 10–40)
AST: 32 (ref 14–40)
Alkaline Phosphatase: 307 — AB (ref 25–125)
Bilirubin, Total: 0.6

## 2021-01-02 LAB — CBC AND DIFFERENTIAL
HCT: 35 — AB (ref 41–53)
Hemoglobin: 11.2 — AB (ref 13.5–17.5)
Neutrophils Absolute: 3.23
Platelets: 250 (ref 150–399)
WBC: 5.3

## 2021-01-02 LAB — BASIC METABOLIC PANEL
BUN: 16 (ref 4–21)
CO2: 23 — AB (ref 13–22)
Chloride: 107 (ref 99–108)
Creatinine: 0.8 (ref 0.6–1.3)
Glucose: 202
Potassium: 4.5 (ref 3.4–5.3)
Sodium: 138 (ref 137–147)

## 2021-01-02 LAB — COMPREHENSIVE METABOLIC PANEL
Albumin: 3.4 — AB (ref 3.5–5.0)
Calcium: 8.5 — AB (ref 8.7–10.7)

## 2021-01-02 LAB — MAGNESIUM: Magnesium: 1.3

## 2021-01-02 LAB — CBC: RBC: 3.7 — AB (ref 3.87–5.11)

## 2021-01-02 NOTE — Telephone Encounter (Signed)
Per 2/23 los next appt sched and given to patient 

## 2021-01-07 ENCOUNTER — Telehealth: Payer: Self-pay

## 2021-01-07 ENCOUNTER — Telehealth: Payer: Self-pay | Admitting: Oncology

## 2021-01-07 NOTE — Telephone Encounter (Signed)
-----   Message from Derwood Kaplan, MD sent at 01/06/2021 12:40 PM EST ----- Regarding: infusion appt Ralph Griffin, would you let pt/family know that he needs IV magnesium and have Pamala Hurry schedule when they can bring him in.  Also rec he increase the oral mag to twice daily if only taking 1

## 2021-01-07 NOTE — Addendum Note (Signed)
Addended by: Juanetta Beets on: 01/07/2021 10:31 AM   Modules accepted: Orders

## 2021-01-07 NOTE — Telephone Encounter (Signed)
Per Susie Cassette, patient scheduled for 3/1 Mag at 1:00 pm.  Patient aware of Appt

## 2021-01-07 NOTE — Telephone Encounter (Signed)
Patient notified. Patient states he can go get the infusion at anytime.Barbara notified and will call patient when scheduled.

## 2021-01-08 ENCOUNTER — Other Ambulatory Visit: Payer: Self-pay

## 2021-01-08 ENCOUNTER — Inpatient Hospital Stay: Payer: Medicare HMO | Attending: Oncology

## 2021-01-08 DIAGNOSIS — C180 Malignant neoplasm of cecum: Secondary | ICD-10-CM | POA: Diagnosis not present

## 2021-01-08 DIAGNOSIS — Z79899 Other long term (current) drug therapy: Secondary | ICD-10-CM | POA: Insufficient documentation

## 2021-01-08 MED ORDER — HEPARIN SOD (PORK) LOCK FLUSH 100 UNIT/ML IV SOLN
500.0000 [IU] | Freq: Once | INTRAVENOUS | Status: AC | PRN
  Administered 2021-01-08: 500 [IU]
  Filled 2021-01-08: qty 5

## 2021-01-08 MED ORDER — MAGNESIUM SULFATE 4 GM/100ML IV SOLN
4.0000 g | Freq: Once | INTRAVENOUS | Status: AC
  Administered 2021-01-08: 4 g via INTRAVENOUS

## 2021-01-08 MED ORDER — MAGNESIUM SULFATE 4 GM/100ML IV SOLN
INTRAVENOUS | Status: AC
  Filled 2021-01-08: qty 100

## 2021-01-08 MED ORDER — SODIUM CHLORIDE 0.9 % IV SOLN
Freq: Once | INTRAVENOUS | Status: AC
  Filled 2021-01-08: qty 250

## 2021-01-08 NOTE — Patient Instructions (Signed)
Hypomagnesemia Hypomagnesemia is a condition in which the level of magnesium in the blood is low. Magnesium is a mineral that is found in many foods. It is used in many different processes in the body. Hypomagnesemia can affect every organ in the body. In severe cases, it can cause life-threatening problems. What are the causes? This condition may be caused by:  Not getting enough magnesium in your diet.  Malnutrition.  Problems with absorbing magnesium from the intestines.  Dehydration.  Alcohol abuse.  Vomiting.  Severe or chronic diarrhea.  Some medicines, including medicines that make you urinate more (diuretics).  Certain diseases, such as kidney disease, diabetes, celiac disease, and overactive thyroid. What are the signs or symptoms? Symptoms of this condition include:  Loss of appetite.  Nausea and vomiting.  Involuntary shaking or trembling of a body part (tremor).  Muscle weakness.  Tingling in the arms and legs.  Sudden tightening of muscles (muscle spasms).  Confusion.  Psychiatric issues, such as depression, irritability, or psychosis.  A feeling of fluttering of the heart.  Seizures. These symptoms are more severe if magnesium levels drop suddenly. How is this diagnosed? This condition may be diagnosed based on:  Your symptoms and medical history.  A physical exam.  Blood and urine tests. How is this treated? Treatment depends on the cause and the severity of the condition. It may be treated with:  A magnesium supplement. This can be taken in pill form. If the condition is severe, magnesium is usually given through an IV.  Changes to your diet. You may be directed to eat foods that have a lot of magnesium, such as green leafy vegetables, peas, beans, and nuts.  Stopping any intake of alcohol.   Follow these instructions at home:  Make sure that your diet includes foods with magnesium. Foods that have a lot of magnesium in them  include: ? Green leafy vegetables, such as spinach and broccoli. ? Beans and peas. ? Nuts and seeds, such as almonds and sunflower seeds. ? Whole grains, such as whole grain bread and fortified cereals.  Take magnesium supplements if your health care provider tells you to do that. Take them as directed.  Take over-the-counter and prescription medicines only as told by your health care provider.  Have your magnesium levels monitored as told by your health care provider.  When you are active, drink fluids that contain electrolytes.  Avoid drinking alcohol.  Keep all follow-up visits as told by your health care provider. This is important.      Contact a health care provider if:  You get worse instead of better.  Your symptoms return. Get help right away if you:  Develop severe muscle weakness.  Have trouble breathing.  Feel that your heart is racing. Summary  Hypomagnesemia is a condition in which the level of magnesium in the blood is low.  Hypomagnesemia can affect every organ in the body.  Treatment may include eating more foods that contain magnesium, taking magnesium supplements, and not drinking alcohol.  Have your magnesium levels monitored as told by your health care provider. This information is not intended to replace advice given to you by your health care provider. Make sure you discuss any questions you have with your health care provider. Document Revised: 03/29/2020 Document Reviewed: 03/29/2020 Elsevier Patient Education  2021 [place]. Magnesium Sulfate injection What is this medicine? MAGNESIUM SULFATE (mag NEE zee um SUL fate) is an electrolyte injection commonly used to treat low magnesium levels in  your blood. It is also used to prevent or control seizures in women with preeclampsia or eclampsia. This medicine may be used for other purposes; ask your health care provider or pharmacist if you have questions. What should I tell my health care  provider before I take this medicine? They need to know if you have any of these conditions:  heart disease  history of irregular heart beat  kidney disease  an unusual or allergic reaction to magnesium sulfate, medicines, foods, dyes, or preservatives  pregnant or trying to get pregnant  breast-feeding How should I use this medicine? This medicine is for infusion into a vein. It is given by a health care professional in a hospital or clinic setting. Talk to your pediatrician regarding the use of this medicine in children. While this drug may be prescribed for selected conditions, precautions do apply. Overdosage: If you think you have taken too much of this medicine contact a poison control center or emergency room at once. NOTE: This medicine is only for you. Do not share this medicine with others. What if I miss a dose? This does not apply. What may interact with this medicine? This medicine may interact with the following medications:  certain medicines for anxiety or sleep  certain medicines for seizures like phenobarbital  digoxin  medicines that relax muscles for surgery  narcotic medicines for pain This list may not describe all possible interactions. Give your health care provider a list of all the medicines, herbs, non-prescription drugs, or dietary supplements you use. Also tell them if you smoke, drink alcohol, or use illegal drugs. Some items may interact with your medicine. What should I watch for while using this medicine? Your condition will be monitored carefully while you are receiving this medicine. You may need blood work done while you are receiving this medicine. What side effects may I notice from receiving this medicine? Side effects that you should report to your doctor or health care professional as soon as possible:  allergic reactions like skin rash, itching or hives, swelling of the face, lips, or tongue  facial flushing  muscle  weakness  signs and symptoms of low blood pressure like dizziness; feeling faint or lightheaded, falls; unusually weak or tired  signs and symptoms of a dangerous change in heartbeat or heart rhythm like chest pain; dizziness; fast or irregular heartbeat; palpitations; breathing problems  sweating This list may not describe all possible side effects. Call your doctor for medical advice about side effects. You may report side effects to FDA at 1-800-FDA-1088. Where should I keep my medicine? This drug is given in a hospital or clinic and will not be stored at home. NOTE: This sheet is a summary. It may not cover all possible information. If you have questions about this medicine, talk to your doctor, pharmacist, or health care provider.  2021 Elsevier/Gold Standard (2016-05-14 12:31:42)

## 2021-01-22 NOTE — Progress Notes (Signed)
[place]  [address] [place],  [place]  [address] [phone]  Clinic Day:  01/23/2021  Referring physician: Raina Mina., MD  This document serves as a record of services personally performed by Hosie Poisson, MD. It was created on their behalf by [facility] E, a trained medical scribe. The creation of this record is based on the scribe's personal observations and the provider's statements to them.  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 is a 80 y.o. 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  He was referred from the emergency department for the evaluation and treatment of adenocarcinoma of the cecum.  He presented to the [facility] emergency department on September 24th due to abdominal discomfort, melena, hematochezia, anorexia and weight loss.  He had been started on apixaban the week prior by his primary care physician for atrial fibrillation.  However, in 3-4 days the patient started having black stool and bright red blood per rectum.  This medication was held as advised by his primary physician and he was advised to present to the ER.  While in the ER he was started on pantoprazole drip due to concern for GI bleed and GI services was consulted.  CTA imaging revealed findings most consistent with metastatic colorectal carcinoma with innumerable hypoattenuating masses throughout the liver, the largest measuring 4.8 cm, and both regional and distal nodal metastatic disease.  There was also a growing exophytic nodule arising from the right kidney measuring 2.7 cm, previously 2.2 cm, concerning for renal cell carcinoma.  There was no evidence of active arterial GI bleeding.  He underwent EGD and total  colonoscopy with biopsy on September 25th, which revealed several large nonobstructing malignant appearing masses in the cecum.  Surgical pathology from this procedure confirmed adenocarcinoma with necrosis.  MMR was normal.  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 Iron studies were notable for iron deficiency with an iron level of 12.0 with a TIBC of 242 for a percent saturation of 4.9%.  The patient was given IV Feraheme and started on oral supplement 325 mg BID. His iron supplement was subsequently increased to TID. He was discharged on September 26th, and lab work at that time was unremarkable except for a hemoglobin of 8.3, and a calcium of 8.2, and an elevated alkaline phosphatase.  He had his first cycle of FOLFOX/bevacizumab on October 13th and tolerated this fairly well.  As he has continued to lose weight, he was placed on mirtazapine 7.5 mg at bedtime to improve his appetite, this was subsequently increased to 15 mg at bedtime.  The CEA has steadily decreasedfrom 57.2 down to 25.2 in November and 12.9 in December.   CT abdomen and pelvis on January 8th revealed a mixed response with an increase in the porta hepatis lymph nodes from previous and new small bilateral pleural effusions right greater than left.  The liver metastasis was fairly stable.  There was a decrease in the cecal mass from previous. The exophytic right kidney nodule measured approximately 2.3 cm, which was stable to improved.  A chest x-ray revealed a new mild opacity in the medial right lung base felt to be atelectasis versus infiltrate.  He was hospitalized for profound hypoglycemia with a blood sugar of 21 in January.  He had a high potassium in January and Dr. Bea Graff ordered sorbitol.    INTERVAL HISTORY:  He is here for continued supportive care.  He states that he is doing very well, and he has been released from physical therapy.  He will  see occupational therapy tomorrow.  He denies complaints today.  His hemoglobin has mildly decreased from 11.2 to 10.5, and his white count and platelets are normal.  He continues oral iron daily, and I advised that he increase this to twice daily.  Chemistries are remarkable for a magnesium of 1.4, improved, an albumin of 3.4, stable and a blood glucose of 276.  He did receive IV magnesium on March 1st and continues oral supplement 200 mg daily.  I advised that he increase this to twice daily as well.  His  appetite is good, and he has gained 7 pounds since his last visit.  He denies fever, chills or other signs of infection.  He denies nausea, vomiting, bowel issues, or abdominal pain.  He denies sore throat, cough, dyspnea, or chest pain.  REVIEW OF SYSTEMS:  Review of Systems  Constitutional: Negative.  Negative for appetite change, chills, fatigue, fever and unexpected weight change.  HENT:  Negative.   Eyes: Negative.   Respiratory: Negative.  Negative for chest tightness, cough, hemoptysis, shortness of breath and wheezing.   Cardiovascular: Negative.  Negative for chest pain, leg swelling and palpitations.  Gastrointestinal: Negative.  Negative for abdominal distention, abdominal pain, blood in stool, constipation, diarrhea, nausea and vomiting.  Endocrine: Negative.   Genitourinary: Negative.  Negative for difficulty urinating, dysuria, frequency and hematuria.   Musculoskeletal: Negative for arthralgias, back pain, flank pain, gait problem and myalgias.  Skin: Negative.   Neurological: Positive for extremity weakness (in a wheelchair) and numbness (of the fingers from prior spinal surgery). Negative for dizziness, gait problem, headaches, light-headedness, seizures and speech difficulty.  Hematological: Negative.   Psychiatric/Behavioral: Negative.  Negative for depression and sleep disturbance. The patient is not nervous/anxious.   All other systems reviewed and are negative.     VITALS:  Blood pressure (!) 143/73, pulse 83, temperature 98.1 F (36.7 C), temperature source Oral, resp. rate 18, height 5' 10"  (1.778 m), weight 244 lb (110.7 kg), SpO2 97 %.  Wt Readings from Last 3 Encounters:  01/23/21 244 lb (110.7 kg)  01/08/21 240 lb (108.9 kg)  01/02/21 238 lb 1.6 oz (108 kg)    Body mass index is 35.01 kg/m.  Performance status (ECOG): 1 - Symptomatic but completely ambulatory  PHYSICAL EXAM:  Physical Exam Constitutional:      General: He is not in acute distress.    Appearance: Normal appearance. He is normal weight.  HENT:     Head: Normocephalic and atraumatic.  Eyes:     General: No scleral icterus.    Extraocular Movements: Extraocular movements intact.     Conjunctiva/sclera: Conjunctivae normal.     Pupils: Pupils are equal, round, and reactive to light.  Cardiovascular:     Rate and Rhythm: Normal rate. Rhythm irregular.     Pulses: Normal pulses.     Heart sounds: Normal heart sounds. No murmur heard. No friction rub. No gallop.   Pulmonary:     Effort: Pulmonary effort is normal. No respiratory distress.     Breath sounds: Normal breath sounds.  Abdominal:     General: Bowel sounds are normal. There  is no distension.     Palpations: Abdomen is soft. There is hepatomegaly (just below the right costal margin). There is no splenomegaly or mass.     Tenderness: There is no abdominal tenderness.  Musculoskeletal:        General: Normal range of motion.     Cervical back: Normal range of motion and neck supple.     Right lower leg: No edema.     Left lower leg: No edema.  Lymphadenopathy:     Cervical: No cervical adenopathy.  Skin:    General: Skin is warm and dry.  Neurological:     General: No focal deficit present.     Mental Status: He is alert and oriented to person, place, and time. Mental status is at baseline.  Psychiatric:        Mood and Affect: Mood normal.        Behavior: Behavior normal.        Thought Content:  Thought content normal.        Judgment: Judgment normal.     LABS:   CBC Latest Ref Rng & Units 01/02/2021 12/18/2020 12/05/2020  WBC - 5.3 5.9 7.5  Hemoglobin 13.5 - 17.5 11.2(A) 11.3(A) 10.8(A)  Hematocrit 41 - 53 35(A) 37(A) 34(A)  Platelets 150 - 399 250 330 282   CMP Latest Ref Rng & Units 01/02/2021 12/18/2020 12/05/2020  Glucose 70 - 99 mg/dL - - -  BUN 4 - 21 16 18 20   Creatinine 0.6 - 1.3 0.8 1.0 1.1  Sodium 137 - 147 138 137 137  Potassium 3.4 - 5.3 4.5 4.8 5.6(A)  Chloride 99 - 108 107 109(A) 110(A)  CO2 13 - 22 23(A) 21 23(A)  Calcium 8.7 - 10.7 8.5(A) 8.2(A) 8.8  Total Protein 6.5 - 8.1 g/dL - - -  Total Bilirubin 0.3 - 1.2 mg/dL - - -  Alkaline Phos 25 - 125 307(A) 421(A) 375(A)  AST 14 - 40 32 43(A) 39  ALT 10 - 40 18 22 22      Lab Results  Component Value Date   CEA1 10.1 (H) 12/18/2020   /  CEA  Date Value Ref Range Status  12/18/2020 10.1 (H) 0.0 - 4.7 ng/mL Final    Comment:    (NOTE)                             Nonsmokers          <3.9                             Smokers             <5.6 [employer] Electrochemiluminescence Immunoassay (ECLIA) Values obtained with different assay methods or kits cannot be used interchangeably.  Results cannot be interpreted as absolute evidence of the presence or absence of malignant disease. Performed At: [facility] [place], [place] [id number] Rush Farmer MD LD:[id number]     STUDIES:  No results found.   Allergies:  Allergies  Allergen Reactions  . Chlorhexidine     Port a cath not accessed     Current Medications: Current Outpatient Medications  Medication Sig Dispense Refill  . allopurinol (ZYLOPRIM) 100 MG tablet Take 100 mg by mouth daily.    Marland Kitchen aspirin EC 81 MG tablet Take 81 mg by mouth daily.    Marland Kitchen atorvastatin (LIPITOR) 20 MG  tablet Take 20 mg by mouth daily.    . Blood Glucose Monitoring Suppl (ONETOUCH VERIO FLEX SYSTEM) w/Device KIT CHECK BLOOD SUGARS ONCE A  DAY    . carvedilol (COREG) 12.5 MG tablet Take 12.5 mg by mouth 2 (two) times daily with a meal.    . cholecalciferol (VITAMIN D3) 25 MCG (1000 UT) tablet Take 2,000 Units by mouth daily.    Marland Kitchen escitalopram (LEXAPRO) 10 MG tablet Take 10 mg by mouth daily.    . ferrous sulfate 325 (65 FE) MG tablet Take 325 mg by mouth daily with breakfast.    . gabapentin (NEURONTIN) 100 MG capsule Take 100 mg by mouth 2 (two) times daily.    Marland Kitchen HYDROcodone-acetaminophen (NORCO) 7.5-325 MG tablet Take 1 tablet by mouth 3 (three) times daily as needed.    Marland Kitchen levothyroxine (LEVOXYL) 25 MCG tablet Take 25 mcg by mouth daily before breakfast.    . magic mouthwash w/lidocaine SOLN Take 5 mLs by mouth 4 (four) times daily as needed for mouth pain. 240 mL 0  . Magnesium Oxide 200 MG TABS Take by mouth.    . metFORMIN (GLUCOPHAGE) 500 MG tablet Take 1,000 mg by mouth 2 (two) times daily.    . mirtazapine (REMERON) 15 MG tablet TAKE 1 TABLET BY MOUTH EVERYDAY AT BEDTIME 30 tablet 2  . Multiple Vitamins-Minerals (CENTRUM SILVER 50+MEN PO) Take 1 tablet by mouth daily.    Marland Kitchen omeprazole (PRILOSEC) 40 MG capsule Take 40 mg by mouth daily.    . ondansetron (ZOFRAN) 4 MG tablet Take 1 tablet (4 mg total) by mouth every 4 (four) hours as needed for nausea. 90 tablet 3  . OneTouch Delica Lancets 34V MISC CHECK BLOOD SUGAR EVERY DAY AS DIRECTED.    Marland Kitchen ONETOUCH VERIO test strip daily.    Marland Kitchen OZEMPIC, 1 MG/DOSE, 4 MG/3ML SOPN Inject 1 mg into the skin once a week.    . prochlorperazine (COMPAZINE) 10 MG tablet Take 1 tablet (10 mg total) by mouth every 6 (six) hours as needed for nausea or vomiting. 90 tablet 3  . rOPINIRole (REQUIP) 3 MG tablet Take 3 mg by mouth at bedtime.    . Skin Protectants, Misc. (DIMETHICONE-ZINC OXIDE) cream     . SPS 15 GM/60ML suspension TAKE 60 MLS (15 G TOTAL) BY MOUTH DAILY FOR 5 DAYS.    Marland Kitchen vitamin B-12 (CYANOCOBALAMIN) 1000 MCG tablet Take 1,000 mcg by mouth daily.     No current facility-administered  medications for this visit.     ASSESSMENT & PLAN:   Assessment:   1. 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  We do not advise surgical resection or radiation.  MMR were normal. 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  He has regional and distal nodal metastatic disease.   He tolerated his last cycle of palliative FOLFOX/bevacizumab poorly due to fatigue, general weakness and decreased appetite.  Recent CT imaging in January 2022 from the emergency room revealed mixed response with a decrease in his cecal mass and stable liver metastasis, but an increase of the porta hepatis lymphadenopathy, as well as new small bilateral pleural effusions.  We do not feel he could tolerate further aggressive chemotherapy and have continued with supportive care.  He continues to improve since discontinuing chemotherapy.    2. Innumerable hypoattenuating masses throughout the liver consistent with  metastatic disease, which appears fairly stable on most recent CT imaging.  3. Iron deficiency anemia for which he is on oral iron supplement once daily.  His hemoglobin has worsened, and so I advised that they increase this to twice daily.  4. Exophytic nodule arising from the right kidney measuring 2.7 cm.  This has mildly enlarged since prior study, but has been present for the past 3 years.  This measured 2.3 cm on recent CT.  5. 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  He is currently on hydrocodone/APAP as needed.  6.  Numbness and paresthesias of his fingers, from prior spinal surgery.  This remains stable.   7.  Hypomagnesemia.  He received IV magnesium last month and it is somewhat improved so we will try to just increase his oral magnesium to 400 mg twice daily.  Plan: We have continued with supportive care and  he is doing very well at this time and has been released from physical therapy.  He has even been working on his diet with good improvement in his weight.  He knows to increase his oral iron supplement to twice daily and oral magnesium to twice daily.  We discussed the fact that his quality of life if much better than if we had pursued further chemotherapy.  We will plan to see him back in 6 weeks with CBC, CMP, CEA and magnesium level for continued supportive care. His port was last accessed on January 20th for IV fluids. The patient and his family understand the plans discussed today and are in agreement with them.  They know to contact our office if he develops issues during immediate clinical assessment.   I provided 20 minutes of face-to-face time during this this encounter and > 50% was spent counseling as documented under my assessment and plan.    Derwood Kaplan, MD [facility] [address] [place] [address] Dept: [phone] Dept Fax: [phone]   I, Rita Ohara, am acting as scribe for Derwood Kaplan, MD  I have reviewed this report as typed by the medical scribe, and it is complete and accurate.  Hermina Barters

## 2021-01-23 ENCOUNTER — Other Ambulatory Visit: Payer: Self-pay | Admitting: Hematology and Oncology

## 2021-01-23 ENCOUNTER — Other Ambulatory Visit: Payer: Self-pay | Admitting: Oncology

## 2021-01-23 ENCOUNTER — Inpatient Hospital Stay (INDEPENDENT_AMBULATORY_CARE_PROVIDER_SITE_OTHER): Payer: Medicare HMO | Admitting: Oncology

## 2021-01-23 ENCOUNTER — Telehealth: Payer: Self-pay | Admitting: Oncology

## 2021-01-23 ENCOUNTER — Encounter: Payer: Self-pay | Admitting: Oncology

## 2021-01-23 ENCOUNTER — Inpatient Hospital Stay: Payer: Medicare HMO

## 2021-01-23 VITALS — BP 143/73 | HR 83 | Temp 98.1°F | Resp 18 | Ht 70.0 in | Wt 244.0 lb

## 2021-01-23 DIAGNOSIS — C180 Malignant neoplasm of cecum: Secondary | ICD-10-CM

## 2021-01-23 DIAGNOSIS — C20 Malignant neoplasm of rectum: Secondary | ICD-10-CM

## 2021-01-23 LAB — COMPREHENSIVE METABOLIC PANEL
Albumin: 3.4 — AB (ref 3.5–5.0)
Calcium: 8.6 — AB (ref 8.7–10.7)

## 2021-01-23 LAB — BASIC METABOLIC PANEL
BUN: 19 (ref 4–21)
CO2: 27 — AB (ref 13–22)
Chloride: 101 (ref 99–108)
Creatinine: 0.8 (ref 0.6–1.3)
Glucose: 276
Potassium: 4.1 (ref 3.4–5.3)
Sodium: 135 — AB (ref 137–147)

## 2021-01-23 LAB — HEPATIC FUNCTION PANEL
ALT: 24 (ref 10–40)
AST: 41 — AB (ref 14–40)
Alkaline Phosphatase: 413 — AB (ref 25–125)

## 2021-01-23 LAB — CBC AND DIFFERENTIAL
HCT: 31 — AB (ref 41–53)
Hemoglobin: 10.5 — AB (ref 13.5–17.5)
Neutrophils Absolute: 4.03
Platelets: 289 (ref 150–399)
WBC: 6.1

## 2021-01-23 LAB — CORRECTED CALCIUM (CC13): Calcium, Corrected: 9.2

## 2021-01-23 LAB — CBC
MCV: 93 (ref 80–94)
RBC: 3.38 — AB (ref 3.87–5.11)

## 2021-01-23 LAB — MAGNESIUM: Magnesium: 1.4 — AB (ref 1.6–2.3)

## 2021-01-23 NOTE — Telephone Encounter (Signed)
Per 3/16 los next appt scheduled and given to patient 

## 2021-01-25 ENCOUNTER — Other Ambulatory Visit: Payer: Self-pay

## 2021-01-25 ENCOUNTER — Telehealth: Payer: Self-pay

## 2021-01-25 NOTE — Telephone Encounter (Signed)
-----   Message from Derwood Kaplan, MD sent at 01/24/2021  7:55 PM EDT ----- Regarding: med list I increased his iron to bid and his magnesium to bid

## 2021-01-25 NOTE — Telephone Encounter (Signed)
Patient notified and medication list updated.

## 2021-01-28 ENCOUNTER — Telehealth: Payer: Self-pay

## 2021-01-28 NOTE — Telephone Encounter (Signed)
Dr Hinton Rao agreed that pt is Hospice appropriate & remain attending. Notified Ebony Hail, intake @ Hospice.     Pt was discharged from home health and referred to Hospice. Ebony Hail is calling to ask if pt is Hospice appropriate and if she would like to remain attending. [phone].

## 2021-03-01 ENCOUNTER — Other Ambulatory Visit: Payer: Self-pay

## 2021-03-01 DIAGNOSIS — M549 Dorsalgia, unspecified: Secondary | ICD-10-CM

## 2021-03-01 DIAGNOSIS — C189 Malignant neoplasm of colon, unspecified: Secondary | ICD-10-CM

## 2021-03-01 MED ORDER — HYDROCODONE-ACETAMINOPHEN 7.5-325 MG PO TABS: 1.0000 | ORAL_TABLET | Freq: Three times a day (TID) | ORAL | 0 refills | Status: DC | PRN

## 2021-03-06 ENCOUNTER — Telehealth: Payer: Self-pay | Admitting: Hematology and Oncology

## 2021-03-06 ENCOUNTER — Inpatient Hospital Stay: Attending: Oncology | Admitting: Hematology and Oncology

## 2021-03-06 ENCOUNTER — Encounter: Payer: Self-pay | Admitting: Hematology and Oncology

## 2021-03-06 ENCOUNTER — Inpatient Hospital Stay

## 2021-03-06 DIAGNOSIS — R202 Paresthesia of skin: Secondary | ICD-10-CM | POA: Diagnosis not present

## 2021-03-06 DIAGNOSIS — M129 Arthropathy, unspecified: Secondary | ICD-10-CM | POA: Insufficient documentation

## 2021-03-06 DIAGNOSIS — G4700 Insomnia, unspecified: Secondary | ICD-10-CM | POA: Diagnosis not present

## 2021-03-06 DIAGNOSIS — Z87891 Personal history of nicotine dependence: Secondary | ICD-10-CM | POA: Insufficient documentation

## 2021-03-06 DIAGNOSIS — I10 Essential (primary) hypertension: Secondary | ICD-10-CM | POA: Insufficient documentation

## 2021-03-06 DIAGNOSIS — Z800 Family history of malignant neoplasm of digestive organs: Secondary | ICD-10-CM | POA: Diagnosis not present

## 2021-03-06 DIAGNOSIS — E785 Hyperlipidemia, unspecified: Secondary | ICD-10-CM | POA: Insufficient documentation

## 2021-03-06 DIAGNOSIS — Z7984 Long term (current) use of oral hypoglycemic drugs: Secondary | ICD-10-CM | POA: Insufficient documentation

## 2021-03-06 DIAGNOSIS — J90 Pleural effusion, not elsewhere classified: Secondary | ICD-10-CM | POA: Diagnosis not present

## 2021-03-06 DIAGNOSIS — C180 Malignant neoplasm of cecum: Secondary | ICD-10-CM

## 2021-03-06 DIAGNOSIS — D509 Iron deficiency anemia, unspecified: Secondary | ICD-10-CM | POA: Insufficient documentation

## 2021-03-06 DIAGNOSIS — C787 Secondary malignant neoplasm of liver and intrahepatic bile duct: Secondary | ICD-10-CM | POA: Diagnosis not present

## 2021-03-06 DIAGNOSIS — Z7982 Long term (current) use of aspirin: Secondary | ICD-10-CM | POA: Diagnosis not present

## 2021-03-06 DIAGNOSIS — I4891 Unspecified atrial fibrillation: Secondary | ICD-10-CM | POA: Diagnosis not present

## 2021-03-06 DIAGNOSIS — R200 Anesthesia of skin: Secondary | ICD-10-CM | POA: Diagnosis not present

## 2021-03-06 DIAGNOSIS — E119 Type 2 diabetes mellitus without complications: Secondary | ICD-10-CM | POA: Diagnosis not present

## 2021-03-06 DIAGNOSIS — F32A Depression, unspecified: Secondary | ICD-10-CM | POA: Insufficient documentation

## 2021-03-06 DIAGNOSIS — G8929 Other chronic pain: Secondary | ICD-10-CM | POA: Diagnosis not present

## 2021-03-06 LAB — HEPATIC FUNCTION PANEL
ALT: 26 (ref 10–40)
AST: 42 — AB (ref 14–40)
Alkaline Phosphatase: 673 — AB (ref 25–125)
Bilirubin, Total: 0.8

## 2021-03-06 LAB — CBC
MCV: 92 (ref 80–94)
RBC: 3.55 — AB (ref 3.87–5.11)

## 2021-03-06 LAB — BASIC METABOLIC PANEL
BUN: 24 — AB (ref 4–21)
CO2: 26 — AB (ref 13–22)
Chloride: 102 (ref 99–108)
Creatinine: 0.9 (ref 0.6–1.3)
Glucose: 212
Potassium: 4.3 (ref 3.4–5.3)
Sodium: 133 — AB (ref 137–147)

## 2021-03-06 LAB — CBC AND DIFFERENTIAL
HCT: 33 — AB (ref 41–53)
Hemoglobin: 10.5 — AB (ref 13.5–17.5)
Neutrophils Absolute: 3.9
Platelets: 313 (ref 150–399)
WBC: 6.1

## 2021-03-06 LAB — COMPREHENSIVE METABOLIC PANEL
Albumin: 3.6 (ref 3.5–5.0)
Calcium: 8.9 (ref 8.7–10.7)

## 2021-03-06 LAB — MAGNESIUM: Magnesium: 1.6 (ref 1.6–2.3)

## 2021-03-06 MED ORDER — MIRTAZAPINE 15 MG PO TABS: 15.0000 mg | ORAL_TABLET | Freq: Every day | ORAL | 2 refills | Status: DC

## 2021-03-06 NOTE — Telephone Encounter (Signed)
Per 4/27 LOS, patient scheduled for June Appt's.  Gave patient Appt Summary

## 2021-03-06 NOTE — Progress Notes (Signed)
[facility]  [address] [place],  [place]  [address] [phone]  Clinic Day:  03/06/2021  Referring physician: Raina Mina., MD   CHIEF COMPLAINT:  CC:  Metastatic colon cancer to liver  Current Treatment:   Supportive care   HISTORY OF PRESENT ILLNESS:  Ralph Devaul Sr. is a 80 y.o. 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 He presented to the [facility] emergency department in September due to abdominal discomfort, melena, hematochezia, anorexia and weight loss. He had been started on apixaban the week prior by his primary care physician for atrial fibrillation. However, in 3-4 days the patient started having black stool and bright red blood per rectum. This medication was held as advised by his primary physician and he was advised to present to the ER.While in the ER he was started on pantoprazole drip due to concern for GI bleed and GI services was consulted. CTA imaging revealed findings most consistent with metastatic colorectal carcinoma with innumerable hypoattenuating masses throughout the liver, the largest measuring 4.8 cm, and both regional and distal nodal metastatic disease. There was also a growing exophytic nodule arising from the right kidney measuring 2.7 cm, previously 2.2 cm, concerning for renal cell carcinoma. There was no evidence of active arterial GI bleeding. He underwent EGD and total colonoscopy with biopsy on September 25th, which revealed several large nonobstructing malignant appearing masses in the cecum. Surgical pathology from this procedure confirmed adenocarcinoma with necrosis. MMR was normal. 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 Iron studies were notable for iron deficiency with an iron level of 12.0 with a TIBC of 242 for  a percent saturation of 4.9%. The patient was given IV Feraheme and started on oral supplement 325 mg BID. His iron supplement was subsequently increased to TID.   He received his first cycle of palliative FOLFOX/bevacizumab in October. As he has continued to lose weight, he was placed on mirtazapine 7.5 mg at bedtime to improve his appetite, this was subsequently increased to 15 mg at bedtime. The CEA has steadily decreasedfrom 57.2 down to 25.2 in November and 12.9 in December.   CT abdomen and pelvis  in January revealed a mixed response with an increase in the porta hepatis lymph nodes from previous and new small bilateral pleural effusions right greater than left. The liver metastasis was fairly stable. There was a decrease in the cecal mass from previous. The exophytic right kidney nodule measured approximately 2.3 cm, which was stable to slightly improved. A chest x-ray revealed a new mild opacity in the medial right lung base felt to be atelectasis versus infiltrate. He was hospitalized for profound hypoglycemia with a blood sugar of 21 in January. He was found to have hyperkalemia in January and Dr. Bea Graff ordered sorbitol.   As he tolerated his last cycle of chemotherapy in December very poorly and he was only having a mixed response, we did not feel he could tolerate further aggressive therapy and recommended hospice care.  He had been doing better since discontinuation of chemotherapy.  INTERVAL HISTORY:  Ralph Griffin is here today for repeat clinical assessment and states he has been more depressed. He stopped escitalopram due to the cost.  He does not believe he is taking mirtazapine either.  He is not sure why he stopped that. He denies  fevers or chills. He denies pain. His appetite is good. His weight has decreased 20 pounds over last 6 weeks.  REVIEW OF SYSTEMS:  Review of Systems  Constitutional: Negative for appetite change, chills, fatigue, fever and unexpected weight change.   HENT:   Negative for lump/mass, mouth sores and sore throat.   Respiratory: Negative for cough and shortness of breath.   Cardiovascular: Negative for chest pain and leg swelling.  Gastrointestinal: Positive for abdominal pain (right upper quadrant around to back). Negative for constipation, diarrhea, nausea and vomiting.  Genitourinary: Negative for difficulty urinating, dysuria, frequency and hematuria.   Musculoskeletal: Negative for arthralgias, back pain, flank pain and myalgias.  Skin: Negative for itching, rash and wound.  Neurological: Negative for dizziness, extremity weakness, headaches, light-headedness and numbness.  Hematological: Negative for adenopathy.  Psychiatric/Behavioral: Positive for depression. Negative for sleep disturbance. The patient is not nervous/anxious.     VITALS:  Blood pressure 133/65, pulse 77, temperature 97.9 F (36.6 C), temperature source Oral, resp. rate 18, height $RemoveBe'5\' 10"'chXEOIyah$  (1.778 m), weight 225 lb 11.2 oz (102.4 kg), SpO2 97 %.  Wt Readings from Last 3 Encounters:  03/06/21 225 lb 11.2 oz (102.4 kg)  01/23/21 244 lb (110.7 kg)  01/08/21 240 lb (108.9 kg)    Body mass index is 32.38 kg/m.  Performance status (ECOG): 2 - Symptomatic, <50% confined to bed  PHYSICAL EXAM:  Physical Exam Vitals and nursing note reviewed.  Constitutional:      General: He is not in acute distress.    Appearance: Normal appearance. He is normal weight.  HENT:     Head: Normocephalic and atraumatic.     Mouth/Throat:     Mouth: Mucous membranes are moist.     Pharynx: Oropharynx is clear. No oropharyngeal exudate or posterior oropharyngeal erythema.  Eyes:     General: No scleral icterus.    Extraocular Movements: Extraocular movements intact.     Conjunctiva/sclera: Conjunctivae normal.     Pupils: Pupils are equal, round, and reactive to light.  Cardiovascular:     Rate and Rhythm: Normal rate and regular rhythm.     Heart sounds: Normal heart sounds. No  murmur heard. No friction rub. No gallop.   Pulmonary:     Effort: Pulmonary effort is normal.     Breath sounds: Normal breath sounds. No wheezing, rhonchi or rales.  Chest:  Breasts:     Right: No axillary adenopathy or supraclavicular adenopathy.     Left: No axillary adenopathy or supraclavicular adenopathy.    Abdominal:     General: Bowel sounds are normal. There is no distension.     Palpations: Abdomen is soft. There is hepatomegaly (tender hepatomegaly, liver palpable about 8cm below right costal margin laterally). There is no splenomegaly or mass.     Tenderness: There is no abdominal tenderness.  Musculoskeletal:        General: Normal range of motion.     Cervical back: Normal range of motion and neck supple. No tenderness.     Right lower leg: No edema.     Left lower leg: No edema.  Lymphadenopathy:     Cervical: No cervical adenopathy.     Upper Body:     Right upper body: No supraclavicular or axillary adenopathy.     Left upper body: No supraclavicular or axillary adenopathy.     Lower Body: No right inguinal adenopathy. No left inguinal adenopathy.  Skin:    General: Skin is warm and dry.  Coloration: Skin is not jaundiced.     Findings: No rash.  Neurological:     Mental Status: He is alert and oriented to person, place, and time.     Cranial Nerves: No cranial nerve deficit.  Psychiatric:        Attention and Perception: Attention normal.        Mood and Affect: Mood is depressed.        Behavior: Behavior normal.        Thought Content: Thought content normal.    LABS:   CBC Latest Ref Rng & Units 03/06/2021 01/23/2021 01/02/2021  WBC - 6.1 6.1 5.3  Hemoglobin 13.5 - 17.5 10.5(A) 10.5(A) 11.2(A)  Hematocrit 41 - 53 33(A) 31(A) 35(A)  Platelets 150 - 399 313 289 250   CMP Latest Ref Rng & Units 03/06/2021 01/23/2021 01/02/2021  Glucose 70 - 99 mg/dL - - -  BUN 4 - 21 24(A) 19 16  Creatinine 0.6 - 1.3 0.9 0.8 0.8  Sodium 137 - 147 133(A) 135(A) 138   Potassium 3.4 - 5.3 4.3 4.1 4.5  Chloride 99 - 108 102 101 107  CO2 13 - 22 26(A) 27(A) 23(A)  Calcium 8.7 - 10.7 8.9 8.6(A) 8.5(A)  Total Protein 6.5 - 8.1 g/dL - - -  Total Bilirubin 0.3 - 1.2 mg/dL - - -  Alkaline Phos 25 - 125 673(A) 413(A) 307(A)  AST 14 - 40 42(A) 41(A) 32  ALT 10 - 40 _0 Lab Results  Component Value Date   CEA1 10.1 (H) 12/18/2020   /  CEA  Date Value Ref Range Status  12/18/2020 10.1 (H) 0.0 - 4.7 ng/mL Final    Comment:    (NOTE)                             Nonsmokers          <3.9                             Smokers             <5.6 [employer] Electrochemiluminescence Immunoassay (ECLIA) Values obtained with different assay methods or kits cannot be used interchangeably.  Results cannot be interpreted as absolute evidence of the presence or absence of malignant disease. Performed At: [facility] [place], [place] [id number] Rush Farmer MD [id number]    No results found for: PSA1 No results found for: CAN199 No results found for: CAN125  No results found for: TOTALPROTELP, ALBUMINELP, A1GS, A2GS, BETS, BETA2SER, GAMS, MSPIKE, SPEI No results found for: TIBC, FERRITIN, IRONPCTSAT No results found for: LDH  STUDIES:  No results found.    HISTORY:   Past Medical History:  Diagnosis Date  . Arthritis   . Atrial fibrillation ([place])   . Diabetes mellitus without complication ([place])   . History of gout   . Hyperlipidemia   . Hypertension     Past Surgical History:  Procedure Laterality Date  . CERVICAL DISCECTOMY    . CHOLECYSTECTOMY    . POSTERIOR CERVICAL LAMINECTOMY N/A 12/27/2018   Procedure: Decompressive Cervical Laminectomy and Posterior Cervical Fusion Cervical three-four, four-five, five-six;  Surgeon: Kary Kos, MD;  Location: [facility];  Service: Neurosurgery;  Laterality: N/A;  . TONSILLECTOMY AND ADENOIDECTOMY      Family History  Problem Relation Age of Onset  .  Pancreatic  cancer Mother   . Colon cancer Niece     Social History:  reports that he has quit smoking. He quit after 14.00 years of use. He has never used smokeless tobacco. He reports previous alcohol use. He reports that he does not use drugs.The patient is accompanied by his daughter today.  Allergies:  Allergies  Allergen Reactions  . Chlorhexidine     Port a cath not accessed     Current Medications: Current Outpatient Medications  Medication Sig Dispense Refill  . allopurinol (ZYLOPRIM) 100 MG tablet Take 100 mg by mouth daily.    Marland Kitchen aspirin EC 81 MG tablet Take 81 mg by mouth daily.    Marland Kitchen atorvastatin (LIPITOR) 20 MG tablet Take 20 mg by mouth daily.    . Blood Glucose Monitoring Suppl (ONETOUCH VERIO FLEX SYSTEM) w/Device KIT CHECK BLOOD SUGARS ONCE A DAY    . carvedilol (COREG) 12.5 MG tablet Take 12.5 mg by mouth 2 (two) times daily with a meal.    . cholecalciferol (VITAMIN D3) 25 MCG (1000 UT) tablet Take 2,000 Units by mouth daily.    . ferrous sulfate 325 (65 FE) MG tablet Take 325 mg by mouth 2 (two) times daily before a meal.    . gabapentin (NEURONTIN) 100 MG capsule Take 100 mg by mouth 2 (two) times daily.    Marland Kitchen HYDROcodone-acetaminophen (NORCO) 7.5-325 MG tablet Take 1 tablet by mouth every 8 (eight) hours as needed (pain). 45 tablet 0  . levothyroxine (LEVOXYL) 25 MCG tablet Take 25 mcg by mouth daily before breakfast.    . magic mouthwash w/lidocaine SOLN Take 5 mLs by mouth 4 (four) times daily as needed for mouth pain. 240 mL 0  . Magnesium Oxide 200 MG TABS Take by mouth in the morning and at bedtime.    . metFORMIN (GLUCOPHAGE) 500 MG tablet Take 1,000 mg by mouth 2 (two) times daily.    . mirtazapine (REMERON) 15 MG tablet Take 1 tablet (15 mg total) by mouth at bedtime. 30 tablet 2  . Multiple Vitamins-Minerals (CENTRUM SILVER 50+MEN PO) Take 1 tablet by mouth daily.    Marland Kitchen omeprazole (PRILOSEC) 40 MG capsule Take 40 mg by mouth daily.    . ondansetron (ZOFRAN) 4 MG tablet  Take 1 tablet (4 mg total) by mouth every 4 (four) hours as needed for nausea. 90 tablet 3  . OneTouch Delica Lancets 33G MISC CHECK BLOOD SUGAR EVERY DAY AS DIRECTED.    Marland Kitchen ONETOUCH VERIO test strip daily.    Marland Kitchen OZEMPIC, 1 MG/DOSE, 4 MG/3ML SOPN Inject 1 mg into the skin once a week.    . prochlorperazine (COMPAZINE) 10 MG tablet Take 1 tablet (10 mg total) by mouth every 6 (six) hours as needed for nausea or vomiting. 90 tablet 3  . rOPINIRole (REQUIP) 3 MG tablet Take 3 mg by mouth at bedtime.    . Skin Protectants, Misc. (DIMETHICONE-ZINC OXIDE) cream     . SPS 15 GM/60ML suspension TAKE 60 MLS (15 G TOTAL) BY MOUTH DAILY FOR 5 DAYS.    Marland Kitchen vitamin B-12 (CYANOCOBALAMIN) 1000 MCG tablet Take 1,000 mcg by mouth daily.     No current facility-administered medications for this visit.     ASSESSMENT & PLAN:   Assessment:  1. Stage IV adenocarcinoma with liver metastasis.  He had significant difficulty tolerating FOLFOX /bevacizumab, and we did not feel he could tolerate further aggressive chemotherapy, so he is on palliative care with hospice.  He had been better since discontinuing chemotherapy, but he has declined somewhat in recent weeks with weight loss which is in part due to depression.    2. Iron deficiency anemia, which has stabilized since increasing oral iron supplement to twice daily.  3. Exophytic nodule arising from the right kidney measuring 2.7 cm. This has mildly enlarged since prior study, but has been present for the past 3 years. This measured 2.3 cm on recent CT.  4. 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  He also has pain from his liver metastasis. His pain is controlled with hydrocodone/APAP as needed.  5. Numbness and paresthesias of his fingers, from prior spinal surgery. This remains stable.   6. Hypomagnesemia, resolved with increasing magnesium oxide 400 mg to twice daily. I will have him continue this.  7.  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  I will have him resume mirtazapine 15 mg at bedtime.  If this is not effective, they will contact us.   Plan:  He knows to continue iron and magnesium twice daily. We will plan to see him back in 6 weeks with a CBC, comprehensive metabolic panel and magnesium for continued supportive care.  The patient and his daughter understand the plans discussed today and are in agreement with them.  They know to contact our office if he develops concerns prior to his next appointment.      Marvia Pickles, PA-C

## 2021-03-07 LAB — CEA: CEA: 18.2 ng/mL — ABNORMAL HIGH (ref 0.0–4.7)

## 2021-03-29 ENCOUNTER — Other Ambulatory Visit: Payer: Self-pay

## 2021-03-29 DIAGNOSIS — M549 Dorsalgia, unspecified: Secondary | ICD-10-CM

## 2021-03-29 DIAGNOSIS — C189 Malignant neoplasm of colon, unspecified: Secondary | ICD-10-CM

## 2021-03-29 MED ORDER — HYDROCODONE-ACETAMINOPHEN 7.5-325 MG PO TABS: 1.0000 | ORAL_TABLET | Freq: Three times a day (TID) | ORAL | 0 refills | Status: DC | PRN

## 2021-04-10 ENCOUNTER — Encounter: Payer: Self-pay | Admitting: Oncology

## 2021-04-17 ENCOUNTER — Telehealth: Payer: Self-pay

## 2021-04-17 ENCOUNTER — Other Ambulatory Visit: Payer: Self-pay

## 2021-04-17 NOTE — Telephone Encounter (Signed)
Mellissa P, NP aware.

## 2021-04-19 ENCOUNTER — Encounter: Payer: Self-pay | Admitting: Oncology

## 2021-04-19 NOTE — Telephone Encounter (Signed)
Patient called to scheduled next Appt

## 2021-04-19 NOTE — Telephone Encounter (Signed)
error 

## 2021-05-06 ENCOUNTER — Ambulatory Visit: Payer: Medicare HMO | Admitting: Hematology and Oncology

## 2021-05-06 ENCOUNTER — Inpatient Hospital Stay: Payer: Medicare HMO | Attending: Oncology

## 2021-05-06 NOTE — Progress Notes (Deleted)
[facility]  [address] [place],  [place]  [address] [phone]  Clinic Day:  05/06/2021  Referring physician: Raina Mina., MD   CHIEF COMPLAINT:  CC:  Metastatic colon cancer to liver  Current Treatment:   Supportive care   HISTORY OF PRESENT ILLNESS:  Ralph Lowery Sr. is a 80 y.o. 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  He presented to the [facility] emergency department in September due to abdominal discomfort, melena, hematochezia, anorexia and weight loss.  He had been started on apixaban the week prior by his primary care physician for atrial fibrillation.  However, in 3-4 days the patient started having black stool and bright red blood per rectum.  This medication was held as advised by his primary physician and he was advised to present to the ER. While in the ER he was started on pantoprazole drip due to concern for GI bleed and GI services was consulted.  CTA imaging revealed findings most consistent with metastatic colorectal carcinoma with innumerable hypoattenuating masses throughout the liver, the largest measuring 4.8 cm, and both regional and distal nodal metastatic disease.  There was also a growing exophytic nodule arising from the right kidney measuring 2.7 cm, previously 2.2 cm, concerning for renal cell carcinoma.  There was no evidence of active arterial GI bleeding.  He underwent EGD and total colonoscopy with biopsy on September 25th, which revealed several large nonobstructing malignant appearing masses in the cecum.  Surgical pathology from this procedure confirmed adenocarcinoma with necrosis.  MMR was normal.  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  He has a PIK3A mutation, but targeted therapy for this mutation has not been approved in colon cancer. Iron studies were notable for iron deficiency with an iron level of 12.0 with a TIBC of 242 for a  percent saturation of 4.9%.  The patient was given IV Feraheme and started on oral supplement 325 mg BID. His iron supplement was subsequently increased to TID.   He received his first cycle of palliative FOLFOX/bevacizumab in October.  As he has continued to lose weight, he was placed on mirtazapine 7.5 mg at bedtime to improve his appetite, this was subsequently increased to 15 mg at bedtime.  The CEA has steadily decreased from 57.2 down to 25.2 in November and 12.9 in December.   CT abdomen and pelvis in January revealed a mixed response with an increase in the porta hepatis lymph nodes from previous and new small bilateral pleural effusions right greater than left.  The liver metastasis was fairly stable.  There was a decrease in the cecal mass from previous. The exophytic right kidney nodule measured approximately 2.3 cm, which was stable to slightly improved.  A chest x-ray revealed a new mild opacity in the medial right lung base felt to be atelectasis versus infiltrate. He was hospitalized for profound hypoglycemia with a blood sugar of 21 in January.  He was found to have hyperkalemia in January and Dr. Bea Graff ordered sorbitol. As he tolerated his last cycle of chemotherapy in December very poorly and he was only having a mixed response, we did not feel he could tolerate further aggressive therapy and recommended hospice care.  He had been doing better since discontinuation of chemotherapy. At his visit in April, he was more depressed, but had stopped escitalopram due to cost.  He had previously been on mirtazapine for depression, sleep and appetite, but also  discontinued this.  INTERVAL HISTORY:  Ralph Griffin is here today for repeat clinical assessment. He denies fevers or chills. He denies pain. His appetite is good. His weight has decreased 20 pounds over last 6 weeks .  REVIEW OF SYSTEMS:  Review of Systems  Constitutional:  Negative for appetite change, chills, fatigue, fever and unexpected  weight change.  HENT:   Negative for lump/mass, mouth sores and sore throat.   Respiratory:  Negative for cough and shortness of breath.   Cardiovascular:  Negative for chest pain and leg swelling.  Gastrointestinal:  Negative for abdominal pain, constipation, diarrhea, nausea and vomiting.  Genitourinary:  Negative for difficulty urinating, dysuria, frequency and hematuria.   Musculoskeletal:  Negative for arthralgias, back pain and myalgias.  Skin:  Negative for itching, rash and wound.  Neurological:  Negative for dizziness, extremity weakness, headaches, light-headedness and numbness.  Hematological:  Negative for adenopathy.  Psychiatric/Behavioral:  Negative for depression and sleep disturbance. The patient is not nervous/anxious.     VITALS:  There were no vitals taken for this visit.  Wt Readings from Last 3 Encounters:  03/06/21 225 lb 11.2 oz (102.4 kg)  01/23/21 244 lb (110.7 kg)  01/08/21 240 lb (108.9 kg)    There is no height or weight on file to calculate BMI.  Performance status (ECOG): 2 - Symptomatic, <50% confined to bed  PHYSICAL EXAM:  Physical Exam Vitals and nursing note reviewed.  Constitutional:      General: He is not in acute distress.    Appearance: Normal appearance. He is normal weight.  HENT:     Head: Normocephalic and atraumatic.     Mouth/Throat:     Mouth: Mucous membranes are moist.     Pharynx: Oropharynx is clear. No oropharyngeal exudate or posterior oropharyngeal erythema.  Eyes:     General: No scleral icterus.    Extraocular Movements: Extraocular movements intact.     Conjunctiva/sclera: Conjunctivae normal.     Pupils: Pupils are equal, round, and reactive to light.  Cardiovascular:     Rate and Rhythm: Normal rate and regular rhythm.     Heart sounds: Normal heart sounds. No murmur heard.   No friction rub. No gallop.  Pulmonary:     Effort: Pulmonary effort is normal.     Breath sounds: Normal breath sounds. No wheezing,  rhonchi or rales.  Chest:  Breasts:    Right: No axillary adenopathy or supraclavicular adenopathy.     Left: No axillary adenopathy or supraclavicular adenopathy.  Abdominal:     General: Bowel sounds are normal. There is no distension.     Palpations: Abdomen is soft. There is no splenomegaly or mass.     Tenderness: There is no abdominal tenderness.  Musculoskeletal:        General: Normal range of motion.     Cervical back: Normal range of motion and neck supple. No tenderness.     Right lower leg: No edema.     Left lower leg: No edema.  Lymphadenopathy:     Cervical: No cervical adenopathy.     Upper Body:     Right upper body: No supraclavicular or axillary adenopathy.     Left upper body: No supraclavicular or axillary adenopathy.     Lower Body: No right inguinal adenopathy. No left inguinal adenopathy.  Skin:    General: Skin is warm and dry.     Coloration: Skin is not jaundiced.     Findings: No rash.  Neurological:  Mental Status: He is alert and oriented to person, place, and time.     Cranial Nerves: No cranial nerve deficit.  Psychiatric:        Attention and Perception: Attention normal.        Mood and Affect: Mood is depressed.        Behavior: Behavior normal.        Thought Content: Thought content normal.   LABS:   CBC Latest Ref Rng & Units 03/06/2021 01/23/2021 01/02/2021  WBC - 6.1 6.1 5.3  Hemoglobin 13.5 - 17.5 10.5(A) 10.5(A) 11.2(A)  Hematocrit 41 - 53 33(A) 31(A) 35(A)  Platelets 150 - 399 313 289 250   CMP Latest Ref Rng & Units 03/06/2021 01/23/2021 01/02/2021  Glucose 70 - 99 mg/dL - - -  BUN 4 - 21 24(A) 19 16  Creatinine 0.6 - 1.3 0.9 0.8 0.8  Sodium 137 - 147 133(A) 135(A) 138  Potassium 3.4 - 5.3 4.3 4.1 4.5  Chloride 99 - 108 102 101 107  CO2 13 - 22 26(A) 27(A) 23(A)  Calcium 8.7 - 10.7 8.9 8.6(A) 8.5(A)  Total Protein 6.5 - 8.1 g/dL - - -  Total Bilirubin 0.3 - 1.2 mg/dL - - -  Alkaline Phos 25 - 125 673(A) 413(A) 307(A)  AST  14 - 40 42(A) 41(A) 32  ALT 10 - 40 _0 Lab Results  Component Value Date   CEA1 18.2 (H) 03/06/2021   /  CEA  Date Value Ref Range Status  03/06/2021 18.2 (H) 0.0 - 4.7 ng/mL Final    Comment:    (NOTE)                             Nonsmokers          <3.9                             Smokers             <5.6 Roche [employer] Electrochemiluminescence Immunoassay (ECLIA) Values obtained with different assay methods or kits cannot be used interchangeably.  Results cannot be interpreted as absolute evidence of the presence or absence of malignant disease. Performed At: [facility] [place], [place] [id number] Rush Farmer MD FT:[id number]    No results found for: PSA1 No results found for: CAN199 No results found for: CAN125  No results found for: TOTALPROTELP, ALBUMINELP, A1GS, A2GS, BETS, BETA2SER, GAMS, MSPIKE, SPEI No results found for: TIBC, FERRITIN, IRONPCTSAT No results found for: LDH  STUDIES:  No results found.    HISTORY:   Past Medical History:  Diagnosis Date  . Arthritis   . Atrial fibrillation ([place])   . Diabetes mellitus without complication ([place])   . History of gout   . Hyperlipidemia   . Hypertension     Past Surgical History:  Procedure Laterality Date  . CERVICAL DISCECTOMY    . CHOLECYSTECTOMY    . POSTERIOR CERVICAL LAMINECTOMY N/A 12/27/2018   Procedure: Decompressive Cervical Laminectomy and Posterior Cervical Fusion Cervical three-four, four-five, five-six;  Surgeon: Kary Kos, MD;  Location: [facility];  Service: Neurosurgery;  Laterality: N/A;  . TONSILLECTOMY AND ADENOIDECTOMY      Family History  Problem Relation Age of Onset  . Pancreatic cancer Mother   . Colon cancer Niece     Social History:  reports that he  has quit smoking. He has never used smokeless tobacco. He reports previous alcohol use. He reports that he does not use drugs.The patient is accompanied by his daughter today.  Allergies:   Allergies  Allergen Reactions  . Chlorhexidine     Port a cath not accessed     Current Medications: Current Outpatient Medications  Medication Sig Dispense Refill  . allopurinol (ZYLOPRIM) 100 MG tablet Take 100 mg by mouth daily.    Marland Kitchen aspirin EC 81 MG tablet Take 81 mg by mouth daily.    Marland Kitchen atorvastatin (LIPITOR) 20 MG tablet Take 20 mg by mouth daily.    . Blood Glucose Monitoring Suppl (ONETOUCH VERIO FLEX SYSTEM) w/Device KIT CHECK BLOOD SUGARS ONCE A DAY    . cholecalciferol (VITAMIN D3) 25 MCG (1000 UT) tablet Take 2,000 Units by mouth daily.    . ferrous sulfate 325 (65 FE) MG tablet Take 325 mg by mouth 2 (two) times daily before a meal.    . gabapentin (NEURONTIN) 100 MG capsule Take 100 mg by mouth 2 (two) times daily.    Marland Kitchen HYDROcodone-acetaminophen (NORCO) 7.5-325 MG tablet Take 1 tablet by mouth every 8 (eight) hours as needed (pain). 45 tablet 0  . levothyroxine (LEVOXYL) 25 MCG tablet Take 25 mcg by mouth daily before breakfast.    . magic mouthwash w/lidocaine SOLN Take 5 mLs by mouth 4 (four) times daily as needed for mouth pain. 240 mL 0  . Magnesium Oxide 200 MG TABS Take by mouth in the morning and at bedtime.    . metFORMIN (GLUCOPHAGE) 500 MG tablet Take 1,000 mg by mouth 2 (two) times daily.    . mirtazapine (REMERON) 15 MG tablet Take 1 tablet (15 mg total) by mouth at bedtime. 30 tablet 2  . Multiple Vitamins-Minerals (CENTRUM SILVER 50+MEN PO) Take 1 tablet by mouth daily.    Marland Kitchen omeprazole (PRILOSEC) 40 MG capsule Take 40 mg by mouth daily.    . ondansetron (ZOFRAN) 4 MG tablet Take 1 tablet (4 mg total) by mouth every 4 (four) hours as needed for nausea. 90 tablet 3  . OneTouch Delica Lancets 09X MISC CHECK BLOOD SUGAR EVERY DAY AS DIRECTED.    Marland Kitchen ONETOUCH VERIO test strip daily.    Marland Kitchen OZEMPIC, 1 MG/DOSE, 4 MG/3ML SOPN Inject 1 mg into the skin once a week.    . prochlorperazine (COMPAZINE) 10 MG tablet Take 1 tablet (10 mg total) by mouth every 6 (six) hours as  needed for nausea or vomiting. 90 tablet 3  . rOPINIRole (REQUIP) 3 MG tablet Take 3 mg by mouth at bedtime.    . Skin Protectants, Misc. (DIMETHICONE-ZINC OXIDE) cream     . SPS 15 GM/60ML suspension TAKE 60 MLS (15 G TOTAL) BY MOUTH DAILY FOR 5 DAYS.    Marland Kitchen vitamin B-12 (CYANOCOBALAMIN) 1000 MCG tablet Take 1,000 mcg by mouth daily.     No current facility-administered medications for this visit.     ASSESSMENT & PLAN:   Assessment:  1. Stage IV adenocarcinoma with liver metastasis.  He had significant difficulty tolerating FOLFOX /bevacizumab, and we did not feel he could tolerate further aggressive chemotherapy, so he is on palliative care with hospice.  He had been better since discontinuing chemotherapy, but he has declined somewhat in recent weeks with weight loss which is in part due to depression.     2. Iron deficiency anemia, which has stabilized since increasing oral iron supplement to twice daily.   3.  Exophytic nodule arising from the right kidney measuring 2.7 cm.  This has mildly enlarged since prior study, but has been present for the past 3 years.  This measured 2.3 cm on recent CT.   4. 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  He also has pain from his liver metastasis. His pain is controlled with hydrocodone/APAP as needed.   5. Numbness and paresthesias of his fingers, from prior spinal surgery.  This remains stable.    6. Hypomagnesemia, resolved with increasing magnesium oxide 400 mg to twice daily. I will have him continue this.  7. 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  I will have him resume mirtazapine 15 mg at bedtime.  If this is not effective, they will contact us.   Plan:  He knows to continue iron and magnesium twice daily. We will plan to see him back in 6 weeks with a CBC, comprehensive metabolic panel and magnesium for continued supportive care.  The patient and his daughter understand the plans  discussed today and are in agreement with them.  They know to contact our office if he develops concerns prior to his next appointment.      Marvia Pickles, PA-C

## 2021-05-17 ENCOUNTER — Other Ambulatory Visit: Payer: Self-pay

## 2021-05-17 DIAGNOSIS — C189 Malignant neoplasm of colon, unspecified: Secondary | ICD-10-CM

## 2021-05-17 DIAGNOSIS — M549 Dorsalgia, unspecified: Secondary | ICD-10-CM

## 2021-05-17 MED ORDER — HYDROCODONE-ACETAMINOPHEN 7.5-325 MG PO TABS
1.0000 | ORAL_TABLET | Freq: Three times a day (TID) | ORAL | 0 refills | Status: DC | PRN
Start: 2021-05-17 — End: 2021-08-15

## 2021-06-06 ENCOUNTER — Telehealth: Payer: Self-pay

## 2021-06-06 NOTE — Telephone Encounter (Signed)
Chyrl with Hospice called regarding patients medications and some issues she thought we needed to know about. She states that patient is taking the Allopurinol 100 mg daily. She thinks that patient is starting a gout flare up on the left foot. It is red, tender, and puffy. Wants to know if there is anything else he needs for this issue. He also is continuously losing weight, eats 2 meals a day with occasional snacks, jaundice, has a foley catheter in place. Phone number for hospice nurse is [phone].

## 2021-06-11 ENCOUNTER — Telehealth: Payer: Self-pay

## 2021-06-11 ENCOUNTER — Other Ambulatory Visit: Payer: Self-pay | Admitting: Hematology and Oncology

## 2021-06-11 MED ORDER — COLCHICINE 0.6 MG PO TABS: 0.6000 mg | ORAL_TABLET | Freq: Two times a day (BID) | ORAL | 0 refills | Status: AC

## 2021-06-11 NOTE — Progress Notes (Unsigned)
oci

## 2021-06-11 NOTE — Telephone Encounter (Addendum)
Ralph Griffin-RN - I spoke with Cheryl,RN. I gave her the Colchicine 0.'6mg'$  po BID until symptoms resolve. Pt to also stop atorvastatin while taking Colchicine due to drug interactions. She read back & verified order given by Ralph Griffin.   Message Received: Today Ralph Griffin, Ralph Bloodgood, PA-C  Ralph Hassell W, RN Caller: Unspecified (Today, 12:34 PM) Sent in colchicine 0.'6mg'$  bid until flare resolved since it has been a week.Please have him stop atorvastatin due to interaction with colchicine. Thanks    Ralph Steckman,RN - Pt has been taking the Allopurinol daily.

## 2021-07-23 ENCOUNTER — Other Ambulatory Visit: Payer: Self-pay | Admitting: Hematology and Oncology

## 2021-07-23 DIAGNOSIS — R630 Anorexia: Secondary | ICD-10-CM

## 2021-07-23 DIAGNOSIS — G4700 Insomnia, unspecified: Secondary | ICD-10-CM

## 2021-07-24 ENCOUNTER — Encounter: Payer: Self-pay | Admitting: Oncology

## 2021-08-13 ENCOUNTER — Encounter: Payer: Self-pay | Admitting: Oncology

## 2021-08-15 ENCOUNTER — Other Ambulatory Visit: Payer: Self-pay | Admitting: Hematology and Oncology

## 2021-08-15 DIAGNOSIS — M549 Dorsalgia, unspecified: Secondary | ICD-10-CM

## 2021-08-15 DIAGNOSIS — C189 Malignant neoplasm of colon, unspecified: Secondary | ICD-10-CM

## 2021-08-15 MED ORDER — HYDROCODONE-ACETAMINOPHEN 7.5-325 MG PO TABS: 1.0000 | ORAL_TABLET | Freq: Three times a day (TID) | ORAL | 0 refills | Status: DC | PRN

## 2021-08-16 ENCOUNTER — Other Ambulatory Visit: Payer: Self-pay | Admitting: Hematology and Oncology

## 2021-08-16 DIAGNOSIS — R630 Anorexia: Secondary | ICD-10-CM

## 2021-08-19 ENCOUNTER — Encounter: Payer: Self-pay | Admitting: Oncology

## 2021-08-23 ENCOUNTER — Telehealth: Payer: Self-pay

## 2021-08-23 NOTE — Telephone Encounter (Addendum)
Michelle,RN, notified. ----- Message from Derwood Kaplan, MD sent at 08/23/2021  1:05 PM EDT ----- Regarding: RE: Flu vaccine Yes (for any of our hospice pats) ----- Message ----- From: Dairl Ponder, RN Sent: 08/23/2021   8:29 AM EDT To: Derwood Kaplan, MD Subject: Flu vaccine                                    Hospice nurse, Sharyn Lull, RN, is requesting order for flu vaccine per pt request.

## 2021-08-29 ENCOUNTER — Other Ambulatory Visit: Payer: Self-pay

## 2021-08-29 DIAGNOSIS — K219 Gastro-esophageal reflux disease without esophagitis: Secondary | ICD-10-CM

## 2021-08-29 MED ORDER — OMEPRAZOLE 40 MG PO CPDR
40.0000 mg | DELAYED_RELEASE_CAPSULE | Freq: Every day | ORAL | 1 refills | Status: DC
Start: 2021-08-29 — End: 2021-10-10

## 2021-09-05 ENCOUNTER — Telehealth: Payer: Self-pay

## 2021-09-05 ENCOUNTER — Other Ambulatory Visit: Payer: Self-pay | Admitting: Hematology and Oncology

## 2021-09-05 DIAGNOSIS — R5383 Other fatigue: Secondary | ICD-10-CM

## 2021-09-05 DIAGNOSIS — R630 Anorexia: Secondary | ICD-10-CM

## 2021-09-05 NOTE — Telephone Encounter (Addendum)
RE: Dexamethasone script,&  somthing for edema Received: Today Mosher, Beryle Flock, RN Done        Previous Messages   ----- Message -----  From: Dairl Ponder, RN  Sent: 09/06/2021   9:04 AM EDT  To: Marvia Pickles, PA-C  Subject: RE: Dexamethasone script,&  somthing for ede*   Yes maam  ----- Message -----  From: Marvia Pickles, PA-C  Sent: 09/05/2021   5:23 PM EDT  To: Dairl Ponder, RN  Subject: RE: Dexamethasone script,&  somthing for ede*   OK, do I need to send in a script for the higher dose?    Davit Vassar,RN:  notified Marie,RN, of below. She will have him increase the mirtzapine to 30g @ HS.   RE: Dexamethasone script,&  somthing for edema Received: Today Mosher, Thalia Bloodgood, PA-C  Keyshon Stein W, RN Dexamethasone can make swelling worse. Is he taking the mirtazapine I prescribed in April for appetite. If not, I would resume mirtazapine 15mg  daily, if he is, I would increase to 30mg  daily. I think we have discussed before that swelling can be due to low albumin-can't say for sure as patient has not been in to see [place]. I was scheduled to see him in June but he no showed. I assume they are having him elevate his feet above his heart, watching salt in diet. Can use use ace wraps. Compression hose would be good if they can get the swelling down enough. Thanks   Karol Liendo,RNLeilani Able, Hospice nurse, requesting some dexamethasone for fatigue. Would also like your thought about the edema in bilateral edema in feet.

## 2021-09-06 ENCOUNTER — Encounter: Payer: Self-pay | Admitting: Oncology

## 2021-09-06 ENCOUNTER — Other Ambulatory Visit: Payer: Self-pay | Admitting: Hematology and Oncology

## 2021-09-06 MED ORDER — MIRTAZAPINE 30 MG PO TABS: 30.0000 mg | ORAL_TABLET | Freq: Every day | ORAL | 1 refills | Status: DC

## 2021-09-16 ENCOUNTER — Telehealth: Payer: Self-pay

## 2021-09-16 NOTE — Telephone Encounter (Addendum)
@   1338 - I called pt's daughter back and notified her of Dr Remi Deter recommendation to try the Paxlovid, but to stop it if he starts having a lot of side effects. Pt's daughter has decided to hold off on giving him the Paxlovid for now. She will try Robitussin for cough, and tylenol PRN.   @ 1336- I spoke with Leilani Able, Hospice Nurse to inquire of DNR. She states, "Pt had one, but cant find it. She is going by office to get one. Family told her Dr Bea Graff was going to mail one to them, but they haven't received it. I also told Marie,RN, Dr Remi Deter response below.    RE: Tested positive for COVID (by family) Received: Today Ralph Kaplan, MD  Dairl Ponder, RN They could try the Paxlovid but if he gets a lot of side effects I would stop it.  He is someone who may not survive this, does he have DNR? Would just treat the symptoms and hope for the best.         Pt's daughter,Ralph Griffin, has called to make [place] aware that he tested positive for COVID (in home test). His daughter has a prescription of the Paxlovid that her Dr sent in for her, but she never used. She wondered if they could give him that or anything else?"He is weak and has been in bed since Friday. I had to call EMS to help me get him up. He has a cough, some wheezing and his oxygen is 95%". Temp 99.5 yesterday.

## 2021-09-18 ENCOUNTER — Other Ambulatory Visit: Payer: Self-pay

## 2021-09-18 DIAGNOSIS — C189 Malignant neoplasm of colon, unspecified: Secondary | ICD-10-CM

## 2021-09-18 DIAGNOSIS — M549 Dorsalgia, unspecified: Secondary | ICD-10-CM

## 2021-09-18 MED ORDER — HYDROCODONE-ACETAMINOPHEN 7.5-325 MG PO TABS
1.0000 | ORAL_TABLET | Freq: Three times a day (TID) | ORAL | 0 refills | Status: DC | PRN
Start: 2021-09-18 — End: 2021-10-11

## 2021-09-26 ENCOUNTER — Other Ambulatory Visit: Payer: Self-pay | Admitting: Oncology

## 2021-09-26 DIAGNOSIS — K5900 Constipation, unspecified: Secondary | ICD-10-CM

## 2021-09-26 DIAGNOSIS — J209 Acute bronchitis, unspecified: Secondary | ICD-10-CM

## 2021-09-26 MED ORDER — CIPROFLOXACIN HCL 500 MG PO TABS: 500.0000 mg | ORAL_TABLET | Freq: Two times a day (BID) | ORAL | 0 refills | Status: AC

## 2021-09-26 MED ORDER — BISACODYL 10 MG RE SUPP
10.0000 mg | RECTAL | 0 refills | Status: AC | PRN
Start: 2021-09-26 — End: ?

## 2021-09-27 ENCOUNTER — Telehealth: Payer: Self-pay

## 2021-09-27 ENCOUNTER — Other Ambulatory Visit: Payer: Self-pay | Admitting: Oncology

## 2021-09-27 DIAGNOSIS — J209 Acute bronchitis, unspecified: Secondary | ICD-10-CM

## 2021-09-27 MED ORDER — BENZONATATE 100 MG PO CAPS: 100.0000 mg | ORAL_CAPSULE | Freq: Three times a day (TID) | ORAL | 5 refills | Status: AC | PRN

## 2021-09-27 NOTE — Telephone Encounter (Addendum)
I called Marie,RN, gave her the orders below. She RBV orders correctly.  ----- Message from Derwood Kaplan, MD sent at 09/26/2021  5:13 PM EST ----- Regarding: RE: HOSPICE requests Sent in Cipro 500 bid x 10 days & Dulcolax #12 (comes in package of 12) ----- Message ----- From: Dairl Ponder, RN Sent: 09/26/2021   3:35 PM EST To: Derwood Kaplan, MD Subject: [facility] requests                               Received call from [facility], @ [facility]. 1.  Pt has productive cough- yellow phlegm. He is unable to sleep due to cough. He has tried Robitussin and Mucinex. Afebrile. No wheezing.  2. He also needs prescription for dulcolax suppository 1 supp every 3 days PRN constipation. Qty 10  He uses the [facility]

## 2021-09-27 NOTE — Telephone Encounter (Addendum)
Marie,RN, notified of order.   ----- Message from Derwood Kaplan, MD sent at 09/27/2021 12:45 PM EST ----- Regarding: RE: [place] for done ----- Message ----- From: Dairl Ponder, RN Sent: 09/27/2021  12:05 PM EST To: Derwood Kaplan, MD Subject: Hospice req tessalon perales for               [facility] nurse, called and requested Tessalon pearles be ordered

## 2021-10-02 ENCOUNTER — Other Ambulatory Visit: Payer: Self-pay

## 2021-10-02 DIAGNOSIS — B372 Candidiasis of skin and nail: Secondary | ICD-10-CM

## 2021-10-02 MED ORDER — NYSTATIN-TRIAMCINOLONE 100000-0.1 UNIT/GM-% EX OINT: 1.0000 "application " | TOPICAL_OINTMENT | Freq: Two times a day (BID) | CUTANEOUS | 2 refills | Status: AC

## 2021-10-03 ENCOUNTER — Other Ambulatory Visit: Payer: Self-pay | Admitting: Hematology and Oncology

## 2021-10-08 ENCOUNTER — Other Ambulatory Visit: Payer: Self-pay

## 2021-10-08 MED ORDER — MAGIC MOUTHWASH W/LIDOCAINE: 5.0000 mL | Freq: Four times a day (QID) | ORAL | 1 refills | Status: DC | PRN

## 2021-10-10 ENCOUNTER — Other Ambulatory Visit: Payer: Self-pay | Admitting: Hematology and Oncology

## 2021-10-10 ENCOUNTER — Other Ambulatory Visit: Payer: Self-pay | Admitting: Oncology

## 2021-10-10 DIAGNOSIS — K219 Gastro-esophageal reflux disease without esophagitis: Secondary | ICD-10-CM

## 2021-10-10 MED ORDER — ALBUTEROL SULFATE (2.5 MG/3ML) 0.083% IN NEBU: 2.5000 mg | INHALATION_SOLUTION | Freq: Four times a day (QID) | RESPIRATORY_TRACT | 12 refills | Status: AC | PRN

## 2021-10-10 MED ORDER — MIRTAZAPINE 30 MG PO TABS: 30.0000 mg | ORAL_TABLET | Freq: Every day | ORAL | 1 refills | Status: DC

## 2021-10-10 MED ORDER — MAGIC MOUTHWASH W/LIDOCAINE: 5.0000 mL | Freq: Four times a day (QID) | ORAL | 1 refills | Status: DC | PRN

## 2021-10-10 MED ORDER — MAGIC MOUTHWASH W/LIDOCAINE: 5.0000 mL | Freq: Four times a day (QID) | ORAL | 1 refills | Status: AC | PRN

## 2021-10-11 ENCOUNTER — Other Ambulatory Visit: Payer: Self-pay | Admitting: Hematology and Oncology

## 2021-10-11 MED ORDER — MORPHINE SULFATE (CONCENTRATE) 20 MG/ML PO SOLN: 5.0000 mg | ORAL | 0 refills | Status: AC | PRN

## 2021-10-11 NOTE — Progress Notes (Signed)
Call from [facility] with [facility], [phone]. Patient now jaundice, having increased abdominal distension/pain and shortness of breath. Currently on hydrocodone/apap 7.5/325. Requests stronger pain medication. Recommend Roxanol 0.25 mL to 5 mL every 4 hours as needed for pain or shortness of breath.

## 2021-10-18 ENCOUNTER — Telehealth: Payer: Self-pay

## 2021-10-18 NOTE — Telephone Encounter (Signed)
-----   Message from Derwood Kaplan, MD sent at 10/17/2021  6:46 PM EST ----- Regarding: hospice Expired 11-07-2023, need to know time for death cert.

## 2021-10-18 NOTE — Telephone Encounter (Signed)
Patient died: 03-Nov-2021 at 12:40 pm per Fraser Din at [facility].

## 2021-11-10 DEATH — deceased

## 2023-07-07 ENCOUNTER — Encounter: Payer: Self-pay | Admitting: *Deleted
